# Patient Record
Sex: Female | Born: 1965 | Race: White | Hispanic: No | Marital: Single | Smoking: Former smoker
Health system: Southern US, Community
[De-identification: ages and names within clinical notes are randomized; demographics above are authoritative.]

## PROBLEM LIST (undated history)

## (undated) DIAGNOSIS — E785 Hyperlipidemia, unspecified: Secondary | ICD-10-CM

## (undated) DIAGNOSIS — IMO0001 Reserved for inherently not codable concepts without codable children: Secondary | ICD-10-CM

## (undated) DIAGNOSIS — M5412 Radiculopathy, cervical region: Secondary | ICD-10-CM

## (undated) DIAGNOSIS — K759 Inflammatory liver disease, unspecified: Secondary | ICD-10-CM

## (undated) DIAGNOSIS — E119 Type 2 diabetes mellitus without complications: Secondary | ICD-10-CM

## (undated) DIAGNOSIS — G510 Bell's palsy: Secondary | ICD-10-CM

## (undated) DIAGNOSIS — Z9081 Acquired absence of spleen: Secondary | ICD-10-CM

## (undated) DIAGNOSIS — F319 Bipolar disorder, unspecified: Secondary | ICD-10-CM

## (undated) DIAGNOSIS — G5603 Carpal tunnel syndrome, bilateral upper limbs: Secondary | ICD-10-CM

## (undated) DIAGNOSIS — Z972 Presence of dental prosthetic device (complete) (partial): Secondary | ICD-10-CM

## (undated) DIAGNOSIS — F32A Depression, unspecified: Secondary | ICD-10-CM

## (undated) DIAGNOSIS — J449 Chronic obstructive pulmonary disease, unspecified: Secondary | ICD-10-CM

## (undated) DIAGNOSIS — D649 Anemia, unspecified: Secondary | ICD-10-CM

## (undated) DIAGNOSIS — Z87442 Personal history of urinary calculi: Secondary | ICD-10-CM

## (undated) DIAGNOSIS — Z22322 Carrier or suspected carrier of Methicillin resistant Staphylococcus aureus: Secondary | ICD-10-CM

## (undated) DIAGNOSIS — G039 Meningitis, unspecified: Secondary | ICD-10-CM

## (undated) DIAGNOSIS — M542 Cervicalgia: Secondary | ICD-10-CM

## (undated) DIAGNOSIS — K219 Gastro-esophageal reflux disease without esophagitis: Secondary | ICD-10-CM

## (undated) DIAGNOSIS — F329 Major depressive disorder, single episode, unspecified: Secondary | ICD-10-CM

## (undated) DIAGNOSIS — F410 Panic disorder [episodic paroxysmal anxiety] without agoraphobia: Secondary | ICD-10-CM

## (undated) DIAGNOSIS — T8859XA Other complications of anesthesia, initial encounter: Secondary | ICD-10-CM

## (undated) DIAGNOSIS — E7800 Pure hypercholesterolemia, unspecified: Secondary | ICD-10-CM

## (undated) DIAGNOSIS — F419 Anxiety disorder, unspecified: Secondary | ICD-10-CM

## (undated) DIAGNOSIS — E079 Disorder of thyroid, unspecified: Secondary | ICD-10-CM

## (undated) DIAGNOSIS — M5430 Sciatica, unspecified side: Secondary | ICD-10-CM

## (undated) DIAGNOSIS — G8929 Other chronic pain: Secondary | ICD-10-CM

## (undated) DIAGNOSIS — G43909 Migraine, unspecified, not intractable, without status migrainosus: Secondary | ICD-10-CM

## (undated) DIAGNOSIS — M1990 Unspecified osteoarthritis, unspecified site: Secondary | ICD-10-CM

## (undated) DIAGNOSIS — N939 Abnormal uterine and vaginal bleeding, unspecified: Secondary | ICD-10-CM

## (undated) DIAGNOSIS — Q780 Osteogenesis imperfecta: Secondary | ICD-10-CM

## (undated) DIAGNOSIS — E039 Hypothyroidism, unspecified: Secondary | ICD-10-CM

## (undated) DIAGNOSIS — R011 Cardiac murmur, unspecified: Secondary | ICD-10-CM

## (undated) HISTORY — DX: Disorder of thyroid, unspecified: E07.9

## (undated) HISTORY — DX: Cervicalgia: M54.2

## (undated) HISTORY — DX: Pure hypercholesterolemia, unspecified: E78.00

## (undated) HISTORY — PX: OTHER SURGICAL HISTORY: SHX169

## (undated) HISTORY — DX: Osteogenesis imperfecta: Q78.0

## (undated) HISTORY — PX: TUBAL LIGATION: SHX77

## (undated) HISTORY — DX: Anxiety disorder, unspecified: F41.9

## (undated) HISTORY — DX: Carrier or suspected carrier of methicillin resistant Staphylococcus aureus: Z22.322

## (undated) HISTORY — DX: Abnormal uterine and vaginal bleeding, unspecified: N93.9

## (undated) HISTORY — PX: ABDOMINAL HYSTERECTOMY: SHX81

---
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
## 2007-12-27 HISTORY — PX: ANTERIOR FUSION CERVICAL SPINE: SUR626

## 2007-12-27 HISTORY — PX: NECK SURGERY: SHX720

## 2008-04-20 ENCOUNTER — Emergency Department: Payer: Self-pay | Admitting: Emergency Medicine

## 2008-06-17 ENCOUNTER — Emergency Department: Payer: Self-pay | Admitting: Internal Medicine

## 2009-03-10 ENCOUNTER — Emergency Department: Payer: Self-pay | Admitting: Internal Medicine

## 2009-05-03 ENCOUNTER — Emergency Department: Payer: Self-pay | Admitting: Unknown Physician Specialty

## 2009-11-19 ENCOUNTER — Emergency Department: Payer: Self-pay | Admitting: Emergency Medicine

## 2010-02-04 ENCOUNTER — Emergency Department: Payer: Self-pay | Admitting: Emergency Medicine

## 2010-02-07 ENCOUNTER — Inpatient Hospital Stay: Payer: Self-pay | Admitting: Internal Medicine

## 2010-07-01 ENCOUNTER — Inpatient Hospital Stay (HOSPITAL_COMMUNITY): Admission: AD | Admit: 2010-07-01 | Discharge: 2010-07-01 | Payer: Self-pay | Admitting: Obstetrics & Gynecology

## 2010-07-01 ENCOUNTER — Ambulatory Visit: Payer: Self-pay | Admitting: Gynecology

## 2010-10-09 ENCOUNTER — Emergency Department: Payer: Self-pay | Admitting: Unknown Physician Specialty

## 2011-01-27 ENCOUNTER — Emergency Department: Payer: Self-pay | Admitting: Internal Medicine

## 2011-03-13 LAB — URINE MICROSCOPIC-ADD ON

## 2011-03-13 LAB — URINALYSIS, ROUTINE W REFLEX MICROSCOPIC
Ketones, ur: NEGATIVE mg/dL
Leukocytes, UA: NEGATIVE
Nitrite: NEGATIVE
Specific Gravity, Urine: 1.02 (ref 1.005–1.030)
pH: 7 (ref 5.0–8.0)

## 2011-03-13 LAB — WET PREP, GENITAL

## 2011-03-13 LAB — GC/CHLAMYDIA PROBE AMP, GENITAL: GC Probe Amp, Genital: NEGATIVE

## 2011-07-31 ENCOUNTER — Emergency Department: Payer: Self-pay | Admitting: Emergency Medicine

## 2011-09-09 ENCOUNTER — Emergency Department (HOSPITAL_COMMUNITY): Payer: Self-pay

## 2011-09-09 ENCOUNTER — Emergency Department (HOSPITAL_COMMUNITY)
Admission: EM | Admit: 2011-09-09 | Discharge: 2011-09-09 | Disposition: A | Discharge status: Home / Self Care | Payer: Self-pay | Attending: Emergency Medicine | Admitting: Emergency Medicine

## 2011-09-09 DIAGNOSIS — M7989 Other specified soft tissue disorders: Secondary | ICD-10-CM | POA: Insufficient documentation

## 2011-09-09 DIAGNOSIS — S1093XA Contusion of unspecified part of neck, initial encounter: Secondary | ICD-10-CM | POA: Insufficient documentation

## 2011-09-09 DIAGNOSIS — Z79899 Other long term (current) drug therapy: Secondary | ICD-10-CM | POA: Insufficient documentation

## 2011-09-09 DIAGNOSIS — S0003XA Contusion of scalp, initial encounter: Secondary | ICD-10-CM | POA: Insufficient documentation

## 2011-09-09 DIAGNOSIS — S40029A Contusion of unspecified upper arm, initial encounter: Secondary | ICD-10-CM | POA: Insufficient documentation

## 2011-09-09 DIAGNOSIS — M79609 Pain in unspecified limb: Secondary | ICD-10-CM | POA: Insufficient documentation

## 2011-09-17 ENCOUNTER — Emergency Department (HOSPITAL_COMMUNITY): Payer: Self-pay

## 2011-09-17 ENCOUNTER — Emergency Department (HOSPITAL_COMMUNITY)
Admission: EM | Admit: 2011-09-17 | Discharge: 2011-09-17 | Disposition: A | Discharge status: Home / Self Care | Payer: Self-pay | Attending: Emergency Medicine | Admitting: Emergency Medicine

## 2011-09-17 DIAGNOSIS — F4310 Post-traumatic stress disorder, unspecified: Secondary | ICD-10-CM | POA: Insufficient documentation

## 2011-09-17 DIAGNOSIS — M542 Cervicalgia: Secondary | ICD-10-CM | POA: Insufficient documentation

## 2011-09-17 DIAGNOSIS — M5412 Radiculopathy, cervical region: Secondary | ICD-10-CM | POA: Insufficient documentation

## 2011-09-17 DIAGNOSIS — Z79899 Other long term (current) drug therapy: Secondary | ICD-10-CM | POA: Insufficient documentation

## 2011-09-17 DIAGNOSIS — M25529 Pain in unspecified elbow: Secondary | ICD-10-CM | POA: Insufficient documentation

## 2011-09-17 DIAGNOSIS — F319 Bipolar disorder, unspecified: Secondary | ICD-10-CM | POA: Insufficient documentation

## 2011-10-01 ENCOUNTER — Emergency Department (HOSPITAL_COMMUNITY)
Admission: EM | Admit: 2011-10-01 | Discharge: 2011-10-01 | Disposition: A | Discharge status: Home / Self Care | Payer: Self-pay | Attending: Emergency Medicine | Admitting: Emergency Medicine

## 2011-10-01 DIAGNOSIS — M542 Cervicalgia: Secondary | ICD-10-CM | POA: Insufficient documentation

## 2011-10-01 DIAGNOSIS — M79609 Pain in unspecified limb: Secondary | ICD-10-CM | POA: Insufficient documentation

## 2011-10-01 DIAGNOSIS — F319 Bipolar disorder, unspecified: Secondary | ICD-10-CM | POA: Insufficient documentation

## 2011-10-01 DIAGNOSIS — M5412 Radiculopathy, cervical region: Secondary | ICD-10-CM | POA: Insufficient documentation

## 2011-10-28 ENCOUNTER — Emergency Department (HOSPITAL_COMMUNITY)
Admission: EM | Admit: 2011-10-28 | Discharge: 2011-10-28 | Disposition: A | Discharge status: Home / Self Care | Payer: Self-pay | Attending: Emergency Medicine | Admitting: Emergency Medicine

## 2011-10-28 DIAGNOSIS — S139XXA Sprain of joints and ligaments of unspecified parts of neck, initial encounter: Secondary | ICD-10-CM | POA: Insufficient documentation

## 2011-10-28 DIAGNOSIS — M545 Low back pain, unspecified: Secondary | ICD-10-CM | POA: Insufficient documentation

## 2011-10-28 DIAGNOSIS — M79609 Pain in unspecified limb: Secondary | ICD-10-CM | POA: Insufficient documentation

## 2011-10-28 DIAGNOSIS — W108XXA Fall (on) (from) other stairs and steps, initial encounter: Secondary | ICD-10-CM | POA: Insufficient documentation

## 2011-10-28 DIAGNOSIS — S335XXA Sprain of ligaments of lumbar spine, initial encounter: Secondary | ICD-10-CM | POA: Insufficient documentation

## 2011-10-28 DIAGNOSIS — M542 Cervicalgia: Secondary | ICD-10-CM | POA: Insufficient documentation

## 2011-11-21 ENCOUNTER — Encounter: Payer: Self-pay | Admitting: Emergency Medicine

## 2011-11-21 ENCOUNTER — Emergency Department (HOSPITAL_COMMUNITY)
Admission: EM | Admit: 2011-11-21 | Discharge: 2011-11-21 | Disposition: A | Discharge status: Home / Self Care | Payer: Self-pay | Attending: Emergency Medicine | Admitting: Emergency Medicine

## 2011-11-21 ENCOUNTER — Emergency Department: Payer: Self-pay | Admitting: Emergency Medicine

## 2011-11-21 DIAGNOSIS — M545 Low back pain, unspecified: Secondary | ICD-10-CM | POA: Insufficient documentation

## 2011-11-21 DIAGNOSIS — Z79899 Other long term (current) drug therapy: Secondary | ICD-10-CM | POA: Insufficient documentation

## 2011-11-21 DIAGNOSIS — F17200 Nicotine dependence, unspecified, uncomplicated: Secondary | ICD-10-CM | POA: Insufficient documentation

## 2011-11-21 DIAGNOSIS — F3130 Bipolar disorder, current episode depressed, mild or moderate severity, unspecified: Secondary | ICD-10-CM | POA: Insufficient documentation

## 2011-11-21 DIAGNOSIS — M549 Dorsalgia, unspecified: Secondary | ICD-10-CM

## 2011-11-21 HISTORY — DX: Depression, unspecified: F32.A

## 2011-11-21 HISTORY — DX: Bipolar disorder, unspecified: F31.9

## 2011-11-21 HISTORY — DX: Major depressive disorder, single episode, unspecified: F32.9

## 2011-11-21 MED ORDER — KETOROLAC TROMETHAMINE 60 MG/2ML IM SOLN: 60.0000 mg | Freq: Once | INTRAMUSCULAR | Status: DC

## 2011-11-21 MED ORDER — KETOROLAC TROMETHAMINE 60 MG/2ML IM SOLN
60.0000 mg | Freq: Once | INTRAMUSCULAR | Status: DC
Start: 2011-11-21 — End: 2011-11-21
  Filled 2011-11-21: qty 2

## 2011-11-21 NOTE — ED Notes (Signed)
Had a yard sale 10 days ago and then sneezed and her back hurts now no other injury hurts to move

## 2011-11-21 NOTE — ED Provider Notes (Signed)
History     CSN: [id number] Arrival date & time: 11/21/2011 11:52 AM   First MD Initiated Contact with Patient 11/21/11 1350      Chief Complaint  Patient presents with  . Back Pain     Patient is a 45 y.o. female presenting with back pain. The history is provided by the patient.  Back Pain  This is a chronic problem. The current episode started more than 1 week ago. The problem occurs daily. The problem has not changed since onset.The pain is associated with twisting. The pain is present in the lumbar spine. The pain is moderate. The symptoms are aggravated by twisting and bending. Pertinent negatives include no fever, no numbness, no bowel incontinence, no bladder incontinence, no dysuria and no weakness. She has tried NSAIDs for the symptoms.  pt admits she has had this "a long time" No h/o back surgery  Past Medical History  Diagnosis Date  . Depression   . Bipolar 1 disorder     No past surgical history on file.  No family history on file.  History  Substance Use Topics  . Smoking status: Current Everyday Smoker  . Smokeless tobacco: Not on file  . Alcohol Use: Yes    OB History    Grav Para Term Preterm Abortions TAB SAB Ect Mult Living                  Review of Systems  Constitutional: Negative for fever.  Gastrointestinal: Negative for bowel incontinence.  Genitourinary: Negative for bladder incontinence and dysuria.  Musculoskeletal: Positive for back pain.  Neurological: Negative for weakness and numbness.  All other systems reviewed and are negative.    Allergies  Review of patient's allergies indicates no known allergies.  Home Medications   Current Outpatient Rx  Name Route Sig Dispense Refill  . CITALOPRAM HYDROBROMIDE 10 MG PO TABS Oral Take 10 mg by mouth daily.      Marland Kitchen GABAPENTIN 100 MG PO CAPS Oral Take 100 mg by mouth 3 (three) times daily.      . IBUPROFEN 200 MG PO TABS Oral Take 800 mg by mouth every 6 (six) hours as needed. For pain      . LAMOTRIGINE 25 MG PO TABS Oral Take 12.5 mg by mouth daily.      Carma Leaven M PLUS PO TABS Oral Take 1 tablet by mouth daily.        BP 123/83  Pulse 84  Temp(Src) 97.3 F (36.3 C) (Oral)  Resp 18  SpO2 96%  Physical Exam CONSTITUTIONAL: Well developed/well nourished HEAD AND FACE: Normocephalic/atraumatic EYES: EOMI/PERRL ENMT: Mucous membranes moist NECK: supple no meningeal signs SPINE:entire spine nontender, lumbar paraspinal tenderness, No bruising/crepitance/stepoffs noted to spine CV: S1/S2 noted, no murmurs/rubs/gallops noted LUNGS: Lungs are clear to auscultation bilaterally, no apparent distress ABDOMEN: soft, nontender, no rebound or guarding GU:no cva tenderness NEURO: Awake/alert,equal distal motor: hip flexion/knee flexion/extension, ankle dorsi/plantar flexion, great toe extension intact bilaterally,  no apparent sensory deficit in any dermatome. Pt is able to ambulate. EXTREMITIES: pulses normal, full ROM SKIN: warm, color normal PSYCH: no abnormalities of mood noted    ED Course  Procedures     1. Back pain       MDM  Nursing notes reviewed and considered in documentation  Advised f/u as outpatient for chronic pain         Joya Gaskins, MD 11/21/11 1946

## 2011-12-07 ENCOUNTER — Emergency Department: Payer: Self-pay | Admitting: *Deleted

## 2012-01-18 ENCOUNTER — Emergency Department: Payer: Self-pay | Admitting: Emergency Medicine

## 2012-11-12 DIAGNOSIS — Z0289 Encounter for other administrative examinations: Secondary | ICD-10-CM | POA: Insufficient documentation

## 2013-01-02 DIAGNOSIS — F329 Major depressive disorder, single episode, unspecified: Secondary | ICD-10-CM | POA: Insufficient documentation

## 2013-01-02 DIAGNOSIS — E0590 Thyrotoxicosis, unspecified without thyrotoxic crisis or storm: Secondary | ICD-10-CM | POA: Insufficient documentation

## 2013-03-18 ENCOUNTER — Emergency Department: Payer: Self-pay | Admitting: Emergency Medicine

## 2013-06-19 DIAGNOSIS — M5412 Radiculopathy, cervical region: Secondary | ICD-10-CM | POA: Insufficient documentation

## 2013-06-19 DIAGNOSIS — M542 Cervicalgia: Secondary | ICD-10-CM | POA: Insufficient documentation

## 2013-06-19 DIAGNOSIS — T7491XA Unspecified adult maltreatment, confirmed, initial encounter: Secondary | ICD-10-CM | POA: Insufficient documentation

## 2013-06-19 DIAGNOSIS — F319 Bipolar disorder, unspecified: Secondary | ICD-10-CM | POA: Insufficient documentation

## 2013-06-19 DIAGNOSIS — IMO0002 Reserved for concepts with insufficient information to code with codable children: Secondary | ICD-10-CM | POA: Insufficient documentation

## 2013-10-28 DIAGNOSIS — Z79891 Long term (current) use of opiate analgesic: Secondary | ICD-10-CM | POA: Insufficient documentation

## 2013-10-28 DIAGNOSIS — Z79899 Other long term (current) drug therapy: Secondary | ICD-10-CM | POA: Insufficient documentation

## 2014-09-28 ENCOUNTER — Emergency Department: Payer: Self-pay | Admitting: Emergency Medicine

## 2014-12-26 DIAGNOSIS — B1920 Unspecified viral hepatitis C without hepatic coma: Secondary | ICD-10-CM

## 2014-12-26 DIAGNOSIS — K759 Inflammatory liver disease, unspecified: Secondary | ICD-10-CM

## 2014-12-26 HISTORY — DX: Unspecified viral hepatitis C without hepatic coma: B19.20

## 2014-12-26 HISTORY — DX: Inflammatory liver disease, unspecified: K75.9

## 2015-02-27 ENCOUNTER — Other Ambulatory Visit: Payer: Self-pay | Admitting: Urgent Care

## 2015-03-16 ENCOUNTER — Ambulatory Visit: Payer: Self-pay | Admitting: Gastroenterology

## 2015-04-06 ENCOUNTER — Ambulatory Visit
Admit: 2015-04-06 | Disposition: A | Discharge status: Home / Self Care | Payer: Self-pay | Attending: Urgent Care | Admitting: Urgent Care

## 2015-04-07 ENCOUNTER — Ambulatory Visit
Admit: 2015-04-07 | Disposition: A | Discharge status: Home / Self Care | Payer: Self-pay | Attending: Urgent Care | Admitting: Urgent Care

## 2015-04-13 ENCOUNTER — Emergency Department
Admit: 2015-04-13 | Disposition: A | Discharge status: Home / Self Care | Payer: Self-pay | Admitting: Emergency Medicine

## 2015-04-13 LAB — WET PREP, GENITAL

## 2015-04-13 LAB — CBC
HCT: 43 % (ref 35.0–47.0)
HGB: 14.1 g/dL (ref 12.0–16.0)
MCH: 30.2 pg (ref 26.0–34.0)
MCHC: 32.7 g/dL (ref 32.0–36.0)
MCV: 92 fL (ref 80–100)
Platelet: 400 10*3/uL (ref 150–440)
RBC: 4.65 10*6/uL (ref 3.80–5.20)
RDW: 14.1 % (ref 11.5–14.5)
WBC: 17.4 10*3/uL — AB (ref 3.6–11.0)

## 2015-04-13 LAB — URINALYSIS, COMPLETE
Bilirubin,UR: NEGATIVE
Glucose,UR: NEGATIVE mg/dL (ref 0–75)
KETONE: NEGATIVE
Leukocyte Esterase: NEGATIVE
Nitrite: NEGATIVE
PH: 5 (ref 4.5–8.0)
PROTEIN: NEGATIVE
Specific Gravity: 1.024 (ref 1.003–1.030)

## 2015-04-13 LAB — COMPREHENSIVE METABOLIC PANEL
ALBUMIN: 4.3 g/dL
ALK PHOS: 94 U/L
ALT: 28 U/L
ANION GAP: 9 (ref 7–16)
BILIRUBIN TOTAL: 0.4 mg/dL
BUN: 16 mg/dL
Calcium, Total: 9.7 mg/dL
Chloride: 103 mmol/L
Co2: 25 mmol/L
Creatinine: 0.82 mg/dL
EGFR (African American): >60
EGFR (Non-African Amer.): >60
Glucose: 129 mg/dL — ABNORMAL HIGH
POTASSIUM: 3.7 mmol/L
SGOT(AST): 37 U/L
SODIUM: 137 mmol/L
TOTAL PROTEIN: 8 g/dL

## 2015-04-13 LAB — GC/CHLAMYDIA PROBE AMP

## 2015-04-13 LAB — LIPASE, BLOOD: LIPASE: 47 U/L

## 2015-04-21 ENCOUNTER — Other Ambulatory Visit
Admit: 2015-04-21 | Disposition: A | Discharge status: Home / Self Care | Payer: Self-pay | Attending: Urgent Care | Admitting: Urgent Care

## 2015-04-21 LAB — CBC WITH DIFFERENTIAL/PLATELET
BASOS ABS: 0.2 10*3/uL — AB (ref 0.0–0.1)
Basophil %: 1.3 %
EOS PCT: 4.8 %
Eosinophil #: 0.6 10*3/uL (ref 0.0–0.7)
HCT: 40.9 % (ref 35.0–47.0)
HGB: 13.3 g/dL (ref 12.0–16.0)
LYMPHS ABS: 4.3 10*3/uL — AB (ref 1.0–3.6)
Lymphocyte %: 34.2 %
MCH: 30.3 pg (ref 26.0–34.0)
MCHC: 32.5 g/dL (ref 32.0–36.0)
MCV: 93 fL (ref 80–100)
MONO ABS: 0.7 x10 3/mm (ref 0.2–0.9)
MONOS PCT: 5.5 %
NEUTROS ABS: 6.8 10*3/uL — AB (ref 1.4–6.5)
Neutrophil %: 54.2 %
Platelet: 339 10*3/uL (ref 150–440)
RBC: 4.39 10*6/uL (ref 3.80–5.20)
RDW: 14 % (ref 11.5–14.5)
WBC: 12.6 10*3/uL — AB (ref 3.6–11.0)

## 2015-05-15 ENCOUNTER — Other Ambulatory Visit: Payer: Self-pay | Admitting: Family Medicine

## 2015-05-18 ENCOUNTER — Other Ambulatory Visit: Payer: Self-pay | Admitting: *Deleted

## 2015-06-01 ENCOUNTER — Telehealth: Payer: Self-pay

## 2015-06-01 DIAGNOSIS — B1920 Unspecified viral hepatitis C without hepatic coma: Secondary | ICD-10-CM

## 2015-06-01 NOTE — Telephone Encounter (Signed)
Monica Terry called stating that the only thing that patient is able to get are Ribavirin and Sovaldi. Patients insurance (Medicaid) is only accepting these for Genotype 2 for 12 weeks. Please let me know if this is okay so I could call [facility] and they will ship tonight.

## 2015-06-02 NOTE — Telephone Encounter (Signed)
LVM to discuss with Patient.  If patient agrees, that is fine.  She will needs CBC @2wk , 4wk, then q4wks and LFTs q4wks, TSH @ 12wks, HCV RNA PCR @ week 12.  Follow up visit in 12 weeks after start. Thanks

## 2015-06-03 MED ORDER — SOFOSBUVIR 400 MG PO TABS: 400.0000 mg | ORAL_TABLET | Freq: Every day | ORAL | Status: DC

## 2015-06-03 MED ORDER — RIBAVIRIN 200 & 400 MG PO MISC: 600.0000 mg | Freq: Two times a day (BID) | ORAL | Status: DC

## 2015-06-03 NOTE — Telephone Encounter (Signed)
Called Monica Terry from [facility] to let him know the dosages. Monica Terry stated that they would ship her medications tonight.

## 2015-06-03 NOTE — Telephone Encounter (Signed)
I put med orders as phone in orders above

## 2015-06-03 NOTE — Telephone Encounter (Signed)
Called patient to let her know about her medications and she agreed on taking Ribavirin and Sovaldi. I also told her that she will be needing to have blood drawn very often and also agreed on doing. I then called [facility] Sherren Mocha) and told him that it was okay for patient taking the two medications that were recommended by Medicaid. However, I need the dosages. Please let me know what they are so I could call Todd back with that info. Thanks.

## 2015-06-09 ENCOUNTER — Telehealth: Payer: Self-pay | Admitting: Urgent Care

## 2015-06-09 NOTE — Telephone Encounter (Signed)
Called patient back after consulting with Vickey Huger about patient's question. After trying to answer patient's questions, Brennan Bailey wanted to speak to the patient. I then Kandice the phone and she told patient to call her PCP and ask if she should be referred to see a specialist before starting her Hep C treatment due to her concern. Patient then stated that she would contact her PCP and then will call us back with a response by tomorrow once she gets the okay from her PCP to start her Hep C treatment.

## 2015-06-09 NOTE — Telephone Encounter (Signed)
Pt has recieved Hep C medication through mail. Pt has concerns about taking medicaton due to the side effects for cardiac.

## 2015-06-15 ENCOUNTER — Telehealth: Payer: Self-pay | Admitting: Urgent Care

## 2015-06-15 NOTE — Telephone Encounter (Signed)
Called patient and had to leave her a message.  However, I will send patient her lab orders for her to keep up with having her labs drawn. Patient will have several labs drawn during her course of Hep C treatment. I labeled all labs individually for patient to have them organized. Please read below. CBC: 06/30/2015, 07/28/2015, 08/25/2015, 09/22/2015 Hepatic Function Panel: 07/14/2015, 08/11/2015, 09/08/2015 TSH: 09/08/2015 Hepatitis V VRS RNA PCR: 09/08/2015.

## 2015-06-15 NOTE — Telephone Encounter (Signed)
Patient wanted Herb Grays to know she spoke with Dr Brunetta Genera and he stated she could go ahead and take the medication, Ribavirin, so she will be starting that tomorrow (6/21)

## 2015-06-20 ENCOUNTER — Other Ambulatory Visit: Payer: Self-pay

## 2015-06-20 ENCOUNTER — Emergency Department
Admission: EM | Admit: 2015-06-20 | Discharge: 2015-06-20 | Disposition: A | Discharge status: Home / Self Care | Payer: Medicaid Other | Attending: Emergency Medicine | Admitting: Emergency Medicine

## 2015-06-20 ENCOUNTER — Emergency Department: Payer: Medicaid Other

## 2015-06-20 DIAGNOSIS — R079 Chest pain, unspecified: Secondary | ICD-10-CM | POA: Insufficient documentation

## 2015-06-20 DIAGNOSIS — Z720 Tobacco use: Secondary | ICD-10-CM | POA: Insufficient documentation

## 2015-06-20 DIAGNOSIS — M542 Cervicalgia: Secondary | ICD-10-CM | POA: Diagnosis not present

## 2015-06-20 DIAGNOSIS — F419 Anxiety disorder, unspecified: Secondary | ICD-10-CM | POA: Insufficient documentation

## 2015-06-20 DIAGNOSIS — R0602 Shortness of breath: Secondary | ICD-10-CM | POA: Diagnosis present

## 2015-06-20 DIAGNOSIS — F319 Bipolar disorder, unspecified: Secondary | ICD-10-CM | POA: Diagnosis not present

## 2015-06-20 DIAGNOSIS — F410 Panic disorder [episodic paroxysmal anxiety] without agoraphobia: Secondary | ICD-10-CM

## 2015-06-20 LAB — CBC WITH DIFFERENTIAL/PLATELET
Basophils Absolute: 0 10*3/uL (ref 0–0.1)
Basophils Relative: 0 %
EOS ABS: 0.4 10*3/uL (ref 0–0.7)
Eosinophils Relative: 2 %
HCT: 42.6 % (ref 35.0–47.0)
HEMOGLOBIN: 14 g/dL (ref 12.0–16.0)
LYMPHS ABS: 4.1 10*3/uL — AB (ref 1.0–3.6)
Lymphocytes Relative: 25 %
MCH: 30.4 pg (ref 26.0–34.0)
MCHC: 32.9 g/dL (ref 32.0–36.0)
MCV: 92.4 fL (ref 80.0–100.0)
Monocytes Absolute: 0.8 10*3/uL (ref 0.2–0.9)
Monocytes Relative: 5 %
Neutro Abs: 11.2 10*3/uL — ABNORMAL HIGH (ref 1.4–6.5)
Neutrophils Relative %: 68 %
PLATELETS: 341 10*3/uL (ref 150–440)
RBC: 4.61 MIL/uL (ref 3.80–5.20)
RDW: 14.7 % — AB (ref 11.5–14.5)
WBC: 16.5 10*3/uL — AB (ref 3.6–11.0)

## 2015-06-20 LAB — BASIC METABOLIC PANEL
Anion gap: 8 (ref 5–15)
BUN: 11 mg/dL (ref 6–20)
CO2: 22 mmol/L (ref 22–32)
Calcium: 9.5 mg/dL (ref 8.9–10.3)
Chloride: 107 mmol/L (ref 101–111)
Creatinine, Ser: 0.87 mg/dL (ref 0.44–1.00)
GFR calc non Af Amer: >60 mL/min (ref >60)
GLUCOSE: 115 mg/dL — AB (ref 65–99)
POTASSIUM: 4.1 mmol/L (ref 3.5–5.1)
Sodium: 137 mmol/L (ref 135–145)

## 2015-06-20 LAB — FIBRIN DERIVATIVES D-DIMER (ARMC ONLY): FIBRIN DERIVATIVES D-DIMER (ARMC): 443 (ref 0–499)

## 2015-06-20 LAB — TROPONIN I

## 2015-06-20 LAB — BRAIN NATRIURETIC PEPTIDE: B NATRIURETIC PEPTIDE 5: 31 pg/mL (ref 0.0–100.0)

## 2015-06-20 MED ORDER — DIAZEPAM 5 MG PO TABS: 5.0000 mg | ORAL_TABLET | Freq: Three times a day (TID) | ORAL | Status: DC | PRN

## 2015-06-20 MED ORDER — KETOROLAC TROMETHAMINE 30 MG/ML IJ SOLN
INTRAMUSCULAR | Status: AC
  Administered 2015-06-20: 30 mg via INTRAVENOUS
  Filled 2015-06-20: qty 1

## 2015-06-20 MED ORDER — KETOROLAC TROMETHAMINE 30 MG/ML IJ SOLN
30.0000 mg | Freq: Once | INTRAMUSCULAR | Status: AC
  Administered 2015-06-20: 30 mg via INTRAVENOUS

## 2015-06-20 MED ORDER — DIAZEPAM 5 MG/ML IJ SOLN
INTRAMUSCULAR | Status: AC
  Administered 2015-06-20: 5 mg via INTRAVENOUS
  Filled 2015-06-20: qty 2

## 2015-06-20 MED ORDER — DIAZEPAM 5 MG/ML IJ SOLN
5.0000 mg | Freq: Once | INTRAMUSCULAR | Status: AC
  Administered 2015-06-20: 5 mg via INTRAVENOUS

## 2015-06-20 NOTE — ED Notes (Signed)
Pt reports productive cough with clear sputum x 4 days that worsened last night. Pt became SOB and reports aching that goes down throat into chest and radiates into pts left shoulder.

## 2015-06-20 NOTE — ED Notes (Signed)
Pt returned from radiology. Pt resting in bed at this time. Bed locked and in lowest position. Call bell in reach.

## 2015-06-20 NOTE — Discharge Instructions (Signed)

## 2015-06-20 NOTE — ED Provider Notes (Signed)
[facility] Emergency Department Provider Note     Time seen: ----------------------------------------- 3:58 PM on 06/20/2015 -----------------------------------------   I have reviewed the triage vital signs and the nursing notes.   HISTORY  Chief Complaint Shortness of Breath and Cough    HPI Monica Terry is a 49 y.o. female who presents ER for shortness breath over the last 2 days. Patient states she's had some productive cough with clear sputum for 4 days, this is worsened since last night. Nothing seems to make it better or worse. She states she says aching goes from her neck down to her chest and radiates to the left shoulder. Denies fevers chills other complaints, has not had a history of this before.   Past Medical History  Diagnosis Date  . Depression   . Bipolar 1 disorder     There are no active problems to display for this patient.   No past surgical history on file.  Allergies Review of patient's allergies indicates no known allergies.  Social History History  Substance Use Topics  . Smoking status: Current Every Day Smoker  . Smokeless tobacco: Not on file  . Alcohol Use: Yes    Review of Systems Constitutional: Negative for fever. Eyes: Negative for visual changes. ENT: Negative for sore throat. Cardiovascular: Positive for chest pain positive for anterior neck pain bilaterally Respiratory: Positive shortness of breath Gastrointestinal: Negative for abdominal pain, vomiting and diarrhea. Genitourinary: Negative for dysuria. Musculoskeletal: Negative for back pain. Skin: Negative for rash. Neurological: Negative for headaches, focal weakness or numbness.  10-point ROS otherwise negative.  ____________________________________________   PHYSICAL EXAM:  VITAL SIGNS: ED Triage Vitals  Enc Vitals Group     BP 06/20/15 1545 114/77 mmHg     Pulse Rate 06/20/15 1540 77     Resp 06/20/15 1540 22     Temp 06/20/15  1545 97.7 F (36.5 C)     Temp src --      SpO2 06/20/15 1540 99 %     Weight 06/20/15 1540 200 lb (90.719 kg)     Height 06/20/15 1540 5\' 8"  (1.727 m)     Head Cir --      Peak Flow --      Pain Score 06/20/15 1543 9     Pain Loc --      Pain Edu? --      Excl. in [place]? --     Constitutional: Alert and oriented. Well appearing and in no distress. Patient hyperventilating, appears anxious Eyes: Conjunctivae are normal. PERRL. Normal extraocular movements. ENT   Head: Normocephalic and atraumatic.   Nose: No congestion/rhinnorhea.   Mouth/Throat: Mucous membranes are moist.   Neck: No stridor. Hematological/Lymphatic/Immunilogical: No cervical lymphadenopathy. Cardiovascular: Normal rate, regular rhythm. Normal and symmetric distal pulses are present in all extremities. No murmurs, rubs, or gallops. Respiratory: Normal respiratory effort without tachypnea nor retractions. Breath sounds are clear and equal bilaterally. No wheezes/rales/rhonchi. Gastrointestinal: Soft and nontender. No distention. No abdominal bruits. There is no CVA tenderness. Musculoskeletal: Nontender with normal range of motion in all extremities. No joint effusions.  No lower extremity tenderness nor edema. Neurologic:  Normal speech and language. No gross focal neurologic deficits are appreciated. Speech is normal. No gait instability. Skin:  Skin is warm, dry and intact. No rash noted. Psychiatric: Mood and affect are normal. Speech and behavior are normal. Patient exhibits appropriate insight and judgment. ____________________________________________  EKG: Interpreted by me. Sinus rhythm with normal axis normal intervals, no  evidence of hypertrophy or acute infarction. Rate is 79  ____________________________________________  ED COURSE:  Pertinent labs & imaging results that were available during my care of the patient were reviewed by me and considered in my medical decision making (see chart for  details). This appears to be an anxiety attack, patient will get IV Valium and Toradol. We'll check basic labs d-dimer ____________________________________________    LABS (pertinent positives/negatives)  Labs Reviewed  CBC WITH DIFFERENTIAL/PLATELET - Abnormal; Notable for the following:    WBC 16.5 (*)    RDW 14.7 (*)    Neutro Abs 11.2 (*)    Lymphs Abs 4.1 (*)    All other components within normal limits  BASIC METABOLIC PANEL - Abnormal; Notable for the following:    Glucose, Bld 115 (*)    All other components within normal limits  BRAIN NATRIURETIC PEPTIDE  TROPONIN I  FIBRIN DERIVATIVES D-DIMER ([facility] ONLY)    RADIOLOGY Images were viewed by me  Chest x-ray FINDINGS: The heart size appears normal. There is no pleural effusion or edema. No airspace consolidation identified.  IMPRESSION: 1. No acute cardiopulmonary abnormalities. ____________________________________________  FINAL ASSESSMENT AND PLAN  Dyspnea and anxiety  Plan: Received Valium and Toradol and feels better. She stable for discharge home on Valium to take as needed for symptoms. Advise over-the-counter cough and cold medicines as directed.   [name], MD   [name], MD 06/20/15 [phone]

## 2015-07-29 ENCOUNTER — Emergency Department: Payer: Medicaid Other

## 2015-07-29 ENCOUNTER — Emergency Department
Admission: EM | Admit: 2015-07-29 | Discharge: 2015-07-29 | Disposition: A | Discharge status: Home / Self Care | Payer: Medicaid Other | Attending: Emergency Medicine | Admitting: Emergency Medicine

## 2015-07-29 ENCOUNTER — Encounter: Payer: Self-pay | Admitting: Urgent Care

## 2015-07-29 DIAGNOSIS — Y998 Other external cause status: Secondary | ICD-10-CM | POA: Insufficient documentation

## 2015-07-29 DIAGNOSIS — Z720 Tobacco use: Secondary | ICD-10-CM | POA: Insufficient documentation

## 2015-07-29 DIAGNOSIS — M25512 Pain in left shoulder: Secondary | ICD-10-CM

## 2015-07-29 DIAGNOSIS — Z79899 Other long term (current) drug therapy: Secondary | ICD-10-CM | POA: Diagnosis not present

## 2015-07-29 DIAGNOSIS — Y9289 Other specified places as the place of occurrence of the external cause: Secondary | ICD-10-CM | POA: Diagnosis not present

## 2015-07-29 DIAGNOSIS — X58XXXA Exposure to other specified factors, initial encounter: Secondary | ICD-10-CM | POA: Insufficient documentation

## 2015-07-29 DIAGNOSIS — S4992XA Unspecified injury of left shoulder and upper arm, initial encounter: Secondary | ICD-10-CM | POA: Diagnosis not present

## 2015-07-29 DIAGNOSIS — Y9383 Activity, rough housing and horseplay: Secondary | ICD-10-CM | POA: Diagnosis not present

## 2015-07-29 HISTORY — DX: Chronic obstructive pulmonary disease, unspecified: J44.9

## 2015-07-29 HISTORY — DX: Sciatica, unspecified side: M54.30

## 2015-07-29 MED ORDER — OXYCODONE-ACETAMINOPHEN 5-325 MG PO TABS: 1.0000 | ORAL_TABLET | ORAL | Status: DC | PRN

## 2015-07-29 NOTE — ED Notes (Signed)
Patient presents with c/o LEFT shoulder pain. Patient reporting difficulties with abduction/aduction. Patient was horse playing x 3 days ago. Presents with sling and foam/towel immobilizers in place. (+) PMS noted distal to injury. Patient requesting a MRI.

## 2015-07-29 NOTE — Discharge Instructions (Signed)
Please seek medical attention for any high fevers, chest pain, shortness of breath, change in behavior, persistent vomiting, bloody stool or any other new or concerning symptoms.  Shoulder Pain The shoulder is the joint that connects your arms to your body. The bones that form the shoulder joint include the upper arm bone (humerus), the shoulder blade (scapula), and the collarbone (clavicle). The top of the humerus is shaped like a ball and fits into a rather flat socket on the scapula (glenoid cavity). A combination of muscles and strong, fibrous tissues that connect muscles to bones (tendons) support your shoulder joint and hold the ball in the socket. Small, fluid-filled sacs (bursae) are located in different areas of the joint. They act as cushions between the bones and the overlying soft tissues and help reduce friction between the gliding tendons and the bone as you move your arm. Your shoulder joint allows a wide range of motion in your arm. This range of motion allows you to do things like scratch your back or throw a ball. However, this range of motion also makes your shoulder more prone to pain from overuse and injury. Causes of shoulder pain can originate from both injury and overuse and usually can be grouped in the following four categories:  Redness, swelling, and pain (inflammation) of the tendon (tendinitis) or the bursae (bursitis).  Instability, such as a dislocation of the joint.  Inflammation of the joint (arthritis).  Broken bone (fracture). HOME CARE INSTRUCTIONS   Apply ice to the sore area.  Put ice in a plastic bag.  Place a towel between your skin and the bag.  Leave the ice on for 15-20 minutes, 3-4 times per day for the first 2 days, or as directed by your health care provider.  Stop using cold packs if they do not help with the pain.  If you have a shoulder sling or immobilizer, wear it as long as your caregiver instructs. Only remove it to shower or bathe. Move  your arm as little as possible, but keep your hand moving to prevent swelling.  Squeeze a soft ball or foam pad as much as possible to help prevent swelling.  Only take over-the-counter or prescription medicines for pain, discomfort, or fever as directed by your caregiver. SEEK MEDICAL CARE IF:   Your shoulder pain increases, or new pain develops in your arm, hand, or fingers.  Your hand or fingers become cold and numb.  Your pain is not relieved with medicines. SEEK IMMEDIATE MEDICAL CARE IF:   Your arm, hand, or fingers are numb or tingling.  Your arm, hand, or fingers are significantly swollen or turn white or blue. MAKE SURE YOU:   Understand these instructions.  Will watch your condition.  Will get help right away if you are not doing well or get worse. Document Released: 09/21/2005 Document Revised: 04/28/2014 Document Reviewed: 11/26/2011 [facility] Patient Information 2015 [place], [place]. This information is not intended to replace advice given to you by your health care provider. Make sure you discuss any questions you have with your health care provider.

## 2015-07-29 NOTE — ED Provider Notes (Signed)
[facility] Emergency Department Provider Note   ____________________________________________  Time seen: 2150  I have reviewed the triage vital signs and the nursing notes.   HISTORY  Chief Complaint Shoulder Pain   History limited by: Not Limited   HPI Monica Terry is a 49 y.o. female who presents to the emergency department today because of left shoulder pain. She states she heard a few days ago when she was horsing around with her boyfriend. She is unsure exactly how it got twisted but she had sudden onset of pain. She did not have a popping sensation. She states that since then she has had severe pain to the left shoulder. It is somewhat alleviated by putting it in a splinting and elevating her way from the body. She has had some tingling in her fingers. She denies any other injuries.   Past Medical History  Diagnosis Date  . Depression   . Bipolar 1 disorder   . COPD (chronic obstructive pulmonary disease)   . Sciatica     There are no active problems to display for this patient.   Past Surgical History  Procedure Laterality Date  . Neck surgery      Current Outpatient Rx  Name  Route  Sig  Dispense  Refill  . citalopram (CELEXA) 10 MG tablet   Oral   Take 10 mg by mouth daily.           . diazepam (VALIUM) 5 MG tablet   Oral   Take 1 tablet (5 mg total) by mouth every 8 (eight) hours as needed for anxiety.   20 tablet   0   . gabapentin (NEURONTIN) 100 MG capsule   Oral   Take 100 mg by mouth 3 (three) times daily.           Marland Kitchen ibuprofen (ADVIL,MOTRIN) 200 MG tablet   Oral   Take 800 mg by mouth every 6 (six) hours as needed. For pain          . lamoTRIgine (LAMICTAL) 25 MG tablet   Oral   Take 12.5 mg by mouth daily.           . Multiple Vitamins-Minerals (MULTIVITAMINS THER. W/MINERALS) TABS   Oral   Take 1 tablet by mouth daily.           . Ribavirin 200 & 400 MG MISC   Oral   Take 600 mg by mouth 2 (two)  times daily.   30 each   2   . Sofosbuvir (SOVALDI) 400 MG TABS   Oral   Take 400 mg by mouth daily.   30 tablet   2     Allergies Review of patient's allergies indicates no known allergies.  No family history on file.  Social History History  Substance Use Topics  . Smoking status: Current Every Day Smoker  . Smokeless tobacco: Not on file  . Alcohol Use: Yes    Review of Systems  Constitutional: Negative for fever. Cardiovascular: Negative for chest pain. Respiratory: Negative for shortness of breath. Gastrointestinal: Negative for abdominal pain, vomiting and diarrhea. Genitourinary: Negative for dysuria. Musculoskeletal: Negative for back pain. Left shoulder pain Skin: Negative for rash. Neurological: Negative for headaches, focal weakness or numbness.   10-point ROS otherwise negative.  ____________________________________________   PHYSICAL EXAM:  VITAL SIGNS: ED Triage Vitals  Enc Vitals Group     BP 07/29/15 2101 154/86 mmHg     Pulse Rate 07/29/15 2101 93  Resp 07/29/15 2101 20     Temp 07/29/15 2101 98 F (36.7 C)     Temp src --      SpO2 07/29/15 2101 93 %     Weight 07/29/15 2101 214 lb (97.07 kg)     Height 07/29/15 2101 5\' 8"  (1.727 m)     Head Cir --      Peak Flow --      Pain Score 07/29/15 2102 9   Constitutional: Alert and oriented. Well appearing and in no distress. Eyes: Conjunctivae are normal. PERRL. Normal extraocular movements. ENT   Head: Normocephalic and atraumatic.   Nose: No congestion/rhinnorhea.   Mouth/Throat: Mucous membranes are moist.   Neck: No stridor. Cardiovascular: Normal rate, regular rhythm.  No murmurs, rubs, or gallops. Respiratory: Normal respiratory effort without tachypnea nor retractions. Breath sounds are clear and equal bilaterally. No wheezes/rales/rhonchi. Genitourinary: Deferred Musculoskeletal: Left shoulder with some decreased range of motion secondary to pain. Tender to  palpation over the shoulder, trap and proximal deltoid. No deltoid numbness. Neurologic:  Normal speech and language. No gross focal neurologic deficits are appreciated. Speech is normal.  Skin:  Skin is warm, dry and intact. No rash noted. Psychiatric: Mood and affect are normal. Speech and behavior are normal. Patient exhibits appropriate insight and judgment.  ____________________________________________    LABS (pertinent positives/negatives)  None  ____________________________________________   EKG  None  ____________________________________________    RADIOLOGY  Shoulder left IMPRESSION: No acute osseous abnormality.  Ossific density adjacent to the rotator cuff insertion may represent calcific tendinitis.  ____________________________________________   PROCEDURES  Procedure(s) performed: None  Critical Care performed: No  ____________________________________________   INITIAL IMPRESSION / ASSESSMENT AND PLAN / ED COURSE  Pertinent labs & imaging results that were available during my care of the patient were reviewed by me and considered in my medical decision making (see chart for details).  Patient presents to the emergency department today with left shoulder pain. X-rays without any osseous injury. Think likely patient suffering from a rotator cuff injury. Will give pain medication and have patient follow-up with orthopedics.  ____________________________________________   FINAL CLINICAL IMPRESSION(S) / ED DIAGNOSES  Final diagnoses:  Left shoulder pain     Nance Pear, MD 07/29/15 2257

## 2015-08-17 ENCOUNTER — Other Ambulatory Visit: Payer: Self-pay | Admitting: Gastroenterology

## 2015-08-17 ENCOUNTER — Other Ambulatory Visit: Payer: Self-pay

## 2015-08-17 DIAGNOSIS — B182 Chronic viral hepatitis C: Secondary | ICD-10-CM

## 2015-08-17 NOTE — Telephone Encounter (Signed)
Patient is down to 2 days on her HEP C medication and is needing a refill. She called the pharmacy for a refill, but they state she needs a prior authorization. Please call and advise.

## 2015-08-17 NOTE — Telephone Encounter (Signed)
LVM for pt to return my call.

## 2015-08-18 LAB — HEPATIC FUNCTION PANEL
ALT: 16 IU/L (ref 0–32)
AST: 14 IU/L (ref 0–40)
Albumin: 4.3 g/dL (ref 3.5–5.5)
Alkaline Phosphatase: 84 IU/L (ref 39–117)
Bilirubin Total: 0.5 mg/dL (ref 0.0–1.2)
Bilirubin, Direct: 0.15 mg/dL (ref 0.00–0.40)
Total Protein: 7.1 g/dL (ref 6.0–8.5)

## 2015-08-18 NOTE — Telephone Encounter (Signed)
Pt returned call. Informed her she was suppose to have labs done early August. Pt never went to [place]. Informed pt her insurance company will not approve another refill until these are done. Pt has not been compliant with her care. Emergency refill was given last month due to pt not having labs completed on time. Pt stated she would go yesterday to have these done.

## 2015-08-27 ENCOUNTER — Telehealth: Payer: Self-pay

## 2015-08-27 NOTE — Telephone Encounter (Signed)
Carmen from [facility] called requesting for lab results (Hepatic Function Panel and HCV RNA Quant). I told her that we only had results from Hepatic Function Panel but for some reason, we didn't have results for HCV RNA. I told her that I would call [facility] and ask for results. Asencion Partridge asked for me to fax her lab results.

## 2015-08-27 NOTE — Telephone Encounter (Signed)
Called [facility] and they stated that the only thing they received to be drawn from patient was a Hepatic Function Panel and a CBC. I told them that we didn't order a CBC but a HCV RNA. [facility] representative told me that she would fax me results of what was drawn. I then gave her our fax number [phone].

## 2015-08-27 NOTE — Telephone Encounter (Signed)
Called patient at [phone] and she did not answer. I left her a voicemail for her to return my call.  I need to tell patient that we are needing a HCV RNA Quantitive in order for her to receive her prescription from [facility].

## 2015-08-28 ENCOUNTER — Telehealth: Payer: Self-pay | Admitting: Gastroenterology

## 2015-08-28 NOTE — Telephone Encounter (Signed)
Patient needs a refill on her medications for HEP-C. Patient states [facility] hasn't filled the prescription because they are still waiting on results? Patient hasn't taken her meds in 1 week now. Please call and advise.

## 2015-08-28 NOTE — Telephone Encounter (Signed)
Called patient again and had to leave a voicemail to return my call.  If she calls back, she needs to go back to [facility] and have her HCV RNA drawn in order for her to get her medication by [facility].

## 2015-09-02 NOTE — Telephone Encounter (Signed)
LVM for pt to return my call asap.

## 2015-09-08 ENCOUNTER — Other Ambulatory Visit: Payer: Self-pay

## 2015-09-08 NOTE — Telephone Encounter (Signed)
Patient was out of town, she is calling back to check on the status of her medication refill for Hep-C. Please call and advise

## 2015-09-09 LAB — CBC WITH DIFFERENTIAL/PLATELET
BASOS ABS: 0 10*3/uL (ref 0.0–0.2)
Basos: 0 %
EOS (ABSOLUTE): 0.4 10*3/uL (ref 0.0–0.4)
Eos: 3 %
Hematocrit: 43.6 % (ref 34.0–46.6)
Hemoglobin: 14 g/dL (ref 11.1–15.9)
Immature Grans (Abs): 0 10*3/uL (ref 0.0–0.1)
Immature Granulocytes: 0 %
LYMPHS ABS: 4.4 10*3/uL — AB (ref 0.7–3.1)
Lymphs: 34 %
MCH: 30.6 pg (ref 26.6–33.0)
MCHC: 32.1 g/dL (ref 31.5–35.7)
MCV: 95 fL (ref 79–97)
Monocytes Absolute: 0.6 10*3/uL (ref 0.1–0.9)
Monocytes: 5 %
NEUTROS ABS: 7.7 10*3/uL — AB (ref 1.4–7.0)
Neutrophils: 58 %
PLATELETS: 455 10*3/uL — AB (ref 150–379)
RBC: 4.57 x10E6/uL (ref 3.77–5.28)
RDW: 13.3 % (ref 12.3–15.4)
WBC: 13.1 10*3/uL — ABNORMAL HIGH (ref 3.4–10.8)

## 2015-09-10 ENCOUNTER — Telehealth: Payer: Self-pay

## 2015-09-10 NOTE — Telephone Encounter (Signed)
Patient called stating that she needs her medication refilled. I told her that we are needing her HCV RNA drawn and she stated that she had the paperwork so she will go to [facility] and had it drawn tomorrow. I told her that I soon as I get her results, I would fax it to [facility]. After they receive results, they would mail her medication. Patient understood.

## 2015-09-11 ENCOUNTER — Other Ambulatory Visit: Payer: Self-pay

## 2015-09-11 NOTE — Telephone Encounter (Signed)
LVM for pt to call and schedule a follow up appt with Dr. Allen Norris. Pt has been without her HEP C medication over 2 weeks due to compliance issues.

## 2015-09-12 LAB — HEPATIC FUNCTION PANEL
ALT: 18 IU/L (ref 0–32)
AST: 20 IU/L (ref 0–40)
Albumin: 4.2 g/dL (ref 3.5–5.5)
Alkaline Phosphatase: 75 IU/L (ref 39–117)
BILIRUBIN, DIRECT: 0.07 mg/dL (ref 0.00–0.40)
TOTAL PROTEIN: 7 g/dL (ref 6.0–8.5)

## 2015-09-12 LAB — TSH: TSH: 1.02 u[IU]/mL (ref 0.450–4.500)

## 2015-09-13 LAB — HEPATITIS C VRS RNA DETECT BY PCR-QUAL: HCV RNA NAA Qualitative: NEGATIVE

## 2015-10-06 ENCOUNTER — Telehealth: Payer: Self-pay | Admitting: Gastroenterology

## 2015-10-06 NOTE — Telephone Encounter (Signed)
Sharyn Lull, will you call her back and reschedule her for an appt. She can not restart treatment at this time. She will just need a follow up. Thanks!!

## 2015-10-06 NOTE — Telephone Encounter (Signed)
Patient called about her Hep C treatment, per patient she went out of town and did not continue treatment and now wants to start it again, Does she need ,labs,Another appt? Please let patient know what she needs to do at this time

## 2015-10-06 NOTE — Telephone Encounter (Signed)
Phoned patient Appt with Dr.Wohl 11/17

## 2015-11-12 ENCOUNTER — Ambulatory Visit: Payer: Self-pay | Admitting: Gastroenterology

## 2015-12-04 ENCOUNTER — Other Ambulatory Visit: Payer: Self-pay

## 2015-12-04 DIAGNOSIS — M961 Postlaminectomy syndrome, not elsewhere classified: Secondary | ICD-10-CM | POA: Insufficient documentation

## 2015-12-09 ENCOUNTER — Ambulatory Visit: Payer: Self-pay | Admitting: Gastroenterology

## 2016-01-07 ENCOUNTER — Emergency Department
Admission: EM | Admit: 2016-01-07 | Discharge: 2016-01-07 | Disposition: A | Discharge status: Home / Self Care | Payer: Medicaid Other | Attending: Emergency Medicine | Admitting: Emergency Medicine

## 2016-01-07 ENCOUNTER — Encounter: Payer: Self-pay | Admitting: *Deleted

## 2016-01-07 DIAGNOSIS — F419 Anxiety disorder, unspecified: Secondary | ICD-10-CM | POA: Insufficient documentation

## 2016-01-07 DIAGNOSIS — F17200 Nicotine dependence, unspecified, uncomplicated: Secondary | ICD-10-CM | POA: Diagnosis not present

## 2016-01-07 DIAGNOSIS — N939 Abnormal uterine and vaginal bleeding, unspecified: Secondary | ICD-10-CM | POA: Diagnosis present

## 2016-01-07 DIAGNOSIS — Z7951 Long term (current) use of inhaled steroids: Secondary | ICD-10-CM | POA: Diagnosis not present

## 2016-01-07 DIAGNOSIS — R1013 Epigastric pain: Secondary | ICD-10-CM | POA: Diagnosis not present

## 2016-01-07 DIAGNOSIS — Z79899 Other long term (current) drug therapy: Secondary | ICD-10-CM | POA: Diagnosis not present

## 2016-01-07 LAB — CBC
HCT: 35 % (ref 35.0–47.0)
Hemoglobin: 11.6 g/dL — ABNORMAL LOW (ref 12.0–16.0)
MCH: 31.3 pg (ref 26.0–34.0)
MCHC: 33.2 g/dL (ref 32.0–36.0)
MCV: 94.4 fL (ref 80.0–100.0)
PLATELETS: 343 10*3/uL (ref 150–440)
RBC: 3.71 MIL/uL — ABNORMAL LOW (ref 3.80–5.20)
RDW: 15.1 % — AB (ref 11.5–14.5)
WBC: 13.4 10*3/uL — ABNORMAL HIGH (ref 3.6–11.0)

## 2016-01-07 MED ORDER — LORAZEPAM 1 MG PO TABS: 1.0000 mg | ORAL_TABLET | Freq: Three times a day (TID) | ORAL | Status: DC | PRN

## 2016-01-07 MED ORDER — MEDROXYPROGESTERONE ACETATE 10 MG PO TABS: 10.0000 mg | ORAL_TABLET | Freq: Every day | ORAL | Status: DC

## 2016-01-07 NOTE — Discharge Instructions (Signed)
Abnormal Uterine Bleeding Abnormal uterine bleeding means bleeding from the vagina that is not your normal menstrual period. This can be:  Bleeding or spotting between periods.  Bleeding after sex (sexual intercourse).  Bleeding that is heavier or more than normal.  Periods that last longer than usual.  Bleeding after menopause. There are many problems that may cause this. Treatment will depend on the cause of the bleeding. Any kind of bleeding that is not normal should be reviewed by your doctor.  HOME CARE Watch your condition for any changes. These actions may lessen any discomfort you are having:  Do not use tampons or douches as told by your doctor.  Change your pads often. You should get regular pelvic exams and Pap tests. Keep all appointments for tests as told by your doctor. GET HELP IF:  You are bleeding for more than 1 week.  You feel dizzy at times. GET HELP RIGHT AWAY IF:   You pass out.  You have to change pads every 15 to 30 minutes.  You have belly pain.  You have a fever.  You become sweaty or weak.  You are passing large blood clots from the vagina.  You feel sick to your stomach (nauseous) and throw up (vomit). MAKE SURE YOU:  Understand these instructions.  Will watch your condition.  Will get help right away if you are not doing well or get worse.   This information is not intended to replace advice given to you by your health care provider. Make sure you discuss any questions you have with your health care provider.   Document Released: 10/09/2009 Document Revised: 12/17/2013 Document Reviewed: 07/11/2013 Elsevier Interactive Patient Education [employer].  Please return immediately if condition worsens. Please contact her primary physician or the physician you were given for referral. If you have any specialist physicians involved in her treatment and plan please also contact them. Thank you for using [facility]. Please take over-the-counter iron, vitamin B12 (oral multivitamin), ibuprofen for pain Return especially for vaginal bleeding of one to 24 pads per hour for 4 consecutive hours, pass out, persistent lightheadedness, fever, or any other new concerns

## 2016-01-07 NOTE — ED Notes (Signed)
States 4 days ago she started her period and has had heavy bleeding since, state she is passing clots, going through lots of tampons, states she woke up this AM lying in blood, pt awake and alert in no acucte distress
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
## 2016-01-12 ENCOUNTER — Encounter: Payer: Self-pay | Admitting: Obstetrics and Gynecology

## 2016-01-12 ENCOUNTER — Ambulatory Visit (INDEPENDENT_AMBULATORY_CARE_PROVIDER_SITE_OTHER): Payer: Medicaid Other | Admitting: Obstetrics and Gynecology

## 2016-01-12 VITALS — BP 111/69 | HR 80 | Ht 68.0 in | Wt 208.8 lb

## 2016-01-12 DIAGNOSIS — N939 Abnormal uterine and vaginal bleeding, unspecified: Secondary | ICD-10-CM

## 2016-01-12 DIAGNOSIS — N941 Unspecified dyspareunia: Secondary | ICD-10-CM | POA: Diagnosis not present

## 2016-01-12 DIAGNOSIS — N946 Dysmenorrhea, unspecified: Secondary | ICD-10-CM | POA: Diagnosis not present

## 2016-01-12 MED ORDER — MEDROXYPROGESTERONE ACETATE 10 MG PO TABS: 10.0000 mg | ORAL_TABLET | Freq: Every day | ORAL | Status: DC

## 2016-01-12 NOTE — Addendum Note (Signed)
Addended by: Elouise Munroe on: 01/12/2016 03:56 PM   Modules accepted: Orders

## 2016-01-12 NOTE — Progress Notes (Signed)
GYN ENCOUNTER NOTE  Subjective:       Monica Terry is a 50 y.o. para 44,  female is here for gynecologic evaluation of the following issues:  1. Menorrhagia(emergency room follow-up).  Patient resolved.  Abnormal uterine bleeding 10 days after her last normal menstrual cycle.  The bleeding was extremely heavy with large clots and cramping.  She was seen in the emergency room; CBC was normal; patient was placed on Provera 10 mg a day and told to follow-up with OB/GYN.  Her bleeding has since subsided.   Gynecologic History Menarche-age 28. Intervals.-Monthly. Duration of flow-7 days; heavy clots, requiring ultra tampons and pads to control soiling. History of anemia due to her heavy periods. History of severe dysmenorrhea; 7-8 out of 10 in intensity; patient may use upwards of 16.  Ibuprofen today for controlling cramping; patient has cramps that occur outside of her cycles as well.  She does have low back pain without radiation. Long history of deep thrusting dyspareunia; she no longer has intercourse due to the pain.  Patient's last menstrual period was 01/07/2016. Contraception: Abstinence  Obstetric History Para 3013 SVD 3; largest infant 6 lbs. 10 oz., SAB 1  Past Medical History  Diagnosis Date  . Depression   . Bipolar 1 disorder ([place])   . COPD (chronic obstructive pulmonary disease) ([place])   . Sciatica     Past Surgical History  Procedure Laterality Date  . Neck surgery      Current Outpatient Prescriptions on File Prior to Visit  Medication Sig Dispense Refill  . albuterol (PROAIR HFA) 108 (90 BASE) MCG/ACT inhaler Inhale into the lungs.    . cetirizine (ZYRTEC) 10 MG chewable tablet Chew 10 mg by mouth.    . citalopram (CELEXA) 10 MG tablet Take 10 mg by mouth daily.      . diazepam (VALIUM) 5 MG tablet Take 1 tablet (5 mg total) by mouth every 8 (eight) hours as needed for anxiety. 20 tablet 0  . gabapentin (NEURONTIN) 100 MG capsule Take 100 mg by mouth 3  (three) times daily.      Marland Kitchen ibuprofen (ADVIL,MOTRIN) 200 MG tablet Take 800 mg by mouth every 6 (six) hours as needed. For pain     . lamoTRIgine (LAMICTAL) 25 MG tablet Take 12.5 mg by mouth daily.      Marland Kitchen levothyroxine (SYNTHROID, LEVOTHROID) 100 MCG tablet Take by mouth.    Marland Kitchen LORazepam (ATIVAN) 1 MG tablet Take 1 tablet (1 mg total) by mouth every 8 (eight) hours as needed for anxiety. 12 tablet 0  . lurasidone (LATUDA) 80 MG TABS tablet Take 80 mg by mouth.    . medroxyPROGESTERone (PROVERA) 10 MG tablet Take 1 tablet (10 mg total) by mouth daily. 10 tablet 0  . Multiple Vitamins-Minerals (MULTIVITAMINS THER. W/MINERALS) TABS Take 1 tablet by mouth daily.      Marland Kitchen oxyCODONE-acetaminophen (ROXICET) 5-325 MG per tablet Take 1 tablet by mouth every 4 (four) hours as needed for severe pain. 12 tablet 0  . Ribavirin 200 & 400 MG MISC Take 600 mg by mouth 2 (two) times daily. 30 each 2  . simvastatin (ZOCOR) 20 MG tablet Take 20 mg by mouth.    . Sofosbuvir (SOVALDI) 400 MG TABS Take 400 mg by mouth daily. 30 tablet 2  . tiZANidine (ZANAFLEX) 4 MG tablet Take 4 mg by mouth.     No current facility-administered medications on file prior to visit.    No Known Allergies  Social  History   Social History  . Marital Status: Single    Spouse Name: N/A  . Number of Children: N/A  . Years of Education: N/A   Occupational History  . Not on file.   Social History Main Topics  . Smoking status: Current Every Day Smoker  . Smokeless tobacco: Not on file  . Alcohol Use: Yes  . Drug Use: Not on file  . Sexual Activity: Not on file   Other Topics Concern  . Not on file   Social History Narrative    No family history on file.  The following portions of the patient's history were reviewed and updated as appropriate: allergies, current medications, past family history, past medical history, past social history, past surgical history and problem list.  Review of Systems Review of Systems -  General ROS: negative for - chills, fatigue, fever, hot flashes, malaise or night sweats Hematological and Lymphatic ROS: negative for - bleeding problems or swollen lymph nodes Gastrointestinal ROS: negative for - abdominal pain, blood in stools, change in bowel habits and nausea/vomiting Musculoskeletal ROS: negative for - joint pain, muscle pain or muscular weakness Genito-Urinary ROS: negative for - change in menstrual cycle, ,  dysuria, genital discharge, genital ulcers, hematuria,  Nocturia. POSITIVE-incontinence, irregular/heavy menses, dysmenorrhea,  dyspareunia Objective:   LMP 01/07/2016 CONSTITUTIONAL: Well-developed, well-nourished female in no acute distress.  HENT:  Normocephalic, atraumatic.  NECK: Normal range of motion, supple, no masses.  Normal thyroid.  SKIN: Skin is warm and dry. No rash noted. Not diaphoretic. No erythema. No pallor. [place]: Alert and oriented to person, place, and time. PSYCHIATRIC: Normal mood and affect. Normal behavior. Normal judgment and thought content. CARDIOVASCULAR:Not Examined RESPIRATORY: Not Examined BREASTS: Not Examined ABDOMEN: Soft, non distended; Non tender.  No Organomegaly. PELVIC:  External Genitalia: Normal  BUS: Normal  Vagina: Normal  Cervix: Normal; Parous; no cervical motion tenderness   Uterus:Anteverted, top normal size, mobile, nontender  Adnexa: Normal  RV: Normal   Bladder: Nontender MUSCULOSKELETAL: Normal range of motion. No tenderness.  No cyanosis, clubbing, or edema.     Assessment:   1.  Abnormal uterine bleeding 2.  Severe dysmenorrhea. 3.  Deep thrusting dyspareunia, precluding relations. 4.  History of anemia secondary to menorrhagia. 5.  No history of abnormal Pap smears; last Pap smear 2002  Plan:   1.  Endometrial biopsy. 2.  Pelvic ultrasound. 3.  Continue with Provera 10 mg daily for the next 30 days  Endometrial Biopsy Procedure Note  Pre-operative Diagnosis: Abnormal uterine  bleeding (menorrhagia)  Post-operative Diagnosis: Same as above  Procedure Details   Urine pregnancy test was not done.  The risks (including infection, bleeding, pain, and uterine perforation) and benefits of the procedure were explained to the patient and Verbal informed consent was obtained.  Antibiotic prophylaxis against endocarditis was not indicated.   The patient was placed in the dorsal lithotomy position.  Bimanual exam showed the uterus to be in the anteroflexed position.  A Graves' speculum inserted in the vagina, and the cervix prepped with povidone iodine.  Endocervical curettage with a Kevorkian curette was not performed.   A sharp tenaculum was not applied to the anterior lip of the cervix for stabilization.  A sterile uterine sound was used to sound the uterus to a depth of 10cm.  A Mylex 70mm curette was used to sample the endometrium.  Sample was sent for pathologic examination.  Condition: Stable  Complications: None  Plan:  The patient was advised to  call for any fever or for prolonged or severe pain or bleeding. She was advised to use OTC acetaminophen and OTC ibuprofen as needed for mild to moderate pain. She was advised to avoid vaginal intercourse for 48 hours or until the bleeding has completely stopped.  Attending Physician Documentation: Brayton Mars, MD

## 2016-01-12 NOTE — Patient Instructions (Addendum)
1.  Ultrasound is scheduled. 2.  Return in 2 weeks for follow-up on ultrasound and endometrial biopsy. 3.  Endometrial biopsy was completed today.  You may take Advil 3 or 4 tablets 3 times a day and Tylenol extra strength 2 tablets four times a day for pain.

## 2016-01-13 ENCOUNTER — Ambulatory Visit (INDEPENDENT_AMBULATORY_CARE_PROVIDER_SITE_OTHER): Payer: Medicaid Other

## 2016-01-13 DIAGNOSIS — N939 Abnormal uterine and vaginal bleeding, unspecified: Secondary | ICD-10-CM

## 2016-01-14 ENCOUNTER — Ambulatory Visit: Payer: Self-pay | Admitting: Gastroenterology

## 2016-01-15 LAB — PATHOLOGY

## 2016-01-26 ENCOUNTER — Ambulatory Visit (INDEPENDENT_AMBULATORY_CARE_PROVIDER_SITE_OTHER): Payer: Medicaid Other | Admitting: Obstetrics and Gynecology

## 2016-01-26 ENCOUNTER — Encounter: Payer: Self-pay | Admitting: Obstetrics and Gynecology

## 2016-01-26 VITALS — BP 115/71 | HR 76 | Ht 68.0 in | Wt 203.0 lb

## 2016-01-26 DIAGNOSIS — N840 Polyp of corpus uteri: Secondary | ICD-10-CM | POA: Insufficient documentation

## 2016-01-26 DIAGNOSIS — N939 Abnormal uterine and vaginal bleeding, unspecified: Secondary | ICD-10-CM

## 2016-01-26 NOTE — Progress Notes (Signed)
GYN ENCOUNTER NOTE  Subjective:       Monica Terry is a 50 y.o. [phone] female is here for gynecologic evaluation of the following issues:  1. Menorrhagia: bleeding started again after stopping Provera during endometrial biopsy and has continued until today. She had restarted Provera a few days after endometrial biopsy but has not helped with bleeding.    Gynecologic History Patient's last menstrual period was 01/07/2016. Menarche-age 46. Intervals.-Monthly. Duration of flow-7 days; heavy clots, requiring ultra tampons and pads to control soiling. History of anemia due to her heavy periods. History of severe dysmenorrhea; 7-8 out of 10 in intensity; patient may use upwards of 16. Ibuprofen today for controlling cramping; patient has cramps that occur outside of her cycles as well. She does have low back pain without radiation. Long history of deep thrusting dyspareunia; she no longer has intercourse due to the pain.  Patient's last menstrual period was 01/07/2016. Contraception: Abstinence  Obstetric History OB History  Gravida Para Term Preterm AB SAB TAB Ectopic Multiple Living  4 3 3  1 1    3     # Outcome Date GA Lbr Len/2nd Weight Sex Delivery Anes PTL Lv  4 Term 1989   6 lb 1.6 oz (2.767 kg) F Vag-Spont   Y  3 Term 1988   6 lb 12.8 oz (3.084 kg) F Vag-Spont   Y  2 Term 1986   6 lb 9.6 oz (2.994 kg) F Vag-Spont   Y  1 SAB 1985              Past Medical History  Diagnosis Date  . Depression   . Bipolar 1 disorder ([place])   . COPD (chronic obstructive pulmonary disease) ([place])   . Sciatica   . Abnormal uterine bleeding   . Anxiety   . Hypercholesteremia   . Thyroid disease   . Brittle bones   . Neck pain     Past Surgical History  Procedure Laterality Date  . Neck surgery    . Spleenectomy    . Tubal ligation      Current Outpatient Prescriptions on File Prior to Visit  Medication Sig Dispense Refill  . albuterol (PROAIR HFA) 108 (90 BASE) MCG/ACT inhaler  Inhale into the lungs.    Marland Kitchen alendronate (FOSAMAX) 70 MG tablet Take 70 mg by mouth once a week. Take with a full glass of water on an empty stomach.    . cetirizine (ZYRTEC) 10 MG chewable tablet Chew 10 mg by mouth.    . diazepam (VALIUM) 5 MG tablet Take 1 tablet (5 mg total) by mouth every 8 (eight) hours as needed for anxiety. 20 tablet 0  . gabapentin (NEURONTIN) 100 MG capsule Take 100 mg by mouth 3 (three) times daily.      Marland Kitchen ibuprofen (ADVIL,MOTRIN) 200 MG tablet Take 800 mg by mouth every 6 (six) hours as needed. For pain     . levothyroxine (SYNTHROID, LEVOTHROID) 100 MCG tablet Take by mouth.    Marland Kitchen LORazepam (ATIVAN) 1 MG tablet Take 1 tablet (1 mg total) by mouth every 8 (eight) hours as needed for anxiety. 12 tablet 0  . lurasidone (LATUDA) 80 MG TABS tablet Take 80 mg by mouth.    . medroxyPROGESTERone (PROVERA) 10 MG tablet Take 1 tablet (10 mg total) by mouth daily. 30 tablet 1  . Multiple Vitamins-Minerals (MULTIVITAMINS THER. W/MINERALS) TABS Take 1 tablet by mouth daily.      Marland Kitchen oxyCODONE-acetaminophen (ROXICET) 5-325 MG per  tablet Take 1 tablet by mouth every 4 (four) hours as needed for severe pain. 12 tablet 0  . Ribavirin 200 & 400 MG MISC Take 600 mg by mouth 2 (two) times daily. 30 each 2  . simvastatin (ZOCOR) 20 MG tablet Take 20 mg by mouth.    . Sofosbuvir (SOVALDI) 400 MG TABS Take 400 mg by mouth daily. 30 tablet 2  . tiZANidine (ZANAFLEX) 4 MG tablet Take 4 mg by mouth.     No current facility-administered medications on file prior to visit.    No Known Allergies  Social History   Social History  . Marital Status: Single    Spouse Name: N/A  . Number of Children: N/A  . Years of Education: N/A   Occupational History  . Not on file.   Social History Main Topics  . Smoking status: Current Every Day Smoker -- 0.50 packs/day    Types: Cigarettes  . Smokeless tobacco: Not on file  . Alcohol Use: Yes     Comment: occas  . Drug Use: No  . Sexual  Activity: Not Currently    Birth Control/ Protection: Surgical   Other Topics Concern  . Not on file   Social History Narrative    Family History  Problem Relation Age of Onset  . Diabetes Mother   . Heart disease Mother   . Cervical cancer Sister   . Diabetes Maternal Grandmother   . Colon cancer Maternal Grandfather   . Breast cancer Neg Hx   . Ovarian cancer Neg Hx     The following portions of the patient's history were reviewed and updated as appropriate: allergies, current medications, past family history, past medical history, past social history, past surgical history and problem list.  Review of Systems See above  Objective:   BP 115/71 mmHg  Pulse 76  Ht 5\' 8"  (1.727 m)  Wt 203 lb (92.08 kg)  BMI 30.87 kg/m2  LMP 01/07/2016 CONSTITUTIONAL: Well-developed, well-nourished female in no acute distress.   Physical Exam deferred      Assessment:   1. Abnormal uterine bleeding  2. Endometrial polyp - s/p endometrial biopsy - Transvaginal ultrasound reveals slight increase in vascularization     Plan:   1. Patient interested in Hysteroscopy/D&C for endometrial polyp and continued menorrhagia. 2. Schedule for next available OR date and pre-op appointment. 3. Continue Provera 10mg  daily until surgery.  A total of 15 minutes were spent face-to-face with the patient during this encounter and over half of that time dealt with counseling and coordination of care.   Cathlean Sauer, PA-S Brayton Mars, MD   I have seen, interviewed, and examined the patient in conjunction with the [facility].A. student and affirm the diagnosis and management plan. Millissa Deese A. Jamarr Treinen, MD, FACOG   Note: This dictation was prepared with Dragon dictation along with smaller phrase technology. Any transcriptional errors that result from this process are unintentional.

## 2016-01-26 NOTE — Patient Instructions (Signed)
1.  Hysteroscopy/D&C is scheduled. 2.  Return in 2 weeks for preop appointment

## 2016-02-03 ENCOUNTER — Ambulatory Visit (INDEPENDENT_AMBULATORY_CARE_PROVIDER_SITE_OTHER): Payer: Medicaid Other | Admitting: Obstetrics and Gynecology

## 2016-02-03 ENCOUNTER — Encounter: Payer: Self-pay | Admitting: Obstetrics and Gynecology

## 2016-02-03 ENCOUNTER — Encounter: Payer: Self-pay | Admitting: *Deleted

## 2016-02-03 ENCOUNTER — Inpatient Hospital Stay: Admission: RE | Admit: 2016-02-03 | Payer: Self-pay | Source: Ambulatory Visit

## 2016-02-03 VITALS — BP 127/74 | HR 81 | Ht 68.0 in | Wt 204.8 lb

## 2016-02-03 DIAGNOSIS — R300 Dysuria: Secondary | ICD-10-CM

## 2016-02-03 DIAGNOSIS — N939 Abnormal uterine and vaginal bleeding, unspecified: Secondary | ICD-10-CM

## 2016-02-03 DIAGNOSIS — Z01818 Encounter for other preprocedural examination: Secondary | ICD-10-CM

## 2016-02-03 DIAGNOSIS — N840 Polyp of corpus uteri: Secondary | ICD-10-CM

## 2016-02-03 MED ORDER — SULFAMETHOXAZOLE-TRIMETHOPRIM 800-160 MG PO TABS: 1.0000 | ORAL_TABLET | Freq: Two times a day (BID) | ORAL | Status: DC

## 2016-02-03 NOTE — Patient Instructions (Signed)
  Your procedure is scheduled on: 02-08-16 [facility]) Report to [place] To find out your arrival time please call [phone] between 1PM - 3PM on 02-05-16 (FRIDAY)  Remember: Instructions that are not followed completely may result in serious medical risk, up to and including death, or upon the discretion of your surgeon and anesthesiologist your surgery may need to be rescheduled.    _X___ 1. Do not eat food or drink liquids after midnight. No gum chewing or hard candies.     _X___ 2. No Alcohol for 24 hours before or after surgery.   ____ 3. Bring all medications with you on the day of surgery if instructed.    _X___ 4. Notify your doctor if there is any change in your medical condition     (cold, fever, infections).     Do not wear jewelry, make-up, hairpins, clips or nail polish.  Do not wear lotions, powders, or perfumes. You may wear deodorant.  Do not shave 48 hours prior to surgery. Men may shave face and neck.  Do not bring valuables to the hospital.    [facility] is not responsible for any belongings or valuables.               Contacts, dentures or bridgework may not be worn into surgery.  Leave your suitcase in the car. After surgery it may be brought to your room.  For patients admitted to the hospital, discharge time is determined by your treatment team.   Patients discharged the day of surgery will not be allowed to drive home.   Please read over the following fact sheets that you were given:     _X___ Take these medicines the morning of surgery with A SIP OF WATER:    1. LEVOTHYROXINE  2. ZOCOR  3. ZANTAC  4. TAKE A ZANTAC Sunday NIGHT BEFORE BED  5.  6.  ____ Fleet Enema (as directed)   ____ Use CHG Soap as directed  _X___ Use inhalers on the day of surgery-USE ADVAIR, SPIRIVA AND ALBUTEROL INHALER AT [place] ALBUTEROL [place]  ____ Stop metformin 2 days prior to surgery    ____ Take 1/2 of usual  insulin dose the night before surgery and none on the morning of surgery.   ____ Stop Coumadin/Plavix/aspirin-N/A  ____ Stop Anti-inflammatories-NO NSAIDS OR ASPIRIN PRODUCTS-OXYCODONE OK TO CONTINUE   ____ Stop supplements until after surgery.    ____ Bring C-Pap to the hospital.

## 2016-02-03 NOTE — Patient Instructions (Addendum)
Obesity Obesity is defined as having too much total body fat and a body mass index (BMI) of 30 or more. BMI is an estimate of body fat and is calculated from your height and weight. BMI is typically calculated by your health care provider during regular wellness visits. Obesity happens when you consume more calories than you can burn by exercising or performing daily physical tasks. Prolonged obesity can cause major illnesses or emergencies, such as:  Stroke.  Heart disease.  Diabetes.  Cancer.  Arthritis.  High blood pressure (hypertension).  High cholesterol.  Sleep apnea.  Erectile dysfunction.  Infertility problems. CAUSES   Regularly eating unhealthy foods.  Physical inactivity.  Certain disorders, such as an underactive thyroid (hypothyroidism), Cushing's syndrome, and polycystic ovarian syndrome.  Certain medicines, such as steroids, some depression medicines, and antipsychotics.  Genetics.  Lack of sleep. DIAGNOSIS A health care provider can diagnose obesity after calculating your BMI. Obesity will be diagnosed if your BMI is 30 or higher. There are other methods of measuring obesity levels. Some other methods include measuring your skinfold thickness, your waist circumference, and comparing your hip circumference to your waist circumference. TREATMENT  A healthy treatment program includes some or all of the following:  Long-term dietary changes.  Exercise and physical activity.  Behavioral and lifestyle changes.  Medicine only under the supervision of your health care provider. Medicines may help, but only if they are used with diet and exercise programs. If your BMI is 40 or higher, your health care provider may recommend specialized surgery or programs to help with weight loss. An unhealthy treatment program includes:  Fasting.  Fad diets.  Supplements and drugs. These choices do not succeed in long-term weight control. HOME CARE  INSTRUCTIONS  Exercise and perform physical activity as directed by your health care provider. To increase physical activity, try the following:  Use stairs instead of elevators.  Park farther away from store entrances.  Garden, bike, or walk instead of watching television or using the computer.  Eat healthy, low-calorie foods and drinks on a regular basis. Eat more fruits and vegetables. Use low-calorie cookbooks or take healthy cooking classes.  Limit fast food, sweets, and processed snack foods.  Eat smaller portions.  Keep a daily journal of everything you eat. There are many free websites to help you with this. It may be helpful to measure your foods so you can determine if you are eating the correct portion sizes.  Avoid drinking alcohol. Drink more water and drinks without calories.  Take vitamins and supplements only as recommended by your health care provider.  Weight-loss support groups, Tax adviser, counselors, and stress reduction education can also be very helpful. SEEK IMMEDIATE MEDICAL CARE IF:  You have chest pain or tightness.  You have trouble breathing or feel short of breath.  You have weakness or leg numbness.  You feel confused or have trouble talking.  You have sudden changes in your vision.   This information is not intended to replace advice given to you by your health care provider. Make sure you discuss any questions you have with your health care provider.   Document Released: 01/19/2005 Document Revised: 01/02/2015 Document Reviewed: 01/18/2012 Elsevier Interactive Patient Education 2016 Elsevier Inc. BMI for Adults Body mass index (BMI) is a number that is calculated from a person's weight and height. In most adults, the number is used to find how much of an adult's weight is made up of fat. BMI is not as accurate as  a direct measure of body fat. HOW IS BMI CALCULATED? BMI is calculated by dividing weight in kilograms by height in  meters squared. It can also be calculated by dividing weight in pounds by height in inches squared, then multiplying the resulting number by 703. Charts are available to help you find your BMI quickly and easily without doing this calculation.  HOW IS BMI INTERPRETED? Health care professionals use BMI charts to identify whether an adult is underweight, at a normal weight, or overweight based on the following guidelines:  Underweight: BMI less than 18.5.  Normal weight: BMI between 18.5 and 24.9.  Overweight: BMI between 25 and 29.9.  Obese: BMI of 30 and above. BMI is usually interpreted the same for males and females. Weight includes both fat and muscle, so someone with a muscular build, such as an athlete, may have a BMI that is higher than 24.9. In cases like these, BMI may not accurately depict body fat. To determine if excess body fat is the cause of a BMI of 25 or higher, further assessments may need to be done by a health care provider. WHY IS BMI A USEFUL TOOL? BMI is used to identify a possible weight problem that may be related to a medical problem or may increase the risk for medical problems. BMI can also be used to promote changes to reach a healthy weight.   This information is not intended to replace advice given to you by your health care provider. Make sure you discuss any questions you have with your health care provider.   Document Released: 08/23/2004 Document Revised: 01/02/2015 Document Reviewed: 05/09/2014 Elsevier Interactive Patient Education [employer].

## 2016-02-03 NOTE — Progress Notes (Signed)
GYN ENCOUNTER NOTE  Subjective:  PREOPERATIVE HISTORY AND PHYSICAL       Monica Terry is a 50 y.o. [phone] female scheduled for Hysteroscopy/ D&C for endometrial polyp and menorrhagia.  Prior continued menorrhagia stopped on 01/29/2016. Continued taking Provera 10mg  daily PO.  Preoperative endometrial biopsy demonstrated endometrial polyp fragments. Preoperative ultrasound demonstrated vascular endometrium, uncertain etiology.  Patient with UTI symptoms including urinary frequency, dysuria starting yesterday. Took Flagyl TID yesterday and once this morning. Also took [place] yesterday and once this morning. States symptoms improved today. Denies fever, night sweats, chills. Patient with history of COPD, denies SOB that is outside of normal for her.  Urine culture is sent today; patient is treated prophylactically with Septra DS twice a day for 3 days prior to surgery.  Gynecologic History Patient's last menstrual period was 01/07/2016. Menarche-age 62.  Intervals.-Monthly.  Duration of flow-7 days; heavy clots, requiring ultra tampons and pads to control soiling.  History of anemia due to her heavy periods.  History of severe dysmenorrhea; 7-8 out of 10 in intensity; patient may use upwards of 16. Ibuprofen today for controlling cramping; patient has cramps that occur outside of her cycles as well. She does have low back pain without radiation.  Long history of deep thrusting dyspareunia; she no longer has intercourse due to the pain.   Contraception: Abstinence   Obstetric History OB History  Gravida Para Term Preterm AB SAB TAB Ectopic Multiple Living  4 3 3  1 1    3     # Outcome Date GA Lbr Len/2nd Weight Sex Delivery Anes PTL Lv  4 Term 1989   6 lb 1.6 oz (2.767 kg) F Vag-Spont   Y  3 Term 1988   6 lb 12.8 oz (3.084 kg) F Vag-Spont   Y  2 Term 1986   6 lb 9.6 oz (2.994 kg) F Vag-Spont   Y  1 SAB 1985              Past Medical History  Diagnosis Date  . Depression   .  Bipolar 1 disorder ([place])   . COPD (chronic obstructive pulmonary disease) ([place])   . Sciatica   . Abnormal uterine bleeding   . Anxiety   . Hypercholesteremia   . Thyroid disease   . Brittle bones   . Neck pain     Past Surgical History  Procedure Laterality Date  . Neck surgery    . Spleenectomy    . Tubal ligation      Current Outpatient Prescriptions on File Prior to Visit  Medication Sig Dispense Refill  . albuterol (PROAIR HFA) 108 (90 BASE) MCG/ACT inhaler Inhale into the lungs.    Marland Kitchen alendronate (FOSAMAX) 70 MG tablet Take 70 mg by mouth once a week. Take with a full glass of water on an empty stomach.    . cetirizine (ZYRTEC) 10 MG chewable tablet Chew 10 mg by mouth.    . diazepam (VALIUM) 5 MG tablet Take 1 tablet (5 mg total) by mouth every 8 (eight) hours as needed for anxiety. 20 tablet 0  . gabapentin (NEURONTIN) 100 MG capsule Take 100 mg by mouth 3 (three) times daily.      Marland Kitchen ibuprofen (ADVIL,MOTRIN) 200 MG tablet Take 800 mg by mouth every 6 (six) hours as needed. For pain     . levothyroxine (SYNTHROID, LEVOTHROID) 100 MCG tablet Take by mouth.    Marland Kitchen LORazepam (ATIVAN) 1 MG tablet Take 1 tablet (1 mg total)  by mouth every 8 (eight) hours as needed for anxiety. 12 tablet 0  . lurasidone (LATUDA) 80 MG TABS tablet Take 80 mg by mouth.    . medroxyPROGESTERone (PROVERA) 10 MG tablet Take 1 tablet (10 mg total) by mouth daily. 30 tablet 1  . Multiple Vitamins-Minerals (MULTIVITAMINS THER. W/MINERALS) TABS Take 1 tablet by mouth daily.      Marland Kitchen oxyCODONE-acetaminophen (ROXICET) 5-325 MG per tablet Take 1 tablet by mouth every 4 (four) hours as needed for severe pain. 12 tablet 0  . pregabalin (LYRICA) 100 MG capsule Take 100 mg by mouth 2 (two) times daily.    . Ribavirin 200 & 400 MG MISC Take 600 mg by mouth 2 (two) times daily. 30 each 2  . tiZANidine (ZANAFLEX) 4 MG tablet Take 4 mg by mouth.     No current facility-administered medications on file prior to visit.     No Known Allergies  Social History   Social History  . Marital Status: Single    Spouse Name: N/A  . Number of Children: N/A  . Years of Education: N/A   Occupational History  . Not on file.   Social History Main Topics  . Smoking status: Current Every Day Smoker -- 0.50 packs/day    Types: Cigarettes  . Smokeless tobacco: Not on file  . Alcohol Use: Yes     Comment: occas  . Drug Use: No  . Sexual Activity: Not Currently    Birth Control/ Protection: Surgical   Other Topics Concern  . Not on file   Social History Narrative    Family History  Problem Relation Age of Onset  . Diabetes Mother   . Heart disease Mother   . Cervical cancer Sister   . Diabetes Maternal Grandmother   . Colon cancer Maternal Grandfather   . Breast cancer Neg Hx   . Ovarian cancer Neg Hx     The following portions of the patient's history were reviewed and updated as appropriate: allergies, current medications, past family history, past medical history, past social history, past surgical history and problem list.  Review of Systems See above HPI  Objective:   BP 127/74 mmHg  Pulse 81  Ht 5\' 8"  (1.727 m)  Wt 204 lb 12.8 oz (92.897 kg)  BMI 31.15 kg/m2  LMP 01/07/2016 CONSTITUTIONAL: Well-developed, well-nourished female in no acute distress.  HENT:  Normocephalic, atraumatic.  NECK: Normal range of motion, supple, no masses.  Normal thyroid.  SKIN: Skin is warm and dry. No rash noted. Not diaphoretic. No erythema. No pallor. [place]: Alert and oriented to person, place, and time.  PSYCHIATRIC: Normal mood and affect. Normal behavior. Normal judgment and thought content. CARDIOVASCULAR: Normal S1, S2. No m/r/g RESPIRATORY: Clear to auscultation bilaterally. No wheezing noted.  BREASTS: Not Examined ABDOMEN: Defer till OR PELVIC: Not Examined     Assessment:  1.  Abnormal uterine bleeding; endometrial polyp fragment on biopsy  2. Dysuria - Urine culture   2.  Endometrial polyp - s/p endometrial biopsy - Transvaginal ultrasound reveals slight increase in vascularization   Plan:  1.  Hysteroscopy/D&C.  Date of surgery 02/08/2016  Preoperative consent: Patient is to undergo hysteroscopy/D&C for abnormal uterine bleeding.  She is understanding of the planned procedure and is aware of and is accepting of all surgical risks which include but are not limited to bleeding, infection, pelvic organ injury with need for repair, uterine perforation, anesthesia risks, blood clots disorders, etc.  All questions are answered.  Informed consent is given.  She does ready and willing to proceed with surgery as scheduled.  2. UA and culture obtained today for UTI symptoms.  3. Start sulfamethoxazole-trimethoprim 800-160 MG BID PO x 3 days.    Cathlean Sauer, PA-S Brayton Mars, MD   I have seen, interviewed, and examined the patient in conjunction with the [facility].A. student and affirm the diagnosis and management plan. Raileigh Sabater A. Lorenzo Pereyra, MD, FACOG   Note: This dictation was prepared with Dragon dictation along with smaller phrase technology. Any transcriptional errors that result from this process are unintentional.

## 2016-02-03 NOTE — H&P (Signed)
GYN ENCOUNTER NOTE  Subjective:  PREOPERATIVE HISTORY AND PHYSICAL       Monica Terry is a 50 y.o. [phone] female scheduled for Hysteroscopy/ D&C for endometrial polyp and menorrhagia.  Prior continued menorrhagia stopped on 01/29/2016. Continued taking Provera 10mg  daily PO.  Preoperative endometrial biopsy demonstrated endometrial polyp fragments. Preoperative ultrasound demonstrated vascular endometrium, uncertain etiology.  Patient with UTI symptoms including urinary frequency, dysuria starting yesterday. Took Flagyl TID yesterday and once this morning. Also took [place] yesterday and once this morning. States symptoms improved today. Denies fever, night sweats, chills. Patient with history of COPD, denies SOB that is outside of normal for her.  Urine culture is sent today; patient is treated prophylactically with Septra DS twice a day for 3 days prior to surgery.  Gynecologic History Patient's last menstrual period was 01/07/2016. Menarche-age 22.  Intervals.-Monthly.  Duration of flow-7 days; heavy clots, requiring ultra tampons and pads to control soiling.  History of anemia due to her heavy periods.  History of severe dysmenorrhea; 7-8 out of 10 in intensity; patient may use upwards of 16. Ibuprofen today for controlling cramping; patient has cramps that occur outside of her cycles as well. She does have low back pain without radiation.  Long history of deep thrusting dyspareunia; she no longer has intercourse due to the pain.   Contraception: Abstinence   Obstetric History OB History  Gravida Para Term Preterm AB SAB TAB Ectopic Multiple Living  4 3 3  1 1    3     # Outcome Date GA Lbr Len/2nd Weight Sex Delivery Anes PTL Lv  4 Term 1989   6 lb 1.6 oz (2.767 kg) F Vag-Spont   Y  3 Term 1988   6 lb 12.8 oz (3.084 kg) F Vag-Spont   Y  2 Term 1986   6 lb 9.6 oz (2.994 kg) F Vag-Spont   Y  1 SAB 1985              Past Medical History  Diagnosis Date  . Depression   .  Bipolar 1 disorder ([place])   . COPD (chronic obstructive pulmonary disease) ([place])   . Sciatica   . Abnormal uterine bleeding   . Anxiety   . Hypercholesteremia   . Thyroid disease   . Brittle bones   . Neck pain     Past Surgical History  Procedure Laterality Date  . Neck surgery    . Spleenectomy    . Tubal ligation      Current Outpatient Prescriptions on File Prior to Visit  Medication Sig Dispense Refill  . albuterol (PROAIR HFA) 108 (90 BASE) MCG/ACT inhaler Inhale into the lungs.    Marland Kitchen alendronate (FOSAMAX) 70 MG tablet Take 70 mg by mouth once a week. Take with a full glass of water on an empty stomach.    . cetirizine (ZYRTEC) 10 MG chewable tablet Chew 10 mg by mouth.    . diazepam (VALIUM) 5 MG tablet Take 1 tablet (5 mg total) by mouth every 8 (eight) hours as needed for anxiety. 20 tablet 0  . gabapentin (NEURONTIN) 100 MG capsule Take 100 mg by mouth 3 (three) times daily.      Marland Kitchen ibuprofen (ADVIL,MOTRIN) 200 MG tablet Take 800 mg by mouth every 6 (six) hours as needed. For pain     . levothyroxine (SYNTHROID, LEVOTHROID) 100 MCG tablet Take by mouth.    Marland Kitchen LORazepam (ATIVAN) 1 MG tablet Take 1 tablet (1 mg total)  by mouth every 8 (eight) hours as needed for anxiety. 12 tablet 0  . lurasidone (LATUDA) 80 MG TABS tablet Take 80 mg by mouth.    . medroxyPROGESTERone (PROVERA) 10 MG tablet Take 1 tablet (10 mg total) by mouth daily. 30 tablet 1  . Multiple Vitamins-Minerals (MULTIVITAMINS THER. W/MINERALS) TABS Take 1 tablet by mouth daily.      Marland Kitchen oxyCODONE-acetaminophen (ROXICET) 5-325 MG per tablet Take 1 tablet by mouth every 4 (four) hours as needed for severe pain. 12 tablet 0  . pregabalin (LYRICA) 100 MG capsule Take 100 mg by mouth 2 (two) times daily.    . Ribavirin 200 & 400 MG MISC Take 600 mg by mouth 2 (two) times daily. 30 each 2  . tiZANidine (ZANAFLEX) 4 MG tablet Take 4 mg by mouth.     No current facility-administered medications on file prior to visit.     No Known Allergies  Social History   Social History  . Marital Status: Single    Spouse Name: N/A  . Number of Children: N/A  . Years of Education: N/A   Occupational History  . Not on file.   Social History Main Topics  . Smoking status: Current Every Day Smoker -- 0.50 packs/day    Types: Cigarettes  . Smokeless tobacco: Not on file  . Alcohol Use: Yes     Comment: occas  . Drug Use: No  . Sexual Activity: Not Currently    Birth Control/ Protection: Surgical   Other Topics Concern  . Not on file   Social History Narrative    Family History  Problem Relation Age of Onset  . Diabetes Mother   . Heart disease Mother   . Cervical cancer Sister   . Diabetes Maternal Grandmother   . Colon cancer Maternal Grandfather   . Breast cancer Neg Hx   . Ovarian cancer Neg Hx     The following portions of the patient's history were reviewed and updated as appropriate: allergies, current medications, past family history, past medical history, past social history, past surgical history and problem list.  Review of Systems See above HPI  Objective:   BP 127/74 mmHg  Pulse 81  Ht 5\' 8"  (1.727 m)  Wt 204 lb 12.8 oz (92.897 kg)  BMI 31.15 kg/m2  LMP 01/07/2016 CONSTITUTIONAL: Well-developed, well-nourished female in no acute distress.  HENT:  Normocephalic, atraumatic.  NECK: Normal range of motion, supple, no masses.  Normal thyroid.  SKIN: Skin is warm and dry. No rash noted. Not diaphoretic. No erythema. No pallor. [place]: Alert and oriented to person, place, and time.  PSYCHIATRIC: Normal mood and affect. Normal behavior. Normal judgment and thought content. CARDIOVASCULAR: Normal S1, S2. No m/r/g RESPIRATORY: Clear to auscultation bilaterally. No wheezing noted.  BREASTS: Not Examined ABDOMEN: Defer till OR PELVIC: Not Examined     Assessment:  1.  Abnormal uterine bleeding; endometrial polyp fragment on biopsy  2. Dysuria - Urine culture   2.  Endometrial polyp - s/p endometrial biopsy - Transvaginal ultrasound reveals slight increase in vascularization   Plan:  1.  Hysteroscopy/D&C.  Date of surgery 02/08/2016  Preoperative consent: Patient is to undergo hysteroscopy/D&C for abnormal uterine bleeding.  She is understanding of the planned procedure and is aware of and is accepting of all surgical risks which include but are not limited to bleeding, infection, pelvic organ injury with need for repair, uterine perforation, anesthesia risks, blood clots disorders, etc.  All questions are answered.  Informed consent is given.  She does ready and willing to proceed with surgery as scheduled.  2. UA and culture obtained today for UTI symptoms.  3. Start sulfamethoxazole-trimethoprim 800-160 MG BID PO x 3 days.    Cathlean Sauer, PA-S Brayton Mars, MD   I have seen, interviewed, and examined the patient in conjunction with the [facility].A. student and affirm the diagnosis and management plan. Reneisha Stilley A. Westin Knotts, MD, FACOG   Note: This dictation was prepared with Dragon dictation along with smaller phrase technology. Any transcriptional errors that result from this process are unintentional.

## 2016-02-04 ENCOUNTER — Encounter
Admission: RE | Admit: 2016-02-04 | Discharge: 2016-02-04 | Disposition: A | Discharge status: Home / Self Care | Payer: Medicaid Other | Source: Ambulatory Visit | Attending: Obstetrics and Gynecology | Admitting: Obstetrics and Gynecology

## 2016-02-04 DIAGNOSIS — Z01810 Encounter for preprocedural cardiovascular examination: Secondary | ICD-10-CM | POA: Insufficient documentation

## 2016-02-04 LAB — CBC WITH DIFFERENTIAL/PLATELET
BAND NEUTROPHILS: 0 %
BASOS ABS: 0.1 10*3/uL (ref 0.0–0.1)
BASOS PCT: 1 %
BLASTS: 0 %
EOS ABS: 0.6 10*3/uL (ref 0.0–0.7)
EOS PCT: 4 %
HCT: 36.7 % (ref 35.0–47.0)
Hemoglobin: 11.7 g/dL — ABNORMAL LOW (ref 12.0–16.0)
LYMPHS ABS: 4.3 10*3/uL — AB (ref 0.7–4.0)
LYMPHS PCT: 31 %
MCH: 29.9 pg (ref 26.0–34.0)
MCHC: 32 g/dL (ref 32.0–36.0)
MCV: 93.5 fL (ref 80.0–100.0)
METAMYELOCYTES PCT: 0 %
MONOS PCT: 4 %
Monocytes Absolute: 0.6 10*3/uL (ref 0.1–1.0)
Myelocytes: 0 %
NEUTROS ABS: 8.2 10*3/uL — AB (ref 1.7–7.7)
Neutrophils Relative %: 60 %
OTHER: 0 %
PLATELETS: 452 10*3/uL — AB (ref 150–440)
Promyelocytes Absolute: 0 %
RBC: 3.93 MIL/uL (ref 3.80–5.20)
RDW: 14.7 % — AB (ref 11.5–14.5)
WBC: 13.8 10*3/uL — ABNORMAL HIGH (ref 3.6–11.0)
nRBC: 0 /100 WBC

## 2016-02-04 LAB — RAPID HIV SCREEN (HIV 1/2 AB+AG)
HIV 1/2 Antibodies: NONREACTIVE
HIV-1 P24 ANTIGEN - HIV24: NONREACTIVE

## 2016-02-05 LAB — RPR: RPR: NONREACTIVE

## 2016-02-05 NOTE — Pre-Procedure Instructions (Signed)
Faxed pre-op labs with elevated wbc result to dr defrancescos office-fax confirmation received

## 2016-02-06 LAB — URINE CULTURE

## 2016-02-08 ENCOUNTER — Ambulatory Visit
Admission: RE | Admit: 2016-02-08 | Discharge: 2016-02-08 | Disposition: A | Discharge status: Home / Self Care | Payer: Medicaid Other | Source: Ambulatory Visit | Attending: Obstetrics and Gynecology | Admitting: Obstetrics and Gynecology

## 2016-02-08 ENCOUNTER — Ambulatory Visit: Payer: Medicaid Other | Admitting: Anesthesiology

## 2016-02-08 ENCOUNTER — Encounter: Payer: Self-pay | Admitting: *Deleted

## 2016-02-08 ENCOUNTER — Encounter
Admission: RE | Disposition: A | Discharge status: Home / Self Care | Payer: Self-pay | Source: Ambulatory Visit | Attending: Obstetrics and Gynecology

## 2016-02-08 DIAGNOSIS — N939 Abnormal uterine and vaginal bleeding, unspecified: Secondary | ICD-10-CM | POA: Diagnosis not present

## 2016-02-08 DIAGNOSIS — E079 Disorder of thyroid, unspecified: Secondary | ICD-10-CM | POA: Insufficient documentation

## 2016-02-08 DIAGNOSIS — Z9081 Acquired absence of spleen: Secondary | ICD-10-CM | POA: Insufficient documentation

## 2016-02-08 DIAGNOSIS — Z9889 Other specified postprocedural states: Secondary | ICD-10-CM | POA: Diagnosis not present

## 2016-02-08 DIAGNOSIS — Z79890 Hormone replacement therapy (postmenopausal): Secondary | ICD-10-CM | POA: Diagnosis not present

## 2016-02-08 DIAGNOSIS — F419 Anxiety disorder, unspecified: Secondary | ICD-10-CM | POA: Insufficient documentation

## 2016-02-08 DIAGNOSIS — Z8049 Family history of malignant neoplasm of other genital organs: Secondary | ICD-10-CM | POA: Diagnosis not present

## 2016-02-08 DIAGNOSIS — E7800 Pure hypercholesterolemia, unspecified: Secondary | ICD-10-CM | POA: Diagnosis not present

## 2016-02-08 DIAGNOSIS — Q780 Osteogenesis imperfecta: Secondary | ICD-10-CM | POA: Diagnosis not present

## 2016-02-08 DIAGNOSIS — J449 Chronic obstructive pulmonary disease, unspecified: Secondary | ICD-10-CM | POA: Insufficient documentation

## 2016-02-08 DIAGNOSIS — Z79899 Other long term (current) drug therapy: Secondary | ICD-10-CM | POA: Diagnosis not present

## 2016-02-08 DIAGNOSIS — F17210 Nicotine dependence, cigarettes, uncomplicated: Secondary | ICD-10-CM | POA: Insufficient documentation

## 2016-02-08 DIAGNOSIS — N839 Noninflammatory disorder of ovary, fallopian tube and broad ligament, unspecified: Secondary | ICD-10-CM | POA: Diagnosis not present

## 2016-02-08 DIAGNOSIS — Z791 Long term (current) use of non-steroidal anti-inflammatories (NSAID): Secondary | ICD-10-CM | POA: Insufficient documentation

## 2016-02-08 DIAGNOSIS — N840 Polyp of corpus uteri: Secondary | ICD-10-CM | POA: Insufficient documentation

## 2016-02-08 DIAGNOSIS — F319 Bipolar disorder, unspecified: Secondary | ICD-10-CM | POA: Diagnosis not present

## 2016-02-08 DIAGNOSIS — Z8249 Family history of ischemic heart disease and other diseases of the circulatory system: Secondary | ICD-10-CM | POA: Diagnosis not present

## 2016-02-08 DIAGNOSIS — Z9851 Tubal ligation status: Secondary | ICD-10-CM | POA: Insufficient documentation

## 2016-02-08 DIAGNOSIS — Z833 Family history of diabetes mellitus: Secondary | ICD-10-CM | POA: Insufficient documentation

## 2016-02-08 HISTORY — DX: Gastro-esophageal reflux disease without esophagitis: K21.9

## 2016-02-08 HISTORY — DX: Inflammatory liver disease, unspecified: K75.9

## 2016-02-08 HISTORY — PX: HYSTEROSCOPY WITH D & C: SHX1775

## 2016-02-08 HISTORY — DX: Unspecified osteoarthritis, unspecified site: M19.90

## 2016-02-08 HISTORY — DX: Hypothyroidism, unspecified: E03.9

## 2016-02-08 HISTORY — DX: Cardiac murmur, unspecified: R01.1

## 2016-02-08 HISTORY — DX: Anemia, unspecified: D64.9

## 2016-02-08 LAB — POCT PREGNANCY, URINE: Preg Test, Ur: NEGATIVE

## 2016-02-08 SURGERY — DILATATION AND CURETTAGE /HYSTEROSCOPY
Anesthesia: General

## 2016-02-08 MED ORDER — PHENYLEPHRINE HCL 10 MG/ML IJ SOLN
INTRAMUSCULAR | Status: DC | PRN
  Administered 2016-02-08: 50 ug via INTRAVENOUS

## 2016-02-08 MED ORDER — FENTANYL CITRATE (PF) 100 MCG/2ML IJ SOLN
INTRAMUSCULAR | Status: DC | PRN
  Administered 2016-02-08 (×4): 50 ug via INTRAVENOUS

## 2016-02-08 MED ORDER — FENTANYL CITRATE (PF) 100 MCG/2ML IJ SOLN
INTRAMUSCULAR | Status: AC
  Administered 2016-02-08: 25 ug via INTRAVENOUS
  Filled 2016-02-08: qty 2

## 2016-02-08 MED ORDER — ONDANSETRON HCL 4 MG/2ML IJ SOLN: 4.0000 mg | Freq: Once | INTRAMUSCULAR | Status: DC | PRN

## 2016-02-08 MED ORDER — KETOROLAC TROMETHAMINE 30 MG/ML IJ SOLN
INTRAMUSCULAR | Status: DC | PRN
  Administered 2016-02-08: 30 mg via INTRAVENOUS

## 2016-02-08 MED ORDER — FAMOTIDINE 20 MG PO TABS
ORAL_TABLET | ORAL | Status: DC
Start: 2016-02-08 — End: 2016-02-08
  Filled 2016-02-08: qty 1

## 2016-02-08 MED ORDER — LACTATED RINGERS IV SOLN: INTRAVENOUS | Status: DC

## 2016-02-08 MED ORDER — OXYCODONE-ACETAMINOPHEN 5-325 MG PO TABS: 1.0000 | ORAL_TABLET | ORAL | Status: DC | PRN

## 2016-02-08 MED ORDER — LIDOCAINE HCL (CARDIAC) 20 MG/ML IV SOLN
INTRAVENOUS | Status: DC | PRN
  Administered 2016-02-08: 60 mg via INTRAVENOUS

## 2016-02-08 MED ORDER — FENTANYL CITRATE (PF) 100 MCG/2ML IJ SOLN
25.0000 ug | INTRAMUSCULAR | Status: AC | PRN
  Administered 2016-02-08 (×6): 25 ug via INTRAVENOUS

## 2016-02-08 MED ORDER — IBUPROFEN 800 MG PO TABS: 800.0000 mg | ORAL_TABLET | Freq: Three times a day (TID) | ORAL | Status: DC

## 2016-02-08 MED ORDER — PROPOFOL 10 MG/ML IV BOLUS
INTRAVENOUS | Status: DC | PRN
  Administered 2016-02-08: 100 mg via INTRAVENOUS

## 2016-02-08 MED ORDER — LACTATED RINGERS IV SOLN
INTRAVENOUS | Status: DC
  Administered 2016-02-08 (×2): via INTRAVENOUS

## 2016-02-08 MED ORDER — ONDANSETRON HCL 4 MG/2ML IJ SOLN
INTRAMUSCULAR | Status: DC | PRN
  Administered 2016-02-08: 4 mg via INTRAVENOUS

## 2016-02-08 SURGICAL SUPPLY — 11 items
CATH ROBINSON RED A/P 16FR (CATHETERS) ×3 IMPLANT
GLOVE BIO SURGEON STRL SZ8 (GLOVE) ×3 IMPLANT
GOWN STRL REUS W/ TWL LRG LVL3 (GOWN DISPOSABLE) ×1 IMPLANT
GOWN STRL REUS W/ TWL XL LVL3 (GOWN DISPOSABLE) ×1 IMPLANT
GOWN STRL REUS W/TWL LRG LVL3 (GOWN DISPOSABLE) ×2
GOWN STRL REUS W/TWL XL LVL3 (GOWN DISPOSABLE) ×2
IV LACTATED RINGERS 1000ML (IV SOLUTION) ×3 IMPLANT
KIT RM TURNOVER CYSTO AR (KITS) ×3 IMPLANT
PACK DNC HYST (MISCELLANEOUS) ×3 IMPLANT
PAD OB MATERNITY 4.3X12.25 (PERSONAL CARE ITEMS) ×3 IMPLANT
PAD PREP 24X41 OB/GYN DISP (PERSONAL CARE ITEMS) ×3 IMPLANT

## 2016-02-08 NOTE — Interval H&P Note (Signed)
History and Physical Interval Note:  02/08/2016 10:32 AM  Monica Terry  has presented today for surgery, with the diagnosis of ABNORMAL UTERINE BLEEDING, ENDOMETRIAL POLYP  The various methods of treatment have been discussed with the patient and family. After consideration of risks, benefits and other options for treatment, the patient has consented to  Procedure(s): DILATATION AND CURETTAGE /HYSTEROSCOPY (N/A) as a surgical intervention .  The patient's history has been reviewed, patient examined, no change in status, stable for surgery.  I have reviewed the patient's chart and labs.  Questions were answered to the patient's satisfaction.     Hassell Done A Rheanne Cortopassi

## 2016-02-08 NOTE — Discharge Instructions (Signed)

## 2016-02-08 NOTE — Op Note (Signed)
OPERATIVE NOTE  Date of surgery: 02/08/2016  Preoperative diagnosis: AUB; Endometrial Polyps Postoperative diagnosis: Same Operative procedure: 1. Hysteroscopy 2. Dilation and curettage of endometrium  Surgeon: Dr. Zipporah Plants Assistant: None  Anesthesia: General LMA   Findings: Pelvis: Gynecoid; good uterine descensus with tenaculum traction; good TVH candidate Uterus: sounded to 9.5 cm Normal appearing endometrial cavity with several small polyps  Description of procedure Patient was brought to the operating room where she was placed in supine position. General anesthesia was induced without difficulty using the LMA technique. The patient was placed in dorsal lithotomy position using candy cane stirrups. A betadine perineal intravaginal prep and drape was performed in standard fashion. Weighted speculum was placed in the vagina. Single-tooth tenaculum was placed on the anterior cervix. Uterus was sounded to 9.5 cm. Hanks dilators were used up to a  #20 [place] caliber to dilate the endocervical canal. The [facility] hysteroscope using lactated Ringer's as irrigant was used for the hysteroscopy. The above-noted findings were photo documented. The hysteroscope removed. The smooth and serrated curettes were then used to curettage the endometrial cavity with production of average tissue. Stone polyp forceps were used to grasp residual tissue left behind. Repeat hysteroscopy was performed and demonstrated excellent sampling. Procedure was then terminated with all instrumentation being removed from the vagina. The patient was then awakened mobilized and taken to the recovery room in satisfactory condition.  IV fluids: Per anesthesia record . Urine output: 30 mL. EBL: 15 mL.  Instruments, needles, and sponge counts were verified as correct.  Brayton Mars, MD   ADDENDUM: Patient is an excellent TVH candidate if hysterectomy is indicated 02/08/2016

## 2016-02-08 NOTE — Transfer of Care (Signed)
Immediate Anesthesia Transfer of Care Note  Patient: Monica Terry  Procedure(s) Performed: Procedure(s): DILATATION AND CURETTAGE /HYSTEROSCOPY (N/A)  Patient Location: PACU  Anesthesia Type:General  Level of Consciousness: awake, alert  and oriented  Airway & Oxygen Therapy: Patient Spontanous Breathing and Patient connected to nasal cannula oxygen  Post-op Assessment: Report given to RN and Post -op Vital signs reviewed and stable  Post vital signs: Reviewed and stable  Last Vitals:  Filed Vitals:   02/08/16 1140  BP: 103/62  Pulse: 88  Temp: 37.1 C  Resp: 16    Complications: No apparent anesthesia complications

## 2016-02-08 NOTE — Anesthesia Preprocedure Evaluation (Signed)
Anesthesia Evaluation  Patient identified by MRN, date of birth, ID band Patient awake    Reviewed: Allergy & Precautions, H&P , NPO status , Patient's Chart, lab work & pertinent test results, reviewed documented beta blocker date and time   Airway Mallampati: III  TM Distance: >3 FB Neck ROM: full    Dental  (+) Teeth Intact, Upper Dentures   Pulmonary neg pulmonary ROS, COPD, Current Smoker,    Pulmonary exam normal        Cardiovascular Exercise Tolerance: Good negative cardio ROS Normal cardiovascular exam+ Valvular Problems/Murmurs  Rhythm:regular Rate:Normal - Systolic murmurs and - Diastolic murmurs    Neuro/Psych PSYCHIATRIC DISORDERS  Neuromuscular disease negative neurological ROS  negative psych ROS   GI/Hepatic negative GI ROS, Neg liver ROS, GERD  ,(+) Hepatitis -  Endo/Other  negative endocrine ROSHypothyroidism   Renal/GU negative Renal ROS  negative genitourinary   Musculoskeletal   Abdominal   Peds  Hematology negative hematology ROS (+) anemia ,   Anesthesia Other Findings   Reproductive/Obstetrics negative OB ROS                             Anesthesia Physical Anesthesia Plan  ASA: III  Anesthesia Plan: General LMA   Post-op Pain Management:    Induction:   Airway Management Planned:   Additional Equipment:   Intra-op Plan:   Post-operative Plan:   Informed Consent: I have reviewed the patients History and Physical, chart, labs and discussed the procedure including the risks, benefits and alternatives for the proposed anesthesia with the patient or authorized representative who has indicated his/her understanding and acceptance.     Plan Discussed with: CRNA  Anesthesia Plan Comments:         Anesthesia Quick Evaluation

## 2016-02-08 NOTE — H&P (View-Only) (Signed)
GYN ENCOUNTER NOTE  Subjective:  PREOPERATIVE HISTORY AND PHYSICAL       Monica Terry is a 50 y.o. [phone] female scheduled for Hysteroscopy/ D&C for endometrial polyp and menorrhagia.  Prior continued menorrhagia stopped on 01/29/2016. Continued taking Provera 10mg  daily PO.  Preoperative endometrial biopsy demonstrated endometrial polyp fragments. Preoperative ultrasound demonstrated vascular endometrium, uncertain etiology.  Patient with UTI symptoms including urinary frequency, dysuria starting yesterday. Took Flagyl TID yesterday and once this morning. Also took [place] yesterday and once this morning. States symptoms improved today. Denies fever, night sweats, chills. Patient with history of COPD, denies SOB that is outside of normal for her.  Urine culture is sent today; patient is treated prophylactically with Septra DS twice a day for 3 days prior to surgery.  Gynecologic History Patient's last menstrual period was 01/07/2016. Menarche-age 89.  Intervals.-Monthly.  Duration of flow-7 days; heavy clots, requiring ultra tampons and pads to control soiling.  History of anemia due to her heavy periods.  History of severe dysmenorrhea; 7-8 out of 10 in intensity; patient may use upwards of 16. Ibuprofen today for controlling cramping; patient has cramps that occur outside of her cycles as well. She does have low back pain without radiation.  Long history of deep thrusting dyspareunia; she no longer has intercourse due to the pain.   Contraception: Abstinence   Obstetric History OB History  Gravida Para Term Preterm AB SAB TAB Ectopic Multiple Living  4 3 3  1 1    3     # Outcome Date GA Lbr Len/2nd Weight Sex Delivery Anes PTL Lv  4 Term 1989   6 lb 1.6 oz (2.767 kg) F Vag-Spont   Y  3 Term 1988   6 lb 12.8 oz (3.084 kg) F Vag-Spont   Y  2 Term 1986   6 lb 9.6 oz (2.994 kg) F Vag-Spont   Y  1 SAB 1985              Past Medical History  Diagnosis Date  . Depression   .  Bipolar 1 disorder ([place])   . COPD (chronic obstructive pulmonary disease) ([place])   . Sciatica   . Abnormal uterine bleeding   . Anxiety   . Hypercholesteremia   . Thyroid disease   . Brittle bones   . Neck pain     Past Surgical History  Procedure Laterality Date  . Neck surgery    . Spleenectomy    . Tubal ligation      Current Outpatient Prescriptions on File Prior to Visit  Medication Sig Dispense Refill  . albuterol (PROAIR HFA) 108 (90 BASE) MCG/ACT inhaler Inhale into the lungs.    Marland Kitchen alendronate (FOSAMAX) 70 MG tablet Take 70 mg by mouth once a week. Take with a full glass of water on an empty stomach.    . cetirizine (ZYRTEC) 10 MG chewable tablet Chew 10 mg by mouth.    . diazepam (VALIUM) 5 MG tablet Take 1 tablet (5 mg total) by mouth every 8 (eight) hours as needed for anxiety. 20 tablet 0  . gabapentin (NEURONTIN) 100 MG capsule Take 100 mg by mouth 3 (three) times daily.      Marland Kitchen ibuprofen (ADVIL,MOTRIN) 200 MG tablet Take 800 mg by mouth every 6 (six) hours as needed. For pain     . levothyroxine (SYNTHROID, LEVOTHROID) 100 MCG tablet Take by mouth.    Marland Kitchen LORazepam (ATIVAN) 1 MG tablet Take 1 tablet (1 mg total)  by mouth every 8 (eight) hours as needed for anxiety. 12 tablet 0  . lurasidone (LATUDA) 80 MG TABS tablet Take 80 mg by mouth.    . medroxyPROGESTERone (PROVERA) 10 MG tablet Take 1 tablet (10 mg total) by mouth daily. 30 tablet 1  . Multiple Vitamins-Minerals (MULTIVITAMINS THER. W/MINERALS) TABS Take 1 tablet by mouth daily.      Marland Kitchen oxyCODONE-acetaminophen (ROXICET) 5-325 MG per tablet Take 1 tablet by mouth every 4 (four) hours as needed for severe pain. 12 tablet 0  . pregabalin (LYRICA) 100 MG capsule Take 100 mg by mouth 2 (two) times daily.    . Ribavirin 200 & 400 MG MISC Take 600 mg by mouth 2 (two) times daily. 30 each 2  . tiZANidine (ZANAFLEX) 4 MG tablet Take 4 mg by mouth.     No current facility-administered medications on file prior to visit.     No Known Allergies  Social History   Social History  . Marital Status: Single    Spouse Name: N/A  . Number of Children: N/A  . Years of Education: N/A   Occupational History  . Not on file.   Social History Main Topics  . Smoking status: Current Every Day Smoker -- 0.50 packs/day    Types: Cigarettes  . Smokeless tobacco: Not on file  . Alcohol Use: Yes     Comment: occas  . Drug Use: No  . Sexual Activity: Not Currently    Birth Control/ Protection: Surgical   Other Topics Concern  . Not on file   Social History Narrative    Family History  Problem Relation Age of Onset  . Diabetes Mother   . Heart disease Mother   . Cervical cancer Sister   . Diabetes Maternal Grandmother   . Colon cancer Maternal Grandfather   . Breast cancer Neg Hx   . Ovarian cancer Neg Hx     The following portions of the patient's history were reviewed and updated as appropriate: allergies, current medications, past family history, past medical history, past social history, past surgical history and problem list.  Review of Systems See above HPI  Objective:   BP 127/74 mmHg  Pulse 81  Ht 5\' 8"  (1.727 m)  Wt 204 lb 12.8 oz (92.897 kg)  BMI 31.15 kg/m2  LMP 01/07/2016 CONSTITUTIONAL: Well-developed, well-nourished female in no acute distress.  HENT:  Normocephalic, atraumatic.  NECK: Normal range of motion, supple, no masses.  Normal thyroid.  SKIN: Skin is warm and dry. No rash noted. Not diaphoretic. No erythema. No pallor. [place]: Alert and oriented to person, place, and time.  PSYCHIATRIC: Normal mood and affect. Normal behavior. Normal judgment and thought content. CARDIOVASCULAR: Normal S1, S2. No m/r/g RESPIRATORY: Clear to auscultation bilaterally. No wheezing noted.  BREASTS: Not Examined ABDOMEN: Defer till OR PELVIC: Not Examined     Assessment:  1.  Abnormal uterine bleeding; endometrial polyp fragment on biopsy  2. Dysuria - Urine culture   2.  Endometrial polyp - s/p endometrial biopsy - Transvaginal ultrasound reveals slight increase in vascularization   Plan:  1.  Hysteroscopy/D&C.  Date of surgery 02/08/2016  Preoperative consent: Patient is to undergo hysteroscopy/D&C for abnormal uterine bleeding.  She is understanding of the planned procedure and is aware of and is accepting of all surgical risks which include but are not limited to bleeding, infection, pelvic organ injury with need for repair, uterine perforation, anesthesia risks, blood clots disorders, etc.  All questions are answered.  Informed consent is given.  She does ready and willing to proceed with surgery as scheduled.  2. UA and culture obtained today for UTI symptoms.  3. Start sulfamethoxazole-trimethoprim 800-160 MG BID PO x 3 days.    Cathlean Sauer, PA-S Brayton Mars, MD   I have seen, interviewed, and examined the patient in conjunction with the [facility].A. student and affirm the diagnosis and management plan. Leiloni Smithers A. Alanis Clift, MD, FACOG   Note: This dictation was prepared with Dragon dictation along with smaller phrase technology. Any transcriptional errors that result from this process are unintentional.

## 2016-02-08 NOTE — Anesthesia Postprocedure Evaluation (Signed)
Anesthesia Post Note  Patient: Monica Terry  Procedure(s) Performed: Procedure(s) (LRB): DILATATION AND CURETTAGE /HYSTEROSCOPY (N/A)  Patient location during evaluation: PACU Anesthesia Type: General Level of consciousness: awake and alert Pain management: pain level controlled Vital Signs Assessment: post-procedure vital signs reviewed and stable Respiratory status: spontaneous breathing, nonlabored ventilation, respiratory function stable and patient connected to nasal cannula oxygen Cardiovascular status: blood pressure returned to baseline and stable Postop Assessment: no signs of nausea or vomiting Anesthetic complications: no    Last Vitals:  Filed Vitals:   02/08/16 1448 02/08/16 1457  BP:  110/47  Pulse: 76 82  Temp: 36.5 C 35.9 C  Resp:  16    Last Pain:  Filed Vitals:   02/08/16 1500  PainSc: 0-No pain                 Molli Barrows

## 2016-02-10 LAB — SURGICAL PATHOLOGY

## 2016-02-17 ENCOUNTER — Ambulatory Visit (INDEPENDENT_AMBULATORY_CARE_PROVIDER_SITE_OTHER): Payer: Medicaid Other | Admitting: Obstetrics and Gynecology

## 2016-02-17 ENCOUNTER — Encounter: Payer: Self-pay | Admitting: Obstetrics and Gynecology

## 2016-02-17 VITALS — BP 110/70 | HR 86 | Ht 68.0 in | Wt 202.4 lb

## 2016-02-17 DIAGNOSIS — Z09 Encounter for follow-up examination after completed treatment for conditions other than malignant neoplasm: Secondary | ICD-10-CM

## 2016-02-17 DIAGNOSIS — N840 Polyp of corpus uteri: Secondary | ICD-10-CM

## 2016-02-17 DIAGNOSIS — N946 Dysmenorrhea, unspecified: Secondary | ICD-10-CM

## 2016-02-17 DIAGNOSIS — R109 Unspecified abdominal pain: Secondary | ICD-10-CM | POA: Diagnosis not present

## 2016-02-17 DIAGNOSIS — R319 Hematuria, unspecified: Secondary | ICD-10-CM

## 2016-02-17 DIAGNOSIS — N941 Unspecified dyspareunia: Secondary | ICD-10-CM

## 2016-02-17 DIAGNOSIS — R10A1 Flank pain, right side: Secondary | ICD-10-CM

## 2016-02-17 DIAGNOSIS — N939 Abnormal uterine and vaginal bleeding, unspecified: Secondary | ICD-10-CM

## 2016-02-17 LAB — POCT URINALYSIS DIPSTICK
Bilirubin, UA: NEGATIVE
GLUCOSE UA: NEGATIVE
KETONES UA: NEGATIVE
Leukocytes, UA: NEGATIVE
Nitrite, UA: NEGATIVE
Protein, UA: NEGATIVE
SPEC GRAV UA: 1.025
UROBILINOGEN UA: 0.2
pH, UA: 5

## 2016-02-17 NOTE — Patient Instructions (Signed)
1. Transvaginal hysterectomy bilateral salpingectomy will be scheduled for 7 weeks from now. 2. Return in 6 weeks for preop appointment 3. Gradually resume normal activities; no intercourse for 2 more weeks.

## 2016-02-17 NOTE — Progress Notes (Signed)
Chief complaint: 1. One week postop check 2. Status post hysteroscopy/D&C for abnormal uterine bleeding  Patient presents for 1 week postop check.  PATHOLOGY: DIAGNOSIS:  A. ENDOMETRIUM AND ENDOMETRIAL POLYP; DILATATION AND CURETTAGE:  - ENDOMETRIAL POLYP.  - FRAGMENTS OF MYOMETRIUM.  - ENDOCERVICAL EPITHELIUM WITH SQUAMOUS METAPLASIA.  - FRAGMENTS OF UNREMARKABLE SQUAMOUS EPITHELIUM.  - NEGATIVE FOR ATYPIA AND MALIGNANCY  Patient reports mild vaginal bleeding and right-sided discomfort. Bowel and bladder function are normal.  Findings from surgery were reviewed. Patient understands that she is a good candidate for transvaginal hysterectomy for management of chronic pelvic pain and abnormal uterine bleeding. She does wish to proceed with definitive surgical intervention  OBJECTIVE: BP 110/70 mmHg  Pulse 86  Ht 5\' 8"  (1.727 m)  Wt 202 lb 6.4 oz (91.808 kg)  BMI 30.78 kg/m2  LMP 01/30/2016  ASSESSMENT: 1. Normal postop check 1 week status post hysteroscopy/D&C 2. Endometrial polyps 3. Uterine uterine prolapse 4. Chronic pelvic pain/dyspareunia 5. Desires surgical intervention  PLAN: 1. Resume all activities as tolerated with the exception of intercourse for another 2 weeks 2. Schedule transvaginal hysterectomy bilateral salpingectomy 7 weeks from now 3. Return in 6 weeks for preop appointment.  Brayton Mars, MD  Note: This dictation was prepared with Dragon dictation along with smaller phrase technology. Any transcriptional errors that result from this process are unintentional.

## 2016-02-19 ENCOUNTER — Telehealth: Payer: Self-pay | Admitting: Obstetrics and Gynecology

## 2016-02-19 LAB — URINE CULTURE: ORGANISM ID, BACTERIA: NO GROWTH

## 2016-02-19 NOTE — Telephone Encounter (Signed)
Pt has been taking provera to control her bleeding. Has a refill of provera but unsure if she needs to continue. Has hyst scheduled for 6 weeks. Pt aware to continue until we ask mad. Aware mad will get this message mon or tuesaday.

## 2016-02-19 NOTE — Telephone Encounter (Signed)
PT CALLED AND DR DE TOLD HER TO TAKE THE PROVERA FOR 30 DAYS AND SHE HAS A REFILL SO SHE WASNT SURE IF SHE NEEDED TO CONTINUE TAKING IT, SHE IS HAVING HYSTERECTOMY THAT IS BEING SCHEDULE SO SHE JUST WASN'T SURE IF SHE NEEDED TO TAKE IT UNTIL SHE HAD HER HYSTERECTOMY OR WHAT.

## 2016-02-22 NOTE — Telephone Encounter (Signed)
Pt aware to continue with provera until surgery per mad.

## 2016-03-01 ENCOUNTER — Ambulatory Visit (INDEPENDENT_AMBULATORY_CARE_PROVIDER_SITE_OTHER): Payer: Medicaid Other | Admitting: Gastroenterology

## 2016-03-01 ENCOUNTER — Other Ambulatory Visit: Payer: Self-pay | Admitting: Gastroenterology

## 2016-03-01 ENCOUNTER — Encounter: Payer: Self-pay | Admitting: Gastroenterology

## 2016-03-01 VITALS — BP 108/64 | HR 86 | Temp 98.2°F | Ht 68.0 in | Wt 205.0 lb

## 2016-03-01 DIAGNOSIS — B182 Chronic viral hepatitis C: Secondary | ICD-10-CM

## 2016-03-01 NOTE — Progress Notes (Signed)
Primary Care Physician: Lorelee Market, MD  Primary Gastroenterologist:  Dr. Lucilla Lame  Chief Complaint  Patient presents with  . Hepatitis C    HPI: Monica Terry is a 50 y.o. female here for follow-up of her hepatitis C. The patient was treated with hepatitis C medication for 12 weeks but due to a family emergency she only took it for 8 weeks. The patient states that her mother got sick and and eventually died and she did not have the medication with her.  Current Outpatient Prescriptions  Medication Sig Dispense Refill  . albuterol (ACCUNEB) 1.25 MG/3ML nebulizer solution Take 1 ampule by nebulization every 6 (six) hours as needed for wheezing.    Marland Kitchen albuterol (PROAIR HFA) 108 (90 BASE) MCG/ACT inhaler Inhale into the lungs.    Marland Kitchen alendronate (FOSAMAX) 70 MG tablet Take 70 mg by mouth once a week. Take with a full glass of water on an empty stomach.    Marland Kitchen aspirin 81 MG tablet Take 81 mg by mouth daily.    . cetirizine (ZYRTEC) 10 MG chewable tablet Chew 10 mg by mouth.    . diazepam (VALIUM) 5 MG tablet Take 1 tablet (5 mg total) by mouth every 8 (eight) hours as needed for anxiety. 20 tablet 0  . Fluticasone-Salmeterol (ADVAIR DISKUS) 250-50 MCG/DOSE AEPB Inhale 1 puff into the lungs 2 (two) times daily.    Marland Kitchen gabapentin (NEURONTIN) 100 MG capsule Take 100 mg by mouth as needed.     Marland Kitchen ibuprofen (ADVIL,MOTRIN) 800 MG tablet Take 1 tablet (800 mg total) by mouth 3 (three) times daily. 30 tablet 1  . levothyroxine (SYNTHROID, LEVOTHROID) 100 MCG tablet Take 100 mcg by mouth daily before breakfast.     . medroxyPROGESTERone (PROVERA) 10 MG tablet Take 1 tablet (10 mg total) by mouth daily. 30 tablet 1  . oxyCODONE-acetaminophen (ROXICET) 5-325 MG tablet Take 1-2 tablets by mouth every 4 (four) hours as needed for moderate pain or severe pain. 30 tablet 0  . pregabalin (LYRICA) 100 MG capsule Take 100 mg by mouth as needed.     . ranitidine (ZANTAC) 150 MG capsule Take 150 mg by  mouth as needed for heartburn.    . simvastatin (ZOCOR) 20 MG tablet Take 20 mg by mouth every morning.    . tiotropium (SPIRIVA HANDIHALER) 18 MCG inhalation capsule Place 18 mcg into inhaler and inhale every morning.    Marland Kitchen tiZANidine (ZANAFLEX) 4 MG tablet Take 4 mg by mouth every 6 (six) hours as needed.     . varenicline (CHANTIX) 1 MG tablet Take 1 mg by mouth 2 (two) times daily.    Marland Kitchen LORazepam (ATIVAN) 1 MG tablet Take 1 tablet (1 mg total) by mouth every 8 (eight) hours as needed for anxiety. (Patient not taking: Reported on 03/01/2016) 12 tablet 0  . sulfamethoxazole-trimethoprim (BACTRIM DS,SEPTRA DS) 800-160 MG tablet Take 1 tablet by mouth 2 (two) times daily. Take for 3 days (Patient not taking: Reported on 03/01/2016) 14 tablet 1   No current facility-administered medications for this visit.    Allergies as of 03/01/2016  . (No Known Allergies)    ROS:  General: Negative for anorexia, weight loss, fever, chills, fatigue, weakness. ENT: Negative for hoarseness, difficulty swallowing , nasal congestion. CV: Negative for chest pain, angina, palpitations, dyspnea on exertion, peripheral edema.  Respiratory: Negative for dyspnea at rest, dyspnea on exertion, cough, sputum, wheezing.  GI: See history of present illness. GU:  Negative for dysuria, hematuria,  urinary incontinence, urinary frequency, nocturnal urination.  Endo: Negative for unusual weight change.    Physical Examination:   BP 108/64 mmHg  Pulse 86  Temp(Src) 98.2 F (36.8 C) (Oral)  Ht 5\' 8"  (1.727 m)  Wt 205 lb (92.987 kg)  BMI 31.18 kg/m2  LMP 01/30/2016  General: Well-nourished, well-developed in no acute distress.  Eyes: No icterus. Conjunctivae pink. Mouth: Oropharyngeal mucosa moist and pink , no lesions erythema or exudate. Lungs: Clear to auscultation bilaterally. Non-labored. Heart: Regular rate and rhythm, no murmurs rubs or gallops.  Abdomen: Bowel sounds are normal, nontender, nondistended, no  hepatosplenomegaly or masses, no abdominal bruits or hernia , no rebound or guarding.   Extremities: No lower extremity edema. No clubbing or deformities. Neuro: Alert and oriented x 3.  Grossly intact. Skin: Warm and dry, no jaundice.   Psych: Alert and cooperative, normal mood and affect.  Labs:    Imaging Studies: No results found.  Assessment and Plan:   Monica Terry is a 50 y.o. y/o female who has a history of hepatitis C. The patient's treatment was done with completion of only 8 elevated 12 weeks. The patient will have her labs checked off to see if she has cleared the virus. If she has cleared the virus then no further follow-up is needed. If she did not then she will be contacted with possible further treatment options.   Note: This dictation was prepared with Dragon dictation along with smaller phrase technology. Any transcriptional errors that result from this process are unintentional.

## 2016-03-07 ENCOUNTER — Encounter: Payer: Self-pay | Admitting: Gastroenterology

## 2016-03-16 LAB — HEPATIC FUNCTION PANEL
ALBUMIN: 4.1 g/dL (ref 3.5–5.5)
ALK PHOS: 66 IU/L (ref 39–117)
ALT: 14 IU/L (ref 0–32)
AST: 20 IU/L (ref 0–40)
Bilirubin Total: <0.2 mg/dL (ref 0.0–1.2)
Bilirubin, Direct: 0.07 mg/dL (ref 0.00–0.40)
Total Protein: 6.7 g/dL (ref 6.0–8.5)

## 2016-03-16 LAB — HCV RT-PCR, QUANT (NON-GRAPH)

## 2016-03-17 NOTE — Telephone Encounter (Signed)
-----   Message from Lucilla Lame, MD sent at 03/17/2016 12:16 PM EDT ----- The patient know that her labs are normal and her viral level is also on detected meaning that if she is still negative in 6 months that is considered viral clearance.

## 2016-03-17 NOTE — Telephone Encounter (Signed)
Pt notified of lab results. Pt has been added to 6 months recall list.

## 2016-03-22 ENCOUNTER — Ambulatory Visit (INDEPENDENT_AMBULATORY_CARE_PROVIDER_SITE_OTHER): Payer: Medicaid Other | Admitting: Podiatry

## 2016-03-22 ENCOUNTER — Encounter: Payer: Self-pay | Admitting: Podiatry

## 2016-03-22 ENCOUNTER — Other Ambulatory Visit: Payer: Self-pay | Admitting: Obstetrics and Gynecology

## 2016-03-22 VITALS — BP 122/76 | HR 82 | Resp 18

## 2016-03-22 DIAGNOSIS — D492 Neoplasm of unspecified behavior of bone, soft tissue, and skin: Secondary | ICD-10-CM | POA: Diagnosis not present

## 2016-03-22 DIAGNOSIS — Q828 Other specified congenital malformations of skin: Secondary | ICD-10-CM | POA: Diagnosis not present

## 2016-03-22 NOTE — Progress Notes (Signed)
   Subjective:    Patient ID: Monica Terry, female    DOB: [birth year], 50 y.o.   MRN: [id number]  HPI  50 year old female presents the also concerns of a possible wart to the bottom of the left hallux been ongoing for several months. She states the areas. Painful to take the pressure in shoe gear. She has tried over-the-counter wart treatment without any resolution. Denies any drainage or swelling. Areas painful with weightbearing or pressure. No other complaints.  She is currently being treated for toenail fungus by her primary care doctor.    Review of Systems  All other systems reviewed and are negative.      Objective:   Physical Exam General: AAO x3, NAD  Dermatological: Nails are previous discolored, dystrophic, somewhat hypertrophic. There is no tenderness to nails nails are not elongated this time. There is a hyperkeratotic lesion left foot submetatarsal 4. Upon debridement does appear to be a punctate, annular hyperkeratotic lesion. There is no evidence of verruca today. This is likely more porokeratosis. No other open lesions or pre-ulcerative lesions.  Vascular: Dorsalis Pedis artery and Posterior Tibial artery pedal pulses are 2/4 bilateral with immedate capillary fill time. Pedal hair growth present. No varicosities and no lower extremity edema present bilateral. There is no pain with calf compression, swelling, warmth, erythema.   Neruologic: Grossly intact via light touch bilateral. Vibratory intact via tuning fork bilateral. Protective threshold with Semmes Wienstein monofilament intact to all pedal sites bilateral. Patellar and Achilles deep tendon reflexes 2+ bilateral. No Babinski or clonus noted bilateral.   Musculoskeletal: Tenderness over lesion left foot second metatarsal 4. No other areas of tenderness. No gross boney pedal deformities bilateral. No pain, crepitus, or limitation noted with foot and ankle range of motion bilateral. Muscular strength 5/5 in all  groups tested bilateral.  Gait: Unassisted, Nonantalgic.      Assessment & Plan:  50 year old female left foot submetatarsal 4 porokeratosis -Treatment options discussed including all alternatives, risks, and complications -Lesion debrided without complications or bleeding -Area was cleaned followed by a doughnut pad. Salinocaine was applied followed by dry sterile dressing. -Monitor for any clinical signs or symptoms of infection and directed to call the office immediately should any occur or go to the ER. -Offloading pads dispensed -Follow-up in 3-4 weeks or sooner if any problems arise. In the meantime, encouraged to call the office with any questions, concerns, change in symptoms.   Celesta Gentile, DPM

## 2016-04-05 ENCOUNTER — Encounter
Admission: RE | Admit: 2016-04-05 | Discharge: 2016-04-05 | Disposition: A | Discharge status: Home / Self Care | Payer: Medicaid Other | Source: Ambulatory Visit | Attending: Obstetrics and Gynecology | Admitting: Obstetrics and Gynecology

## 2016-04-05 ENCOUNTER — Encounter: Payer: Self-pay | Admitting: Obstetrics and Gynecology

## 2016-04-05 ENCOUNTER — Ambulatory Visit (INDEPENDENT_AMBULATORY_CARE_PROVIDER_SITE_OTHER): Payer: Medicaid Other | Admitting: Obstetrics and Gynecology

## 2016-04-05 VITALS — BP 123/75 | HR 79 | Ht 68.0 in | Wt 203.5 lb

## 2016-04-05 DIAGNOSIS — N946 Dysmenorrhea, unspecified: Secondary | ICD-10-CM

## 2016-04-05 DIAGNOSIS — N941 Unspecified dyspareunia: Secondary | ICD-10-CM

## 2016-04-05 DIAGNOSIS — Z01812 Encounter for preprocedural laboratory examination: Secondary | ICD-10-CM | POA: Diagnosis present

## 2016-04-05 DIAGNOSIS — N814 Uterovaginal prolapse, unspecified: Secondary | ICD-10-CM

## 2016-04-05 DIAGNOSIS — N939 Abnormal uterine and vaginal bleeding, unspecified: Secondary | ICD-10-CM

## 2016-04-05 HISTORY — DX: Reserved for inherently not codable concepts without codable children: IMO0001

## 2016-04-05 HISTORY — DX: Panic disorder (episodic paroxysmal anxiety): F41.0

## 2016-04-05 HISTORY — DX: Cervicalgia: M54.2

## 2016-04-05 HISTORY — DX: Other chronic pain: G89.29

## 2016-04-05 LAB — CBC WITH DIFFERENTIAL/PLATELET
BASOS PCT: 0 %
Basophils Absolute: 0 10*3/uL (ref 0–0.1)
EOS ABS: 0.3 10*3/uL (ref 0–0.7)
Eosinophils Relative: 3 %
HEMATOCRIT: 40.6 % (ref 35.0–47.0)
Hemoglobin: 13.2 g/dL (ref 12.0–16.0)
LYMPHS ABS: 3.1 10*3/uL (ref 1.0–3.6)
Lymphocytes Relative: 24 %
MCH: 28 pg (ref 26.0–34.0)
MCHC: 32.4 g/dL (ref 32.0–36.0)
MCV: 86.4 fL (ref 80.0–100.0)
MONOS PCT: 4 %
Monocytes Absolute: 0.6 10*3/uL (ref 0.2–0.9)
NEUTROS PCT: 69 %
Neutro Abs: 8.7 10*3/uL — ABNORMAL HIGH (ref 1.4–6.5)
Platelets: 386 10*3/uL (ref 150–440)
RBC: 4.7 MIL/uL (ref 3.80–5.20)
RDW: 15.5 % — ABNORMAL HIGH (ref 11.5–14.5)
WBC: 12.7 10*3/uL — ABNORMAL HIGH (ref 3.6–11.0)

## 2016-04-05 LAB — ABO/RH: ABO/RH(D): O POS

## 2016-04-05 NOTE — Progress Notes (Signed)
Subjective:  PREOPERATIVE HISTORY AND PHYSICAL     Date of surgery: 04/11/2016 Chief complaint: 1. Uterine prolapse 2. Chronic pelvic pain 3. Abnormal uterine bleeding   Patient is a 50 y.o. G4P3011female scheduled for Transvaginal hysterectomy with bilateral salpingo-oophorectomy for management of chronic pelvic pain and abnormal uterine bleeding. On 02/17/2016 patient underwent hysteroscopy/D&C for abnormal uterine bleeding. Pathology demonstrated endometrial polyps. Patient was noted to have significant uterine prolapse. She is desiring definitive surgery at this time.   Pertinent Gynecological History: History of menorrhagia. History of dysmenorrhea and dyspareunia. History of endometrial polyps on hysteroscopy/D&C in February 2017  Menarche-age 17.  Intervals.-Monthly.  Duration of flow-7 days; heavy clots, requiring ultra tampons and pads to control soiling.  History of anemia due to her heavy periods.  History of severe dysmenorrhea; 7-8 out of 10 in intensity; patient may use upwards of 16. Ibuprofen today for controlling cramping; patient has cramps that occur outside of her cycles as well. She does have low back pain without radiation.  Long history of deep thrusting dyspareunia; she no longer has intercourse due to the pain.  Contraception: Abstinence  Discussed Blood/Blood Products: yes   Menstrual History: OB History    Gravida Para Term Preterm AB TAB SAB Ectopic Multiple Living   4 3 3  1  1   3       Menarche age:NA Patient's last menstrual period was 01/30/2016.    Past Medical History  Diagnosis Date  . Depression   . Bipolar 1 disorder ([place])   . COPD (chronic obstructive pulmonary disease) ([place])   . Sciatica   . Abnormal uterine bleeding   . Anxiety   . Hypercholesteremia   . Thyroid disease   . Brittle bones   . Neck pain   . Heart murmur   . Hypothyroidism   . GERD (gastroesophageal reflux disease)   . Arthritis     NECK  . Anemia   .  Hepatitis 2016    C    Past Surgical History  Procedure Laterality Date  . Neck surgery  2009  . Spleenectomy    . Tubal ligation    . Hysteroscopy w/d&c N/A 02/08/2016    Procedure: DILATATION AND CURETTAGE /HYSTEROSCOPY;  Surgeon: Brayton Mars, MD;  Location: [facility] ORS;  Service: Gynecology;  Laterality: N/A;    OB History  Gravida Para Term Preterm AB SAB TAB Ectopic Multiple Living  4 3 3  1 1    3     # Outcome Date GA Lbr Len/2nd Weight Sex Delivery Anes PTL Lv  4 Term 1989   6 lb 1.6 oz (2.767 kg) F Vag-Spont   Y  3 Term 1988   6 lb 12.8 oz (3.084 kg) F Vag-Spont   Y  2 Term 1986   6 lb 9.6 oz (2.994 kg) F Vag-Spont   Y  1 SAB 1985              Social History   Social History  . Marital Status: Single    Spouse Name: N/A  . Number of Children: N/A  . Years of Education: N/A   Social History Main Topics  . Smoking status: Current Every Day Smoker -- 0.50 packs/day for 41 years    Types: Cigarettes  . Smokeless tobacco: Never Used  . Alcohol Use: Yes     Comment: occas  . Drug Use: No  . Sexual Activity: Not Currently    Birth Control/ Protection: Surgical   Other Topics  Concern  . None   Social History Narrative    Family History  Problem Relation Age of Onset  . Diabetes Mother   . Heart disease Mother   . Cervical cancer Sister   . Diabetes Maternal Grandmother   . Colon cancer Maternal Grandfather   . Breast cancer Neg Hx   . Ovarian cancer Neg Hx      (Not in a hospital admission)  No Known Allergies  Review of Systems Constitutional: No recent fever/chills/sweats Respiratory: No recent cough/bronchitis Cardiovascular: No chest pain Gastrointestinal: No recent nausea/vomiting/diarrhea Genitourinary: No UTI symptoms Hematologic/lymphatic:No history of coagulopathy or recent blood thinner use    Objective:    BP 123/75 mmHg  Pulse 79  Ht 5\' 8"  (1.727 m)  Wt 203 lb 8 oz (92.307 kg)  BMI 30.95 kg/m2  LMP 01/30/2016  General:    Normal  Skin:   normal  HEENT:  Normal  Neck:  Supple without Adenopathy or Thyromegaly  Lungs:   Heart:              Breasts:   Abdomen:  Pelvis:  M/S   Extremeties:  Neuro:    clear to auscultation bilaterally   Normal without murmur   Not Examined   soft, non-tender; bowel sounds normal; no masses,  no organomegaly   Exam deferred to OR  No CVAT  Warm/Dry   Normal          Assessment:   1. Abnormal uterine bleeding 2. Chronic pelvic pain 3. History of endometrial polyps 4. Uterine prolapse    Plan:   1. TVH with bilateral salpingo-oophorectomy  Preoperative counseling: The patient is to undergo transvaginal hysterectomy with bilateral salpingo-oophorectomy on 04/11/2016. She is understanding of the planned procedure and is aware of and is accepting of all surgical risks which include but are not limited to bleeding, infection, pelvic organ injury with need for repair, blood clot disorders, anesthesia risks, etc. All questions have been answered. Informed consent is given. Patient is ready and willing to proceed with surgery as scheduled. The patient does understand that if the ovaries cannot be removed safely, they will be left in situ.  Brayton Mars, MD  Note: This dictation was prepared with Dragon dictation along with smaller phrase technology. Any transcriptional errors that result from this process are unintentional.

## 2016-04-05 NOTE — H&P (Signed)
Subjective:  PREOPERATIVE HISTORY AND PHYSICAL     Date of surgery: 04/11/2016 Chief complaint: 1. Uterine prolapse 2. Chronic pelvic pain 3. Abnormal uterine bleeding   Patient is a 50 y.o. G4P3025female scheduled for Transvaginal hysterectomy with bilateral salpingo-oophorectomy for management of chronic pelvic pain and abnormal uterine bleeding. On 02/17/2016 patient underwent hysteroscopy/D&C for abnormal uterine bleeding. Pathology demonstrated endometrial polyps. Patient was noted to have significant uterine prolapse. She is desiring definitive surgery at this time.   Pertinent Gynecological History: History of menorrhagia. History of dysmenorrhea and dyspareunia. History of endometrial polyps on hysteroscopy/D&C in February 2017  Menarche-age 17.  Intervals.-Monthly.  Duration of flow-7 days; heavy clots, requiring ultra tampons and pads to control soiling.  History of anemia due to her heavy periods.  History of severe dysmenorrhea; 7-8 out of 10 in intensity; patient may use upwards of 16. Ibuprofen today for controlling cramping; patient has cramps that occur outside of her cycles as well. She does have low back pain without radiation.  Long history of deep thrusting dyspareunia; she no longer has intercourse due to the pain.  Contraception: Abstinence  Discussed Blood/Blood Products: yes   Menstrual History: OB History    Gravida Para Term Preterm AB TAB SAB Ectopic Multiple Living   4 3 3  1  1   3       Menarche age:NA Patient's last menstrual period was 01/30/2016.    Past Medical History  Diagnosis Date  . Depression   . Bipolar 1 disorder ([place])   . COPD (chronic obstructive pulmonary disease) ([place])   . Sciatica   . Abnormal uterine bleeding   . Anxiety   . Hypercholesteremia   . Thyroid disease   . Brittle bones   . Neck pain   . Heart murmur   . Hypothyroidism   . GERD (gastroesophageal reflux disease)   . Arthritis     NECK  . Anemia   .  Hepatitis 2016    C    Past Surgical History  Procedure Laterality Date  . Neck surgery  2009  . Spleenectomy    . Tubal ligation    . Hysteroscopy w/d&c N/A 02/08/2016    Procedure: DILATATION AND CURETTAGE /HYSTEROSCOPY;  Surgeon: Brayton Mars, MD;  Location: [facility] ORS;  Service: Gynecology;  Laterality: N/A;    OB History  Gravida Para Term Preterm AB SAB TAB Ectopic Multiple Living  4 3 3  1 1    3     # Outcome Date GA Lbr Len/2nd Weight Sex Delivery Anes PTL Lv  4 Term 1989   6 lb 1.6 oz (2.767 kg) F Vag-Spont   Y  3 Term 1988   6 lb 12.8 oz (3.084 kg) F Vag-Spont   Y  2 Term 1986   6 lb 9.6 oz (2.994 kg) F Vag-Spont   Y  1 SAB 1985              Social History   Social History  . Marital Status: Single    Spouse Name: N/A  . Number of Children: N/A  . Years of Education: N/A   Social History Main Topics  . Smoking status: Current Every Day Smoker -- 0.50 packs/day for 41 years    Types: Cigarettes  . Smokeless tobacco: Never Used  . Alcohol Use: Yes     Comment: occas  . Drug Use: No  . Sexual Activity: Not Currently    Birth Control/ Protection: Surgical   Other Topics  Concern  . None   Social History Narrative    Family History  Problem Relation Age of Onset  . Diabetes Mother   . Heart disease Mother   . Cervical cancer Sister   . Diabetes Maternal Grandmother   . Colon cancer Maternal Grandfather   . Breast cancer Neg Hx   . Ovarian cancer Neg Hx      (Not in a hospital admission)  No Known Allergies  Review of Systems Constitutional: No recent fever/chills/sweats Respiratory: No recent cough/bronchitis Cardiovascular: No chest pain Gastrointestinal: No recent nausea/vomiting/diarrhea Genitourinary: No UTI symptoms Hematologic/lymphatic:No history of coagulopathy or recent blood thinner use    Objective:    BP 123/75 mmHg  Pulse 79  Ht 5\' 8"  (1.727 m)  Wt 203 lb 8 oz (92.307 kg)  BMI 30.95 kg/m2  LMP 01/30/2016  General:    Normal  Skin:   normal  HEENT:  Normal  Neck:  Supple without Adenopathy or Thyromegaly  Lungs:   Heart:              Breasts:   Abdomen:  Pelvis:  M/S   Extremeties:  Neuro:    clear to auscultation bilaterally   Normal without murmur   Not Examined   soft, non-tender; bowel sounds normal; no masses,  no organomegaly   Exam deferred to OR  No CVAT  Warm/Dry   Normal          Assessment:   1. Abnormal uterine bleeding 2. Chronic pelvic pain 3. History of endometrial polyps 4. Uterine prolapse    Plan:   1. TVH with bilateral salpingo-oophorectomy  Preoperative counseling: The patient is to undergo transvaginal hysterectomy with bilateral salpingo-oophorectomy on 04/11/2016. She is understanding of the planned procedure and is aware of and is accepting of all surgical risks which include but are not limited to bleeding, infection, pelvic organ injury with need for repair, blood clot disorders, anesthesia risks, etc. All questions have been answered. Informed consent is given. Patient is ready and willing to proceed with surgery as scheduled. The patient does understand that if the ovaries cannot be removed safely, they will be left in situ.  Brayton Mars, MD  Note: This dictation was prepared with Dragon dictation along with smaller phrase technology. Any transcriptional errors that result from this process are unintentional.

## 2016-04-05 NOTE — Patient Instructions (Signed)
  Your procedure is scheduled on: 04/11/16 Mon Report to Day Surgery.2nd floor medical To find out your arrival time please call [phone] between 1PM - 3PM on 04/08/16 Fri.  Remember: Instructions that are not followed completely may result in serious medical risk, up to and including death, or upon the discretion of your surgeon and anesthesiologist your surgery may need to be rescheduled.    _x___ 1. Do not eat food or drink liquids after midnight. No gum chewing or hard candies.     _x___ 2. No Alcohol for 24 hours before or after surgery.   ____ 3. Bring all medications with you on the day of surgery if instructed.    __x__ 4. Notify your doctor if there is any change in your medical condition     (cold, fever, infections).     Do not wear jewelry, make-up, hairpins, clips or nail polish.  Do not wear lotions, powders, or perfumes. You may wear deodorant.  Do not shave 48 hours prior to surgery. Men may shave face and neck.  Do not bring valuables to the hospital.    [facility] is not responsible for any belongings or valuables.               Contacts, dentures or bridgework may not be worn into surgery.  Leave your suitcase in the car. After surgery it may be brought to your room.  For patients admitted to the hospital, discharge time is determined by your                treatment team.   Patients discharged the day of surgery will not be allowed to drive home.   Please read over the following fact sheets that you were given:      __x__ Take these medicines the morning of surgery with A SIP OF WATER:    1. albuterol (PROAIR HFA) 108 (90 BASE) MCG/ACT inhaler  2. diazepam (VALIUM) 5 MG tablet if needed  3. Fluticasone-Salmeterol (ADVAIR DISKUS) 250-50 MCG/DOSE AEPB  4.levothyroxine (SYNTHROID, LEVOTHROID) 150 MCG tablet  5.LORazepam (ATIVAN) 1 MG tablet if needed  6.oxyCODONE-acetaminophen (ROXICET) 5-325 MG tablet if needed  7.ranitidine (ZANTAC) 150 MG  capsule  8.simvastatin (ZOCOR) 20 MG tablet  9.tiotropium (SPIRIVA HANDIHALER) 18 MCG inhalation capsule  ____ Fleet Enema (as directed)   __x__ Use CHG Soap as directed  _x__ Use inhalers on the day of surgery  ____ Stop metformin 2 days prior to surgery    ____ Take 1/2 of usual insulin dose the night before surgery and none on the morning of surgery.   _x___ Stop Coumadin/Plavix/aspirin on Stop aspirin today  _x___ Stop Anti-inflammatories on stop ibuprofen today, may take Tylenol as needed   ____ Stop supplements until after surgery.    ____ Bring C-Pap to the hospital.

## 2016-04-06 LAB — TYPE AND SCREEN
ABO/RH(D): O POS
Antibody Screen: NEGATIVE

## 2016-04-07 ENCOUNTER — Ambulatory Visit (INDEPENDENT_AMBULATORY_CARE_PROVIDER_SITE_OTHER): Payer: Medicaid Other | Admitting: Podiatry

## 2016-04-07 ENCOUNTER — Encounter: Payer: Self-pay | Admitting: Podiatry

## 2016-04-07 VITALS — BP 118/59 | HR 88 | Resp 18

## 2016-04-07 DIAGNOSIS — D492 Neoplasm of unspecified behavior of bone, soft tissue, and skin: Secondary | ICD-10-CM | POA: Diagnosis not present

## 2016-04-07 DIAGNOSIS — Q828 Other specified congenital malformations of skin: Secondary | ICD-10-CM

## 2016-04-10 NOTE — Progress Notes (Signed)
Patient ID: Monica Terry, female   DOB: [birth year], 50 y.o.   MRN: [id number]  Subjective: 50 year old female presents the office in follow-up with vibration a porokeratosis left foot submetatarsal 4. She states that the areas in the family. Compared to last appointment although small area does remain. No redness or drainage. No complications out of the previous treatment. Denies any systemic complaints such as fevers, chills, nausea, vomiting. No acute changes since last appointment, and no other complaints at this time.   Objective: AAO x3, NAD DP/PT pulses palpable bilaterally, CRT less than 3 seconds Small annular hyperkeratotic lesion left foot second metatarsal 4.  Upon debridement no underlying ulceration and there is no evidence of verruca. This. A porokeratosis. No areas of pinpoint bony tenderness or pain with vibratory sensation. MMT 5/5, ROM WNL. No edema, erythema, increase in warmth to bilateral lower extremities.  No open lesions or pre-ulcerative lesions.  No pain with calf compression, swelling, warmth, erythema  Assessment: Left second metatarsal 4 porokeratosis  Plan: -All treatment options discussed with the patient including all alternatives, risks, complications.  -Lesion was debrided without consultations or bleeding. The areas clean. Salicylic acid was applied followed by an occlusive bandage. Post procedure instructions discussed. Follow-up in 3-4 weeks if symptoms continue or sooner if any issues are to arise. -Patient encouraged to call the office with any questions, concerns, change in symptoms.   Celesta Gentile, DPM

## 2016-04-11 ENCOUNTER — Ambulatory Visit: Payer: Medicaid Other | Admitting: Anesthesiology

## 2016-04-11 ENCOUNTER — Encounter
Admission: RE | Disposition: A | Discharge status: Home / Self Care | Payer: Self-pay | Source: Ambulatory Visit | Attending: Obstetrics and Gynecology

## 2016-04-11 ENCOUNTER — Encounter: Payer: Self-pay | Admitting: *Deleted

## 2016-04-11 ENCOUNTER — Observation Stay
Admission: RE | Admit: 2016-04-11 | Discharge: 2016-04-12 | Disposition: A | Discharge status: Home / Self Care | Payer: Medicaid Other | Source: Ambulatory Visit | Attending: Obstetrics and Gynecology | Admitting: Obstetrics and Gynecology

## 2016-04-11 DIAGNOSIS — E7800 Pure hypercholesterolemia, unspecified: Secondary | ICD-10-CM | POA: Diagnosis not present

## 2016-04-11 DIAGNOSIS — Z9049 Acquired absence of other specified parts of digestive tract: Secondary | ICD-10-CM | POA: Insufficient documentation

## 2016-04-11 DIAGNOSIS — Z79891 Long term (current) use of opiate analgesic: Secondary | ICD-10-CM | POA: Insufficient documentation

## 2016-04-11 DIAGNOSIS — N838 Other noninflammatory disorders of ovary, fallopian tube and broad ligament: Secondary | ICD-10-CM | POA: Insufficient documentation

## 2016-04-11 DIAGNOSIS — N939 Abnormal uterine and vaginal bleeding, unspecified: Secondary | ICD-10-CM | POA: Diagnosis not present

## 2016-04-11 DIAGNOSIS — G8929 Other chronic pain: Secondary | ICD-10-CM | POA: Diagnosis not present

## 2016-04-11 DIAGNOSIS — F17210 Nicotine dependence, cigarettes, uncomplicated: Secondary | ICD-10-CM | POA: Insufficient documentation

## 2016-04-11 DIAGNOSIS — E039 Hypothyroidism, unspecified: Secondary | ICD-10-CM | POA: Diagnosis not present

## 2016-04-11 DIAGNOSIS — Z9889 Other specified postprocedural states: Secondary | ICD-10-CM | POA: Insufficient documentation

## 2016-04-11 DIAGNOSIS — J449 Chronic obstructive pulmonary disease, unspecified: Secondary | ICD-10-CM | POA: Diagnosis not present

## 2016-04-11 DIAGNOSIS — N814 Uterovaginal prolapse, unspecified: Secondary | ICD-10-CM | POA: Diagnosis not present

## 2016-04-11 DIAGNOSIS — B1920 Unspecified viral hepatitis C without hepatic coma: Secondary | ICD-10-CM | POA: Insufficient documentation

## 2016-04-11 DIAGNOSIS — N800 Endometriosis of uterus: Secondary | ICD-10-CM | POA: Diagnosis not present

## 2016-04-11 DIAGNOSIS — Q780 Osteogenesis imperfecta: Secondary | ICD-10-CM | POA: Diagnosis not present

## 2016-04-11 DIAGNOSIS — N949 Unspecified condition associated with female genital organs and menstrual cycle: Secondary | ICD-10-CM

## 2016-04-11 DIAGNOSIS — Z791 Long term (current) use of non-steroidal anti-inflammatories (NSAID): Secondary | ICD-10-CM | POA: Insufficient documentation

## 2016-04-11 DIAGNOSIS — M479 Spondylosis, unspecified: Secondary | ICD-10-CM | POA: Diagnosis not present

## 2016-04-11 DIAGNOSIS — Z90710 Acquired absence of both cervix and uterus: Secondary | ICD-10-CM

## 2016-04-11 DIAGNOSIS — F419 Anxiety disorder, unspecified: Secondary | ICD-10-CM | POA: Diagnosis not present

## 2016-04-11 DIAGNOSIS — F319 Bipolar disorder, unspecified: Secondary | ICD-10-CM | POA: Diagnosis not present

## 2016-04-11 DIAGNOSIS — K219 Gastro-esophageal reflux disease without esophagitis: Secondary | ICD-10-CM | POA: Diagnosis not present

## 2016-04-11 DIAGNOSIS — Z79899 Other long term (current) drug therapy: Secondary | ICD-10-CM | POA: Insufficient documentation

## 2016-04-11 DIAGNOSIS — R102 Pelvic and perineal pain: Secondary | ICD-10-CM | POA: Insufficient documentation

## 2016-04-11 DIAGNOSIS — Z7951 Long term (current) use of inhaled steroids: Secondary | ICD-10-CM | POA: Insufficient documentation

## 2016-04-11 DIAGNOSIS — Z7982 Long term (current) use of aspirin: Secondary | ICD-10-CM | POA: Insufficient documentation

## 2016-04-11 HISTORY — PX: SALPINGOOPHORECTOMY: SHX82

## 2016-04-11 HISTORY — PX: VAGINAL HYSTERECTOMY: SHX2639

## 2016-04-11 LAB — POCT PREGNANCY, URINE: PREG TEST UR: NEGATIVE

## 2016-04-11 SURGERY — HYSTERECTOMY, VAGINAL
Anesthesia: General | Laterality: Bilateral | Wound class: Clean Contaminated

## 2016-04-11 MED ORDER — FENTANYL CITRATE (PF) 100 MCG/2ML IJ SOLN
INTRAMUSCULAR | Status: DC | PRN
  Administered 2016-04-11 (×2): 50 ug via INTRAVENOUS
  Administered 2016-04-11: 100 ug via INTRAVENOUS
  Administered 2016-04-11: 50 ug via INTRAVENOUS

## 2016-04-11 MED ORDER — OXYCODONE HCL 5 MG PO TABS: 5.0000 mg | ORAL_TABLET | Freq: Once | ORAL | Status: DC | PRN

## 2016-04-11 MED ORDER — IPRATROPIUM-ALBUTEROL 0.5-2.5 (3) MG/3ML IN SOLN
RESPIRATORY_TRACT | Status: AC
  Administered 2016-04-11: 3 mL via RESPIRATORY_TRACT
  Filled 2016-04-11: qty 3

## 2016-04-11 MED ORDER — OXYCODONE HCL 5 MG/5ML PO SOLN: 5.0000 mg | Freq: Once | ORAL | Status: DC | PRN

## 2016-04-11 MED ORDER — MIDAZOLAM HCL 2 MG/2ML IJ SOLN
INTRAMUSCULAR | Status: DC | PRN
  Administered 2016-04-11: 2 mg via INTRAVENOUS

## 2016-04-11 MED ORDER — CEFAZOLIN SODIUM-DEXTROSE 2-4 GM/100ML-% IV SOLN
INTRAVENOUS | Status: AC
  Administered 2016-04-11: 2000 mg via INTRAVENOUS
  Filled 2016-04-11: qty 100

## 2016-04-11 MED ORDER — FENTANYL CITRATE (PF) 100 MCG/2ML IJ SOLN
25.0000 ug | INTRAMUSCULAR | Status: DC | PRN
  Administered 2016-04-11: 50 ug via INTRAVENOUS
  Administered 2016-04-11: 25 ug via INTRAVENOUS
  Administered 2016-04-11: 50 ug via INTRAVENOUS
  Administered 2016-04-11: 25 ug via INTRAVENOUS

## 2016-04-11 MED ORDER — KETOROLAC TROMETHAMINE 30 MG/ML IJ SOLN
30.0000 mg | Freq: Four times a day (QID) | INTRAMUSCULAR | Status: AC
  Administered 2016-04-11 – 2016-04-12 (×3): 30 mg via INTRAVENOUS
  Filled 2016-04-11 (×3): qty 1

## 2016-04-11 MED ORDER — OXYCODONE-ACETAMINOPHEN 5-325 MG PO TABS
1.0000 | ORAL_TABLET | ORAL | Status: DC | PRN
  Administered 2016-04-11 – 2016-04-12 (×5): 2 via ORAL
  Filled 2016-04-11 (×5): qty 2

## 2016-04-11 MED ORDER — FENTANYL CITRATE (PF) 100 MCG/2ML IJ SOLN
25.0000 ug | INTRAMUSCULAR | Status: AC | PRN
  Administered 2016-04-11 (×2): 25 ug via INTRAVENOUS

## 2016-04-11 MED ORDER — HYDROMORPHONE HCL 1 MG/ML IJ SOLN
0.5000 mg | INTRAMUSCULAR | Status: DC | PRN
  Administered 2016-04-11 (×2): 0.5 mg via INTRAVENOUS

## 2016-04-11 MED ORDER — SIMETHICONE 80 MG PO CHEW: 80.0000 mg | CHEWABLE_TABLET | Freq: Four times a day (QID) | ORAL | Status: DC | PRN

## 2016-04-11 MED ORDER — ONDANSETRON HCL 4 MG/2ML IJ SOLN
4.0000 mg | Freq: Four times a day (QID) | INTRAMUSCULAR | Status: DC | PRN
  Administered 2016-04-11: 4 mg via INTRAVENOUS
  Filled 2016-04-11: qty 2

## 2016-04-11 MED ORDER — FENTANYL CITRATE (PF) 100 MCG/2ML IJ SOLN
INTRAMUSCULAR | Status: AC
  Administered 2016-04-11: 25 ug via INTRAVENOUS
  Filled 2016-04-11: qty 2

## 2016-04-11 MED ORDER — DOCUSATE SODIUM 100 MG PO CAPS
100.0000 mg | ORAL_CAPSULE | Freq: Two times a day (BID) | ORAL | Status: DC
  Administered 2016-04-11: 100 mg via ORAL
  Filled 2016-04-11: qty 1

## 2016-04-11 MED ORDER — PROPOFOL 10 MG/ML IV BOLUS
INTRAVENOUS | Status: DC | PRN
  Administered 2016-04-11: 150 mg via INTRAVENOUS

## 2016-04-11 MED ORDER — LACTATED RINGERS IV SOLN
INTRAVENOUS | Status: DC
  Administered 2016-04-11: 10:00:00 via INTRAVENOUS

## 2016-04-11 MED ORDER — KETOROLAC TROMETHAMINE 30 MG/ML IJ SOLN: 30.0000 mg | Freq: Four times a day (QID) | INTRAMUSCULAR | Status: AC

## 2016-04-11 MED ORDER — KETOROLAC TROMETHAMINE 30 MG/ML IJ SOLN: 30.0000 mg | Freq: Four times a day (QID) | INTRAMUSCULAR | Status: DC

## 2016-04-11 MED ORDER — LACTATED RINGERS IV SOLN: INTRAVENOUS | Status: DC

## 2016-04-11 MED ORDER — MORPHINE SULFATE (PF) 2 MG/ML IV SOLN
1.0000 mg | INTRAVENOUS | Status: DC | PRN
  Administered 2016-04-11: 1 mg via INTRAVENOUS
  Administered 2016-04-11: 2 mg via INTRAVENOUS
  Administered 2016-04-12: 1 mg via INTRAVENOUS
  Administered 2016-04-12 (×2): 2 mg via INTRAVENOUS
  Filled 2016-04-11 (×5): qty 1

## 2016-04-11 MED ORDER — ONDANSETRON HCL 4 MG PO TABS: 4.0000 mg | ORAL_TABLET | Freq: Four times a day (QID) | ORAL | Status: DC | PRN

## 2016-04-11 MED ORDER — FENTANYL CITRATE (PF) 100 MCG/2ML IJ SOLN
INTRAMUSCULAR | Status: AC
  Administered 2016-04-11: 50 ug via INTRAVENOUS
  Filled 2016-04-11: qty 2

## 2016-04-11 MED ORDER — ONDANSETRON HCL 4 MG/2ML IJ SOLN
INTRAMUSCULAR | Status: DC | PRN
  Administered 2016-04-11: 4 mg via INTRAVENOUS

## 2016-04-11 MED ORDER — SUGAMMADEX SODIUM 200 MG/2ML IV SOLN
INTRAVENOUS | Status: DC | PRN
  Administered 2016-04-11: 150 mg via INTRAVENOUS

## 2016-04-11 MED ORDER — IPRATROPIUM-ALBUTEROL 0.5-2.5 (3) MG/3ML IN SOLN
3.0000 mL | Freq: Once | RESPIRATORY_TRACT | Status: AC
  Administered 2016-04-11: 3 mL via RESPIRATORY_TRACT

## 2016-04-11 MED ORDER — ACETAMINOPHEN 10 MG/ML IV SOLN
INTRAVENOUS | Status: AC
  Filled 2016-04-11: qty 100

## 2016-04-11 MED ORDER — ESTROGENS, CONJUGATED 0.625 MG/GM VA CREA
TOPICAL_CREAM | VAGINAL | Status: AC
  Filled 2016-04-11: qty 30

## 2016-04-11 MED ORDER — LACTATED RINGERS IV SOLN
INTRAVENOUS | Status: DC
  Administered 2016-04-11 – 2016-04-12 (×3): via INTRAVENOUS

## 2016-04-11 MED ORDER — KETOROLAC TROMETHAMINE 30 MG/ML IJ SOLN
INTRAMUSCULAR | Status: DC | PRN
  Administered 2016-04-11: 30 mg via INTRAVENOUS

## 2016-04-11 MED ORDER — SUCCINYLCHOLINE CHLORIDE 20 MG/ML IJ SOLN
INTRAMUSCULAR | Status: DC | PRN
  Administered 2016-04-11: 100 mg via INTRAVENOUS

## 2016-04-11 MED ORDER — HYDROMORPHONE HCL 1 MG/ML IJ SOLN
INTRAMUSCULAR | Status: AC
Start: 2016-04-11 — End: 2016-04-11
  Administered 2016-04-11: 0.5 mg via INTRAVENOUS
  Filled 2016-04-11: qty 1

## 2016-04-11 MED ORDER — ACETAMINOPHEN 10 MG/ML IV SOLN
INTRAVENOUS | Status: DC | PRN
  Administered 2016-04-11: 1000 mg via INTRAVENOUS

## 2016-04-11 MED ORDER — ROCURONIUM BROMIDE 100 MG/10ML IV SOLN
INTRAVENOUS | Status: DC | PRN
  Administered 2016-04-11: 20 mg via INTRAVENOUS

## 2016-04-11 MED ORDER — CEFAZOLIN SODIUM-DEXTROSE 2-4 GM/100ML-% IV SOLN
2.0000 g | INTRAVENOUS | Status: AC
  Administered 2016-04-11: 2000 mg via INTRAVENOUS

## 2016-04-11 MED ORDER — DEXAMETHASONE SODIUM PHOSPHATE 10 MG/ML IJ SOLN
INTRAMUSCULAR | Status: DC | PRN
  Administered 2016-04-11: 10 mg via INTRAVENOUS

## 2016-04-11 MED ORDER — IBUPROFEN 600 MG PO TABS
600.0000 mg | ORAL_TABLET | Freq: Four times a day (QID) | ORAL | Status: DC
  Administered 2016-04-12: 600 mg via ORAL
  Filled 2016-04-11: qty 1

## 2016-04-11 SURGICAL SUPPLY — 31 items
BAG URO DRAIN 2000ML W/SPOUT (MISCELLANEOUS) ×3 IMPLANT
CANISTER SUCT 1200ML W/VALVE (MISCELLANEOUS) ×3 IMPLANT
CATH FOLEY 2WAY  5CC 16FR (CATHETERS) ×2
CATH URTH 16FR FL 2W BLN LF (CATHETERS) ×1 IMPLANT
DRAPE PERI LITHO V/GYN (MISCELLANEOUS) ×3 IMPLANT
DRAPE SHEET LG 3/4 BI-LAMINATE (DRAPES) ×3 IMPLANT
DRAPE UNDER BUTTOCK W/FLU (DRAPES) ×3 IMPLANT
ELECT REM PT RETURN 9FT ADLT (ELECTROSURGICAL) ×3
ELECTRODE REM PT RTRN 9FT ADLT (ELECTROSURGICAL) ×1 IMPLANT
GAUZE PACK 2X3YD (MISCELLANEOUS) IMPLANT
GLOVE BIO SURGEON STRL SZ8 (GLOVE) ×12 IMPLANT
GLOVE INDICATOR 8.0 STRL GRN (GLOVE) ×3 IMPLANT
GOWN STRL REUS W/ TWL LRG LVL3 (GOWN DISPOSABLE) ×2 IMPLANT
GOWN STRL REUS W/ TWL XL LVL3 (GOWN DISPOSABLE) ×1 IMPLANT
GOWN STRL REUS W/TWL LRG LVL3 (GOWN DISPOSABLE) ×4
GOWN STRL REUS W/TWL XL LVL3 (GOWN DISPOSABLE) ×2
KIT RM TURNOVER CYSTO AR (KITS) ×3 IMPLANT
LABEL OR SOLS (LABEL) ×3 IMPLANT
NS IRRIG 500ML POUR BTL (IV SOLUTION) ×3 IMPLANT
PACK BASIN MINOR ARMC (MISCELLANEOUS) ×3 IMPLANT
PAD OB MATERNITY 4.3X12.25 (PERSONAL CARE ITEMS) ×3 IMPLANT
PAD PREP 24X41 OB/GYN DISP (PERSONAL CARE ITEMS) ×3 IMPLANT
SOL PREP PVP 2OZ (MISCELLANEOUS) ×3
SOLUTION PREP PVP 2OZ (MISCELLANEOUS) ×1 IMPLANT
SUT CHROMIC 0 CT 1 (SUTURE) ×6 IMPLANT
SUT CHROMIC 1-0 (SUTURE) ×3 IMPLANT
SUT CHROMIC 2 0 CT 1 (SUTURE) ×6 IMPLANT
SUT VIC AB 0 CT1 27 (SUTURE) ×6
SUT VIC AB 0 CT1 27XCR 8 STRN (SUTURE) ×3 IMPLANT
SUT VIC AB 0 CT1 36 (SUTURE) ×6 IMPLANT
SYRINGE 10CC LL (SYRINGE) ×3 IMPLANT

## 2016-04-11 NOTE — H&P (View-Only) (Signed)
Subjective:  PREOPERATIVE HISTORY AND PHYSICAL     Date of surgery: 04/11/2016 Chief complaint: 1. Uterine prolapse 2. Chronic pelvic pain 3. Abnormal uterine bleeding   Patient is a 50 y.o. G4P3036female scheduled for Transvaginal hysterectomy with bilateral salpingo-oophorectomy for management of chronic pelvic pain and abnormal uterine bleeding. On 02/17/2016 patient underwent hysteroscopy/D&C for abnormal uterine bleeding. Pathology demonstrated endometrial polyps. Patient was noted to have significant uterine prolapse. She is desiring definitive surgery at this time.   Pertinent Gynecological History: History of menorrhagia. History of dysmenorrhea and dyspareunia. History of endometrial polyps on hysteroscopy/D&C in February 2017  Menarche-age 76.  Intervals.-Monthly.  Duration of flow-7 days; heavy clots, requiring ultra tampons and pads to control soiling.  History of anemia due to her heavy periods.  History of severe dysmenorrhea; 7-8 out of 10 in intensity; patient may use upwards of 16. Ibuprofen today for controlling cramping; patient has cramps that occur outside of her cycles as well. She does have low back pain without radiation.  Long history of deep thrusting dyspareunia; she no longer has intercourse due to the pain.  Contraception: Abstinence  Discussed Blood/Blood Products: yes   Menstrual History: OB History    Gravida Para Term Preterm AB TAB SAB Ectopic Multiple Living   4 3 3  1  1   3       Menarche age:NA Patient's last menstrual period was 01/30/2016.    Past Medical History  Diagnosis Date  . Depression   . Bipolar 1 disorder ([place])   . COPD (chronic obstructive pulmonary disease) ([place])   . Sciatica   . Abnormal uterine bleeding   . Anxiety   . Hypercholesteremia   . Thyroid disease   . Brittle bones   . Neck pain   . Heart murmur   . Hypothyroidism   . GERD (gastroesophageal reflux disease)   . Arthritis     NECK  . Anemia   .  Hepatitis 2016    C    Past Surgical History  Procedure Laterality Date  . Neck surgery  2009  . Spleenectomy    . Tubal ligation    . Hysteroscopy w/d&c N/A 02/08/2016    Procedure: DILATATION AND CURETTAGE /HYSTEROSCOPY;  Surgeon: Brayton Mars, MD;  Location: [facility] ORS;  Service: Gynecology;  Laterality: N/A;    OB History  Gravida Para Term Preterm AB SAB TAB Ectopic Multiple Living  4 3 3  1 1    3     # Outcome Date GA Lbr Len/2nd Weight Sex Delivery Anes PTL Lv  4 Term 1989   6 lb 1.6 oz (2.767 kg) F Vag-Spont   Y  3 Term 1988   6 lb 12.8 oz (3.084 kg) F Vag-Spont   Y  2 Term 1986   6 lb 9.6 oz (2.994 kg) F Vag-Spont   Y  1 SAB 1985              Social History   Social History  . Marital Status: Single    Spouse Name: N/A  . Number of Children: N/A  . Years of Education: N/A   Social History Main Topics  . Smoking status: Current Every Day Smoker -- 0.50 packs/day for 41 years    Types: Cigarettes  . Smokeless tobacco: Never Used  . Alcohol Use: Yes     Comment: occas  . Drug Use: No  . Sexual Activity: Not Currently    Birth Control/ Protection: Surgical   Other Topics  Concern  . None   Social History Narrative    Family History  Problem Relation Age of Onset  . Diabetes Mother   . Heart disease Mother   . Cervical cancer Sister   . Diabetes Maternal Grandmother   . Colon cancer Maternal Grandfather   . Breast cancer Neg Hx   . Ovarian cancer Neg Hx      (Not in a hospital admission)  No Known Allergies  Review of Systems Constitutional: No recent fever/chills/sweats Respiratory: No recent cough/bronchitis Cardiovascular: No chest pain Gastrointestinal: No recent nausea/vomiting/diarrhea Genitourinary: No UTI symptoms Hematologic/lymphatic:No history of coagulopathy or recent blood thinner use    Objective:    BP 123/75 mmHg  Pulse 79  Ht 5\' 8"  (1.727 m)  Wt 203 lb 8 oz (92.307 kg)  BMI 30.95 kg/m2  LMP 01/30/2016  General:    Normal  Skin:   normal  HEENT:  Normal  Neck:  Supple without Adenopathy or Thyromegaly  Lungs:   Heart:              Breasts:   Abdomen:  Pelvis:  M/S   Extremeties:  Neuro:    clear to auscultation bilaterally   Normal without murmur   Not Examined   soft, non-tender; bowel sounds normal; no masses,  no organomegaly   Exam deferred to OR  No CVAT  Warm/Dry   Normal          Assessment:   1. Abnormal uterine bleeding 2. Chronic pelvic pain 3. History of endometrial polyps 4. Uterine prolapse    Plan:   1. TVH with bilateral salpingo-oophorectomy  Preoperative counseling: The patient is to undergo transvaginal hysterectomy with bilateral salpingo-oophorectomy on 04/11/2016. She is understanding of the planned procedure and is aware of and is accepting of all surgical risks which include but are not limited to bleeding, infection, pelvic organ injury with need for repair, blood clot disorders, anesthesia risks, etc. All questions have been answered. Informed consent is given. Patient is ready and willing to proceed with surgery as scheduled. The patient does understand that if the ovaries cannot be removed safely, they will be left in situ.  Brayton Mars, MD  Note: This dictation was prepared with Dragon dictation along with smaller phrase technology. Any transcriptional errors that result from this process are unintentional.

## 2016-04-11 NOTE — Anesthesia Preprocedure Evaluation (Signed)
Anesthesia Evaluation  Patient identified by MRN, date of birth, ID band Patient awake    Reviewed: Allergy & Precautions, H&P , NPO status , Patient's Chart, lab work & pertinent test results  History of Anesthesia Complications Negative for: history of anesthetic complications  Airway Mallampati: III  TM Distance: >3 FB Neck ROM: full    Dental  (+) Poor Dentition, Chipped, Missing, Upper Dentures   Pulmonary shortness of breath and with exertion, COPD, Current Smoker,    Pulmonary exam normal breath sounds clear to auscultation       Cardiovascular Exercise Tolerance: Good (-) angina+ DOE  (-) Past MI Normal cardiovascular exam Rhythm:regular Rate:Normal     Neuro/Psych PSYCHIATRIC DISORDERS Anxiety Depression Bipolar Disorder  Neuromuscular disease    GI/Hepatic GERD  Controlled,(+) Hepatitis -, C  Endo/Other  Hypothyroidism Hyperthyroidism   Renal/GU negative Renal ROS  negative genitourinary   Musculoskeletal   Abdominal   Peds  Hematology negative hematology ROS (+)   Anesthesia Other Findings Past Medical History:   Depression                                                   Bipolar 1 disorder ([facility])                                     COPD (chronic obstructive pulmonary disease) (*              Sciatica                                                     Abnormal uterine bleeding                                    Anxiety                                                      Hypercholesteremia                                           Thyroid disease                                              Brittle bones                                                Neck pain  Heart murmur                                                 Hypothyroidism                                               GERD (gastroesophageal reflux disease)                      Arthritis                                                      Comment:NECK   Anemia                                                       Hepatitis                                       2016           Comment:C   Neck pain, chronic                                           Panic attack                                                 Shortness of breath dyspnea                                 Past Surgical History:   NECK SURGERY                                     2009           Comment:failed fusion   spleenectomy                                                  TUBAL LIGATION                                                HYSTEROSCOPY W/D&C  N/A 02/08/2016      Comment:Procedure: DILATATION AND CURETTAGE               /HYSTEROSCOPY;  Surgeon: Brayton Mars,               MD;  Location: [facility] ORS;  Service: Gynecology;               Laterality: N/A;     Reproductive/Obstetrics negative OB ROS                             Anesthesia Physical Anesthesia Plan  ASA: III  Anesthesia Plan: General ETT   Post-op Pain Management:    Induction:   Airway Management Planned:   Additional Equipment:   Intra-op Plan:   Post-operative Plan:   Informed Consent: I have reviewed the patients History and Physical, chart, labs and discussed the procedure including the risks, benefits and alternatives for the proposed anesthesia with the patient or authorized representative who has indicated his/her understanding and acceptance.   Dental Advisory Given  Plan Discussed with: Anesthesiologist, CRNA and Surgeon  Anesthesia Plan Comments:         Anesthesia Quick Evaluation

## 2016-04-11 NOTE — Anesthesia Postprocedure Evaluation (Signed)
Anesthesia Post Note  Patient: Monica Terry  Procedure(s) Performed: Procedure(s) (LRB): HYSTERECTOMY VAGINAL (Bilateral) SALPINGO OOPHORECTOMY (Bilateral)  Patient location during evaluation: PACU Anesthesia Type: General Level of consciousness: awake and alert Pain management: pain level controlled Vital Signs Assessment: post-procedure vital signs reviewed and stable Respiratory status: spontaneous breathing, nonlabored ventilation, respiratory function stable and patient connected to nasal cannula oxygen Cardiovascular status: blood pressure returned to baseline and stable Postop Assessment: no signs of nausea or vomiting Anesthetic complications: no    Last Vitals:  Filed Vitals:   04/11/16 1321 04/11/16 1330  BP: 130/86   Pulse: 80 80  Temp:  36.4 C  Resp: 20 19    Last Pain:  Filed Vitals:   04/11/16 1331  PainSc: 3                  Coolidge Gossard K Sincere Liuzzi

## 2016-04-11 NOTE — Anesthesia Procedure Notes (Signed)
Procedure Name: Intubation Date/Time: 04/11/2016 10:32 AM Performed by: Jonna Clark Pre-anesthesia Checklist: Patient identified, Patient being monitored, Timeout performed, Emergency Drugs available and Suction available Patient Re-evaluated:Patient Re-evaluated prior to inductionOxygen Delivery Method: Circle system utilized Preoxygenation: Pre-oxygenation with 100% oxygen Intubation Type: IV induction Ventilation: Mask ventilation without difficulty Laryngoscope Size: Miller and 2 Grade View: Grade I Tube type: Oral Tube size: 7.0 mm Number of attempts: 1 Placement Confirmation: ETT inserted through vocal cords under direct vision,  positive ETCO2 and breath sounds checked- equal and bilateral Secured at: 21 cm Tube secured with: Tape Dental Injury: Teeth and Oropharynx as per pre-operative assessment

## 2016-04-11 NOTE — Interval H&P Note (Signed)
History and Physical Interval Note:  04/11/2016 9:41 AM  Monica Terry  has presented today for surgery, with the diagnosis of CHRONIC PELVIC PAIN, ABNORMAL UTERINE BLEEDING  The various methods of treatment have been discussed with the patient and family. After consideration of risks, benefits and other options for treatment, the patient has consented to  Procedure(s): HYSTERECTOMY VAGINAL WITH BILATERAL SALPINGO OOPHORECTOMY (Bilateral) as a surgical intervention .  The patient's history has been reviewed, patient examined, no change in status, stable for surgery.  I have reviewed the patient's chart and labs.  Questions were answered to the patient's satisfaction.     Hassell Done A Defrancesco

## 2016-04-11 NOTE — Transfer of Care (Signed)
Immediate Anesthesia Transfer of Care Note  Patient: Monica Terry  Procedure(s) Performed: Procedure(s): HYSTERECTOMY VAGINAL (Bilateral) SALPINGO OOPHORECTOMY (Bilateral)  Patient Location: PACU  Anesthesia Type:General  Level of Consciousness: sedated and responds to stimulation  Airway & Oxygen Therapy: Patient Spontanous Breathing and Patient connected to face mask oxygen  Post-op Assessment: Report given to RN and Post -op Vital signs reviewed and stable  Post vital signs: Reviewed and stable  Last Vitals:  Filed Vitals:   04/11/16 0859 04/11/16 1206  BP: 134/61 108/72  Pulse: 91 70  Temp: 36.8 C 36.9 C  Resp: 18 19    Complications: No apparent anesthesia complications

## 2016-04-11 NOTE — Op Note (Signed)
OPERATIVE NOTE:  Chiquetta Peeler PROCEDURE DATE: 04/11/2016  PREOPERATIVE DIAGNOSIS:  1. Uterine prolapse 2. Pelvic pain  POSTOPERATIVE DIAGNOSIS:  Same as above  PROCEDURE: TVH BSO  SURGEON:  Brayton Mars, MD ASSISTANTS: Dr. Marcelline Mates ANESTHESIA: General INDICATIONS: 50 y.o. [id number] with chronic pelvic pain and symptomatic pelvic relaxation, presents for definitive surgery.  FINDINGS:   1. Grossly normal uterus tubes and ovaries   I/O's:   IVF: 600 ml; U.O. 350 ml; EBL 50 ml COUNTS:  YES SPECIMENS: Uterus with cervix, bilateral fallopian tubes and ovaries ANTIBIOTIC PROPHYLAXIS:Ancef 2 grams COMPLICATIONS: None immediate  PROCEDURE IN DETAIL: Transvaginal hysterectomy bilateral salpingo-oophorectomy Patient was brought to the operating room versus placed in supine position. General endotracheal anesthesia was induced without difficulty. She was placed in the dorsal lithotomy position in stirrups. A Betadine perineal intravaginal prep and drape was performed in standard fashion. Timeout was performed. Foley catheter was placed and was draining clear yellow urine. Weighted speculum was placed in vagina. Double-tooth tenaculum was placed onto the cervix. Posterior colpotomy was made with Mayo scissors. Uterosacral ligaments were clamped cut and stick tied using 0 Vicryl suture. These were tagged. The cervix was circumscribed with the Bovie cautery to dissect the vagina and bladder off lower uterine segment. Eventually the anterior cul-de-sac was entered. Sequentially the cardinal broad ligament complexes were then clamped cut and stick tied using 0 Vicryl suture. This was carried out bilaterally to the level of the utero-ovarian ligaments which were then cross clamped cut and doubly ligated. The first tie was a free tie was second tie was a stick tie. The specimen was removed from the operative field. The adnexa were then sequentially identified and grasped with Babcock clamps. The  endometrial pelvic ligaments were clamped with curved Heaney clamp and cut. Free tie was placed on the infundibulopelvic ligament followed by a stick tie. Good hemostasis was obtained bilaterally. Once satisfied with all pedicle hemostasis, the posterior cuff was run using a baseball stitch of 0 Vicryl suture. This was followed by a peritoneum closed using 0 Vicryl suture in a pursestring manner.. Following completion of the peritoneal closure, the vagina was closed in 2-0 chromic sutures in simple interrupted fashion. Patient was then awakened mobilized extubated and taken to recovery room in satisfactory condition.  Heitor Steinhoff A. Zipporah Plants, MD, ACOG [facility]

## 2016-04-11 NOTE — Interval H&P Note (Signed)
History and Physical Interval Note:  04/11/2016 7:38 AM  Monica Terry  has presented today for surgery, with the diagnosis of CHRONIC PELVIC PAIN, ABNORMAL UTERINE BLEEDING  The various methods of treatment have been discussed with the patient and family. After consideration of risks, benefits and other options for treatment, the patient has consented to  Procedure(s): HYSTERECTOMY VAGINAL WITH BILATERAL SALPINGECTOMY (Bilateral) as a surgical intervention .  The patient's history has been reviewed, patient examined, no change in status, stable for surgery.  I have reviewed the patient's chart and labs.  Questions were answered to the patient's satisfaction.     Hassell Done A Defrancesco

## 2016-04-12 ENCOUNTER — Telehealth: Payer: Self-pay | Admitting: Obstetrics and Gynecology

## 2016-04-12 DIAGNOSIS — Z90710 Acquired absence of both cervix and uterus: Secondary | ICD-10-CM

## 2016-04-12 DIAGNOSIS — N814 Uterovaginal prolapse, unspecified: Secondary | ICD-10-CM | POA: Diagnosis not present

## 2016-04-12 LAB — HEMOGLOBIN: HEMOGLOBIN: 11.7 g/dL — AB (ref 12.0–16.0)

## 2016-04-12 MED ORDER — OXYCODONE-ACETAMINOPHEN 5-325 MG PO TABS: 1.0000 | ORAL_TABLET | ORAL | Status: DC | PRN

## 2016-04-12 MED ORDER — HYDROCOD POLST-CPM POLST ER 10-8 MG/5ML PO SUER
5.0000 mL | Freq: Four times a day (QID) | ORAL | Status: DC | PRN
  Administered 2016-04-12: 5 mL via ORAL
  Filled 2016-04-12: qty 5

## 2016-04-12 MED ORDER — DOCUSATE SODIUM 100 MG PO CAPS: 100.0000 mg | ORAL_CAPSULE | Freq: Two times a day (BID) | ORAL | Status: DC

## 2016-04-12 MED ORDER — NICOTINE 7 MG/24HR TD PT24
7.0000 mg | MEDICATED_PATCH | Freq: Every day | TRANSDERMAL | Status: DC
  Administered 2016-04-12: 7 mg via TRANSDERMAL
  Filled 2016-04-12: qty 1

## 2016-04-12 MED ORDER — NICOTINE 14 MG/24HR TD PT24: 14.0000 mg | MEDICATED_PATCH | Freq: Every day | TRANSDERMAL | Status: DC

## 2016-04-12 MED ORDER — HYDROCOD POLST-CPM POLST ER 10-8 MG/5ML PO SUER: 5.0000 mL | Freq: Two times a day (BID) | ORAL | Status: DC | PRN

## 2016-04-12 NOTE — Discharge Summary (Signed)
Physician Discharge Summary  Patient ID: Monica Terry MRN: [id number] DOB/AGE: 07/13/1966 50 y.o.  Admit date: 04/11/2016 Discharge date: 04/12/2016  Admission Diagnoses: Uterine prolapse  Discharge Diagnoses:  Same as above  Operative Procedures: Procedure(s): HYSTERECTOMY VAGINAL (Bilateral) SALPINGO OOPHORECTOMY (Bilateral)  Hospital Course: Uncomplicated.   Significant Diagnostic Studies:  Lab Results  Component Value Date   HGB 11.7* 04/12/2016   HGB 13.2 04/05/2016   HGB 11.7* 02/04/2016   Lab Results  Component Value Date   HCT 40.6 04/05/2016   HCT 36.7 02/04/2016   HCT 35.0 01/07/2016   CBC Latest Ref Rng 04/12/2016 04/05/2016 02/04/2016  WBC 3.6 - 11.0 K/uL - 12.7(H) 13.8(H)  Hemoglobin 12.0 - 16.0 g/dL 11.7(L) 13.2 11.7(L)  Hematocrit 35.0 - 47.0 % - 40.6 36.7  Platelets 150 - 440 K/uL - 386 452(H)     Discharged Condition: good  Discharge Exam: Blood pressure 121/57, pulse 94, temperature 98.2 F (36.8 C), temperature source Oral, resp. rate 18, height 5\' 8"  (1.727 m), weight 203 lb (92.08 kg), SpO2 97 %. Incision/Wound: None  Disposition: 01-Home or Self Care  Discharge Instructions    Discharge patient    Complete by:  As directed             Medication List    STOP taking these medications        medroxyPROGESTERone 10 MG tablet  Commonly known as:  PROVERA      TAKE these medications        ADVAIR DISKUS 250-50 MCG/DOSE Aepb  Generic drug:  Fluticasone-Salmeterol  Inhale 1 puff into the lungs 2 (two) times daily.     aspirin 81 MG tablet  Take 81 mg by mouth daily.     cetirizine 10 MG chewable tablet  Commonly known as:  ZYRTEC  Chew 10 mg by mouth.     diazepam 5 MG tablet  Commonly known as:  VALIUM  Take 1 tablet (5 mg total) by mouth every 8 (eight) hours as needed for anxiety.     docusate sodium 100 MG capsule  Commonly known as:  COLACE  Take 1 capsule (100 mg total) by mouth 2 (two) times daily.     FOSAMAX 70  MG tablet  Generic drug:  alendronate  Take 70 mg by mouth once a week. Take with a full glass of water on an empty stomach.     gabapentin 100 MG capsule  Commonly known as:  NEURONTIN  Take 100 mg by mouth as needed.     ibuprofen 800 MG tablet  Commonly known as:  ADVIL,MOTRIN  Take 1 tablet (800 mg total) by mouth 3 (three) times daily.     LAMISIL 250 MG tablet  Generic drug:  terbinafine  Take 250 mg by mouth daily.     levothyroxine 150 MCG tablet  Commonly known as:  SYNTHROID, LEVOTHROID  Take 150 mcg by mouth daily before breakfast.     LORazepam 1 MG tablet  Commonly known as:  ATIVAN  Take 1 tablet (1 mg total) by mouth every 8 (eight) hours as needed for anxiety.     oxyCODONE-acetaminophen 5-325 MG tablet  Commonly known as:  PERCOCET/ROXICET  Take 1-2 tablets by mouth every 4 (four) hours as needed (moderate to severe pain (when tolerating fluids)).     pregabalin 100 MG capsule  Commonly known as:  LYRICA  Take 100 mg by mouth as needed.     PROAIR HFA 108 (90 Base) MCG/ACT inhaler  Generic  drug:  albuterol  Inhale into the lungs.     albuterol 1.25 MG/3ML nebulizer solution  Commonly known as:  ACCUNEB  Take 1 ampule by nebulization every 6 (six) hours as needed for wheezing.     ranitidine 150 MG capsule  Commonly known as:  ZANTAC  Take 150 mg by mouth as needed for heartburn.     simvastatin 20 MG tablet  Commonly known as:  ZOCOR  Take 20 mg by mouth every morning.     SPIRIVA HANDIHALER 18 MCG inhalation capsule  Generic drug:  tiotropium  Place 18 mcg into inhaler and inhale every morning.     tiZANidine 4 MG tablet  Commonly known as:  ZANAFLEX  Take 4 mg by mouth every 6 (six) hours as needed.     varenicline 1 MG tablet  Commonly known as:  CHANTIX  Take 1 mg by mouth 2 (two) times daily.           Follow-up Information    Follow up with Brayton Mars, MD. Go in 1 week.   Specialties:  Obstetrics and Gynecology,  Radiology   Why:  Post Op Check   Contact information:   [address] [place] [address] [phone]       Signed: Alanda Slim Defrancesco 04/12/2016, 1:08 PM

## 2016-04-12 NOTE — Discharge Instructions (Signed)
Vaginal Hysterectomy, Care After Refer to this sheet in the next few weeks. These instructions provide you with information on caring for yourself after your procedure. Your health care provider may also give you more specific instructions. Your treatment has been planned according to current medical practices, but problems sometimes occur. Call your health care provider if you have any problems or questions after your procedure. WHAT TO EXPECT AFTER THE PROCEDURE After your procedure, it is typical to have the following:  Abdominal pain. You will be given pain medicine to control it.  Sore throat from the breathing tube that was inserted during surgery. HOME CARE INSTRUCTIONS  Only take over-the-counter or prescription medicines for pain, discomfort, or fever as directed by your health care provider.  Do not take aspirin. It can cause bleeding.  Do not drive when taking pain medicine.  Follow your health care provider's advice regarding diet, exercise, lifting, driving, and general activities.  Resume your usual diet as directed and allowed.  Get plenty of rest and sleep.  Do not douche, use tampons, or have sexual intercourse for at least 6 weeks, or until your health care provider gives you permission.  Change your bandages (dressings) as directed by your health care provider.  Monitor your temperature and notify your health care provider of a fever.  Take showers instead of baths for 2-3 weeks.  Do not drink alcohol until your health care provider gives you permission.  If you develop constipation, you may take a mild laxative with your health care provider's permission. Bran foods may help with constipation problems. Drinking enough fluids to keep your urine clear or pale yellow may help as well.  Try to have someone home with you for 1-2 weeks to help around the house.  Keep all of your follow-up appointments as directed by your health care provider. SEEK MEDICAL CARE IF:     You have swelling, redness, or increasing pain around your incision sites.  You have pus coming from your incision.  You notice a bad smell coming from your incision.  Your incision breaks open.  You feel dizzy or lightheaded.  You have pain or bleeding when you urinate.  You have persistent diarrhea.  You have persistent nausea and vomiting.  You have abnormal vaginal discharge.  You have a rash.  You have any type of abnormal reaction or develop an allergy to your medicine.  You have poor pain control with your prescribed medicine. SEEK IMMEDIATE MEDICAL CARE IF:   You have a fever.  You have severe abdominal pain.  You have chest pain.  You have shortness of breath.  You faint.  You have pain, swelling, or redness in your leg.  You have heavy vaginal bleeding with blood clots. MAKE SURE YOU:  Understand these instructions.  Will watch your condition.  Will get help right away if you are not doing well or get worse.   This information is not intended to replace advice given to you by your health care provider. Make sure you discuss any questions you have with your health care provider.   Document Released: 12/01/2011 Document Revised: 12/17/2013 Document Reviewed: 06/27/2013 Elsevier Interactive Patient Education [employer].

## 2016-04-12 NOTE — Telephone Encounter (Signed)
Patient called questioning whether or not she should continue taking provera post hysterectomy. She also wanted to know if Dr D could prescribe her the patch to help her quit smoking.Thanks

## 2016-04-12 NOTE — Telephone Encounter (Signed)
Pt aware she no longer needs provera. Ok per mad to give nicotine patch.

## 2016-04-13 LAB — SURGICAL PATHOLOGY

## 2016-04-20 ENCOUNTER — Ambulatory Visit (INDEPENDENT_AMBULATORY_CARE_PROVIDER_SITE_OTHER): Payer: Medicaid Other | Admitting: Obstetrics and Gynecology

## 2016-04-20 ENCOUNTER — Encounter: Payer: Self-pay | Admitting: Obstetrics and Gynecology

## 2016-04-20 VITALS — BP 119/72 | HR 87 | Ht 68.0 in | Wt 202.3 lb

## 2016-04-20 DIAGNOSIS — N800 Endometriosis of uterus: Secondary | ICD-10-CM

## 2016-04-20 DIAGNOSIS — R05 Cough: Secondary | ICD-10-CM

## 2016-04-20 DIAGNOSIS — R059 Cough, unspecified: Secondary | ICD-10-CM

## 2016-04-20 DIAGNOSIS — R300 Dysuria: Secondary | ICD-10-CM

## 2016-04-20 DIAGNOSIS — Z09 Encounter for follow-up examination after completed treatment for conditions other than malignant neoplasm: Secondary | ICD-10-CM

## 2016-04-20 DIAGNOSIS — N8003 Adenomyosis of the uterus: Secondary | ICD-10-CM

## 2016-04-20 DIAGNOSIS — Z90710 Acquired absence of both cervix and uterus: Secondary | ICD-10-CM

## 2016-04-20 DIAGNOSIS — N809 Endometriosis, unspecified: Secondary | ICD-10-CM

## 2016-04-20 LAB — POCT URINALYSIS DIPSTICK
Bilirubin, UA: NEGATIVE
GLUCOSE UA: NEGATIVE
Ketones, UA: NEGATIVE
NITRITE UA: NEGATIVE
PH UA: 6
PROTEIN UA: NEGATIVE
SPEC GRAV UA: 1.02
UROBILINOGEN UA: 0.2

## 2016-04-20 MED ORDER — HYDROCOD POLST-CPM POLST ER 10-8 MG/5ML PO SUER: 5.0000 mL | Freq: Two times a day (BID) | ORAL | Status: DC | PRN

## 2016-04-20 MED ORDER — DOCUSATE SODIUM 100 MG PO CAPS: 100.0000 mg | ORAL_CAPSULE | Freq: Two times a day (BID) | ORAL | Status: DC

## 2016-04-20 MED ORDER — OXYCODONE-ACETAMINOPHEN 5-325 MG PO TABS: 1.0000 | ORAL_TABLET | ORAL | Status: DC | PRN

## 2016-04-20 NOTE — Progress Notes (Signed)
GYN ENCOUNTER NOTE  Subjective:       Monica Terry is a 50 y.o. [id number] female is here for gynecologic evaluation of the following issues:  1. TVH/BSO 1 week post-op follow up   Patient reports 7/10 lower abdominal pain, well controlled with Percocet and 800mg  ibuprofen, and increase to 10/10 pain with chronic cough.  Notes improvement with cough on tussionex and patient requests refill.  She admits to varying amounts of blood with urination, noted while wiping, increased lower abdominal pressure post-voiding, urinary incontinence described as "dripping contantly" requiring round the clock pad use and constipation that has improved with colace use.  She has been eating and drinking normally, no difficultly ambulating.  Denies fever, vaginal discharge, nausea, vomiting, dysphasia, dizziness, or lower extremity edema or erythema.   Gynecologic History Patient's last menstrual period was 01/30/2016. Contraception: status post hysterectomy  Obstetric History OB History  Gravida Para Term Preterm AB SAB TAB Ectopic Multiple Living  4 3 3  1 1    3     # Outcome Date GA Lbr Len/2nd Weight Sex Delivery Anes PTL Lv  4 Term 1989   6 lb 1.6 oz (2.767 kg) F Vag-Spont   Y  3 Term 1988   6 lb 12.8 oz (3.084 kg) F Vag-Spont   Y  2 Term 1986   6 lb 9.6 oz (2.994 kg) F Vag-Spont   Y  1 SAB 1985              Past Medical History  Diagnosis Date  . Depression   . Bipolar 1 disorder ([place])   . COPD (chronic obstructive pulmonary disease) ([place])   . Sciatica   . Abnormal uterine bleeding   . Anxiety   . Hypercholesteremia   . Thyroid disease   . Brittle bones   . Neck pain   . Heart murmur   . Hypothyroidism   . GERD (gastroesophageal reflux disease)   . Arthritis     NECK  . Anemia   . Hepatitis 2016    C  . Neck pain, chronic   . Panic attack   . Shortness of breath dyspnea     Past Surgical History  Procedure Laterality Date  . Neck surgery  2009    failed fusion  . Spleenectomy     . Tubal ligation    . Hysteroscopy w/d&c N/A 02/08/2016    Procedure: DILATATION AND CURETTAGE /HYSTEROSCOPY;  Surgeon: Brayton Mars, MD;  Location: [facility] ORS;  Service: Gynecology;  Laterality: N/A;  . Vaginal hysterectomy Bilateral 04/11/2016    Procedure: HYSTERECTOMY VAGINAL;  Surgeon: Brayton Mars, MD;  Location: [facility] ORS;  Service: Gynecology;  Laterality: Bilateral;  . Salpingoophorectomy Bilateral 04/11/2016    Procedure: SALPINGO OOPHORECTOMY;  Surgeon: Brayton Mars, MD;  Location: [facility] ORS;  Service: Gynecology;  Laterality: Bilateral;    Current Outpatient Prescriptions on File Prior to Visit  Medication Sig Dispense Refill  . albuterol (ACCUNEB) 1.25 MG/3ML nebulizer solution Take 1 ampule by nebulization every 6 (six) hours as needed for wheezing.    Marland Kitchen albuterol (PROAIR HFA) 108 (90 BASE) MCG/ACT inhaler Inhale into the lungs.    Marland Kitchen alendronate (FOSAMAX) 70 MG tablet Take 70 mg by mouth once a week. Take with a full glass of water on an empty stomach.    Marland Kitchen aspirin 81 MG tablet Take 81 mg by mouth daily.    . cetirizine (ZYRTEC) 10 MG chewable tablet Chew 10 mg by mouth.    Marland Kitchen  diazepam (VALIUM) 5 MG tablet Take 1 tablet (5 mg total) by mouth every 8 (eight) hours as needed for anxiety. 20 tablet 0  . Fluticasone-Salmeterol (ADVAIR DISKUS) 250-50 MCG/DOSE AEPB Inhale 1 puff into the lungs 2 (two) times daily.    Marland Kitchen gabapentin (NEURONTIN) 100 MG capsule Take 100 mg by mouth as needed.     Marland Kitchen ibuprofen (ADVIL,MOTRIN) 800 MG tablet Take 1 tablet (800 mg total) by mouth 3 (three) times daily. (Patient taking differently: Take 800 mg by mouth every 8 (eight) hours as needed. ) 30 tablet 1  . levothyroxine (SYNTHROID, LEVOTHROID) 150 MCG tablet Take 150 mcg by mouth daily before breakfast.    . LORazepam (ATIVAN) 1 MG tablet Take 1 tablet (1 mg total) by mouth every 8 (eight) hours as needed for anxiety. 12 tablet 0  . nicotine ([employer] NICOTINE TRANSDERMAL SYS) 14 mg/24hr  patch Place 1 patch (14 mg total) onto the skin daily. 28 patch 0  . pregabalin (LYRICA) 100 MG capsule Take 100 mg by mouth as needed.     . ranitidine (ZANTAC) 150 MG capsule Take 150 mg by mouth as needed for heartburn.    . simvastatin (ZOCOR) 20 MG tablet Take 20 mg by mouth every morning.    . terbinafine (LAMISIL) 250 MG tablet Take 250 mg by mouth daily.    Marland Kitchen tiotropium (SPIRIVA HANDIHALER) 18 MCG inhalation capsule Place 18 mcg into inhaler and inhale every morning.    Marland Kitchen tiZANidine (ZANAFLEX) 4 MG tablet Take 4 mg by mouth every 6 (six) hours as needed.     . varenicline (CHANTIX) 1 MG tablet Take 1 mg by mouth 2 (two) times daily.     No current facility-administered medications on file prior to visit.    No Known Allergies  Social History   Social History  . Marital Status: Single    Spouse Name: N/A  . Number of Children: N/A  . Years of Education: N/A   Occupational History  . Not on file.   Social History Main Topics  . Smoking status: Current Every Day Smoker -- 0.25 packs/day for 41 years    Types: Cigarettes  . Smokeless tobacco: Never Used  . Alcohol Use: Yes     Comment: occas  . Drug Use: No  . Sexual Activity: Not Currently    Birth Control/ Protection: Surgical   Other Topics Concern  . Not on file   Social History Narrative    Family History  Problem Relation Age of Onset  . Diabetes Mother   . Heart disease Mother   . Cervical cancer Sister   . Diabetes Maternal Grandmother   . Colon cancer Maternal Grandfather   . Breast cancer Neg Hx   . Ovarian cancer Neg Hx     The following portions of the patient's history were reviewed and updated as appropriate: allergies, current medications, past family history, past medical history, past social history, past surgical history and problem list.  Review of Systems Review of Systems - Negative except those noted in HPI.   Review of Systems - General ROS: negative for - chills, fatigue, fever, hot  flashes, malaise or night sweats Hematological and Lymphatic ROS: negative for - bleeding problems or swollen lymph nodes Gastrointestinal ROS: negative for - abdominal pain, blood in stools, change in bowel habits and nausea/vomiting Musculoskeletal ROS: negative for - joint pain, muscle pain or muscular weakness Genito-Urinary ROS: negative for - change in menstrual cycle, dysmenorrhea, dyspareunia, dysuria, genital  discharge, genital ulcers, hematuria, incontinence, irregular/heavy menses, nocturia or pelvic painjj  Objective:   BP 119/72 mmHg  Pulse 87  Ht 5\' 8"  (1.727 m)  Wt 202 lb 4.8 oz (91.763 kg)  BMI 30.77 kg/m2  LMP 01/30/2016 CONSTITUTIONAL: Well-developed, well-nourished female in no acute distress.  HENT:  Normocephalic, atraumatic.  SKIN: Skin is warm and dry. No rash noted. Not diaphoretic. No erythema. No pallor. [place]: Alert and oriented to person, place, and time.  PSYCHIATRIC: Normal mood and affect. Normal behavior. Normal judgment and thought content. CARDIOVASCULAR: RRR, no murmur, rubs or gallops RESPIRATORY: Clear to auscultation bilaterally, breath sounds slightly diminished at lung bases   Assessment:   1. Postop check -TVH/BSO  2. Dysuria -urine culture pending  3. 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 Adenomyosis -per surgical pathology      Plan:   Discussed post-op care and management with patient.  Educated about appropriate level of activity and increasing activity gradually. Disscussed medication management including side effects and discontinuing use.  1. Tussonex prescription refilled to help prevent pain associated with cough. 2. Percocet prescription is refilled for pain control. 3. Recommend increasing stool softeners to 2 Colace twice a day to facilitate easier passage of stool. 4. Patient instructed to return in 5 weeks for final postop check  Lindsay P. Penninger, PA-S Brayton Mars, MD   I have  seen, interviewed, and examined the patient in conjunction with the [facility].A. student and affirm the diagnosis and management plan. Zaiden Ludlum A. Eriq Hufford, MD, FACOG   Note: This dictation was prepared with Dragon dictation along with smaller phrase technology. Any transcriptional errors that result from this process are unintentional.

## 2016-04-20 NOTE — Patient Instructions (Addendum)
1. Tussonex prescription is refilled 2. Percocet prescription is refilled 3. Increase stool softeners to 2 Colace twice a day 4. Return in 5 weeks for final postop check 5. Urine culture

## 2016-04-22 ENCOUNTER — Telehealth: Payer: Self-pay

## 2016-04-22 LAB — URINE CULTURE

## 2016-04-22 MED ORDER — NITROFURANTOIN MONOHYD MACRO 100 MG PO CAPS: 100.0000 mg | ORAL_CAPSULE | Freq: Two times a day (BID) | ORAL | Status: DC

## 2016-04-22 NOTE — Telephone Encounter (Signed)
Pt aware. Med erx. 

## 2016-04-22 NOTE — Telephone Encounter (Signed)
-----   Message from Brayton Mars, MD sent at 04/22/2016  9:51 AM EDT ----- Please notify - Abnormal Labs Please call in Macrobid twice a day for 7 days

## 2016-05-03 ENCOUNTER — Other Ambulatory Visit: Payer: Self-pay | Admitting: Obstetrics and Gynecology

## 2016-05-25 ENCOUNTER — Encounter: Payer: Medicaid Other | Admitting: Obstetrics and Gynecology

## 2016-05-25 ENCOUNTER — Ambulatory Visit (INDEPENDENT_AMBULATORY_CARE_PROVIDER_SITE_OTHER): Payer: Medicaid Other | Admitting: Obstetrics and Gynecology

## 2016-05-25 ENCOUNTER — Telehealth: Payer: Self-pay | Admitting: Obstetrics and Gynecology

## 2016-05-25 ENCOUNTER — Encounter: Payer: Self-pay | Admitting: Obstetrics and Gynecology

## 2016-05-25 VITALS — BP 124/73 | HR 82 | Ht 68.0 in | Wt 205.5 lb

## 2016-05-25 DIAGNOSIS — Z09 Encounter for follow-up examination after completed treatment for conditions other than malignant neoplasm: Secondary | ICD-10-CM

## 2016-05-25 DIAGNOSIS — N951 Menopausal and female climacteric states: Secondary | ICD-10-CM | POA: Insufficient documentation

## 2016-05-25 DIAGNOSIS — Z90710 Acquired absence of both cervix and uterus: Secondary | ICD-10-CM

## 2016-05-25 DIAGNOSIS — N958 Other specified menopausal and perimenopausal disorders: Secondary | ICD-10-CM

## 2016-05-25 DIAGNOSIS — E8940 Asymptomatic postprocedural ovarian failure: Secondary | ICD-10-CM | POA: Insufficient documentation

## 2016-05-25 MED ORDER — ESTRADIOL 1 MG PO TABS
1.0000 mg | ORAL_TABLET | Freq: Every day | ORAL | Status: AC
Start: 2016-05-25 — End: ?

## 2016-05-25 NOTE — Telephone Encounter (Signed)
Monica Terry called saying she was told by the pharmacy that her Rx of Estradiol needs a prior authorization before being filled. She'd like a phone call regarding this.  Pt's ph# [phone] Thank you.

## 2016-05-25 NOTE — Progress Notes (Signed)
Chief by: 1. Final postop check 2. Status post [facility] BSO  Patient presents for final postop check. Bowel function is notable for chronic mild constipation. Bladder function is normal. Patient is not experiencing any pelvic pain. She is not having any vaginal discharge or bleeding. Pink discharge stopped last week. Patient has not attempted to resume intercourse.  OBJECTIVE: BP 124/73 mmHg  Pulse 82  Ht 5\' 8"  (1.727 m)  Wt 205 lb 8 oz (93.214 kg)  BMI 31.25 kg/m2  LMP 01/30/2016 Pleasant white female in no acute distress Abdomen: Soft, nontender without organomegaly Pelvic exam: External genitalia normal BUS-normal Vagina-normal; good vault support; minimal regulation and residual suture is noted at the vaginal cuff Cervix-surgically absent Uterus-surgically absent Bimanual-no palpable masses; mild cuff tenderness, appropriate for postop state RV-normal external exam  ASSESSMENT: 1. Normal 6 week check test post [facility] BSO 2. Surgical menopause, symptomatic  PLAN: 1. Resume activities as tolerated 2. Begin estradiol 1 mg a day 3. Return in 6 weeks for follow-up exam and assessment of vasomotor symptoms  Brayton Mars, MD  Note: This dictation was prepared with Dragon dictation along with smaller phrase technology. Any transcriptional errors that result from this process are unintentional.

## 2016-05-25 NOTE — Patient Instructions (Signed)
1. Resume activities as tolerated 2. Begin estradiol 1 mg a day 3. Return in 6 weeks for follow-up on ERT and for repeat pelvic exam

## 2016-05-26 NOTE — Telephone Encounter (Signed)
Pt aware per taylor at [facility] she may get estradiol 30 day supply for 5.00. Pt is ok to purchase that.

## 2016-05-26 NOTE — Telephone Encounter (Signed)
Ms. Barmann returned Crystal's call regarding her medication Estradiol and the fact that she was told a prior Josem Kaufmann was needed. She'd like another return phone call if possible.  Pt's ph# [phone] Thank you.

## 2016-06-07 ENCOUNTER — Encounter: Payer: Self-pay | Admitting: Podiatry

## 2016-06-07 ENCOUNTER — Ambulatory Visit (INDEPENDENT_AMBULATORY_CARE_PROVIDER_SITE_OTHER): Payer: Medicaid Other | Admitting: Podiatry

## 2016-06-07 DIAGNOSIS — Q828 Other specified congenital malformations of skin: Secondary | ICD-10-CM

## 2016-06-07 DIAGNOSIS — L600 Ingrowing nail: Secondary | ICD-10-CM | POA: Diagnosis not present

## 2016-06-07 DIAGNOSIS — L989 Disorder of the skin and subcutaneous tissue, unspecified: Secondary | ICD-10-CM

## 2016-06-07 NOTE — Progress Notes (Signed)
Patient ID: Monica Terry, female   DOB: [birth year], 50 y.o.   MRN: [id number]  Subjective: 50 year old female presents the office in follow-up with vibration a porokeratosis left foot submetatarsal 4. She's had no consultations of the salicylic acid treatment. She states the areas much better however she still has some discomfort of this callus. She also states that she did have to trim her right big toenail to the toenail is thick and becoming ingrown and causing pain. She trimmed the toenail has resolved mostly however she still has some pain at the very tip of her toenail. No redness or drainage or any swelling.Denies any systemic complaints such as fevers, chills, nausea, vomiting. No acute changes since last appointment, and no other complaints at this time.   Objective: AAO x3, NAD DP/PT pulses palpable bilaterally, CRT less than 3 seconds Small annular hyperkeratotic lesion left foot second metatarsal 4. Upon reaming there is no underlying ulceration, drainage or other signs of infection. No foreign body. No evidence of verruca. Right hallux toenail is somewhat dystrophic, discolored and mildly incurvated along the medial nail border. There is no edema, erythema, drainage or pus. There is tenderness along the nail border distally. No edema, erythema, increase in warmth to bilateral lower extremities.  No open lesions or pre-ulcerative lesions.  No pain with calf compression, swelling, warmth, erythema  Assessment: Left second metatarsal 4 porokeratosis; ingrown toenail  Plan: -All treatment options discussed with the patient including all alternatives, risks, complications.  -Lesion was debrided without consultations or bleeding. The areas cleaned. Pad placed followed by salicylic acid and an occlusive bandage. Post procedure instructions discussed. Offloading pads were dispensed. -Right hallux toenail is debrided to remove the symptomatic portion of the ingrown toenail without  complications or bleeding. Monitor for any recurrence. If symptoms continue may need to have a partial nail avulsion. -Follow-up in 3-4 weeks if symptoms continue or sooner if any issues are to arise. In the meantime call any questions or concerns.   Celesta Gentile, DPM

## 2016-06-23 ENCOUNTER — Ambulatory Visit: Payer: Medicaid Other | Admitting: Podiatry

## 2016-07-06 ENCOUNTER — Ambulatory Visit: Payer: Medicaid Other | Admitting: Obstetrics and Gynecology

## 2016-07-12 ENCOUNTER — Ambulatory Visit: Payer: Medicaid Other | Admitting: Obstetrics and Gynecology

## 2016-08-23 ENCOUNTER — Emergency Department
Admission: EM | Admit: 2016-08-23 | Discharge: 2016-08-23 | Disposition: A | Discharge status: Home / Self Care | Payer: Medicaid Other | Attending: Student | Admitting: Student

## 2016-08-23 ENCOUNTER — Encounter: Payer: Self-pay | Admitting: Emergency Medicine

## 2016-08-23 DIAGNOSIS — H9203 Otalgia, bilateral: Secondary | ICD-10-CM | POA: Diagnosis present

## 2016-08-23 DIAGNOSIS — Z7982 Long term (current) use of aspirin: Secondary | ICD-10-CM | POA: Diagnosis not present

## 2016-08-23 DIAGNOSIS — H6093 Unspecified otitis externa, bilateral: Secondary | ICD-10-CM | POA: Insufficient documentation

## 2016-08-23 DIAGNOSIS — E039 Hypothyroidism, unspecified: Secondary | ICD-10-CM | POA: Diagnosis not present

## 2016-08-23 DIAGNOSIS — J449 Chronic obstructive pulmonary disease, unspecified: Secondary | ICD-10-CM | POA: Insufficient documentation

## 2016-08-23 DIAGNOSIS — F17210 Nicotine dependence, cigarettes, uncomplicated: Secondary | ICD-10-CM | POA: Insufficient documentation

## 2016-08-23 MED ORDER — CIPROFLOXACIN-DEXAMETHASONE 0.3-0.1 % OT SUSP: 4.0000 [drp] | Freq: Two times a day (BID) | OTIC | 0 refills | Status: AC

## 2016-08-23 MED ORDER — KETOROLAC TROMETHAMINE 30 MG/ML IJ SOLN
30.0000 mg | Freq: Once | INTRAMUSCULAR | Status: AC
  Administered 2016-08-23: 30 mg via INTRAMUSCULAR
  Filled 2016-08-23: qty 1

## 2016-08-23 MED ORDER — PREDNISONE 20 MG PO TABS: ORAL_TABLET | ORAL | 0 refills | Status: DC

## 2016-08-23 NOTE — ED Triage Notes (Signed)
Pt presents with left ear pain and pressure since yesterday. Pt states it is constant with occasional sharp/stabbing pain. Denies hearing loss. Pt alert & oriented with NAD noted.
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
## 2016-10-06 ENCOUNTER — Other Ambulatory Visit: Payer: Self-pay | Admitting: Anesthesiology

## 2016-10-06 ENCOUNTER — Ambulatory Visit
Admission: RE | Admit: 2016-10-06 | Discharge: 2016-10-06 | Disposition: A | Discharge status: Home / Self Care | Payer: Medicaid Other | Source: Ambulatory Visit | Attending: Family Medicine | Admitting: Family Medicine

## 2016-10-06 ENCOUNTER — Ambulatory Visit
Admission: RE | Admit: 2016-10-06 | Discharge: 2016-10-06 | Disposition: A | Discharge status: Home / Self Care | Payer: Medicaid Other | Source: Ambulatory Visit | Attending: Anesthesiology | Admitting: Anesthesiology

## 2016-10-06 DIAGNOSIS — M961 Postlaminectomy syndrome, not elsewhere classified: Secondary | ICD-10-CM | POA: Diagnosis not present

## 2016-10-06 DIAGNOSIS — M542 Cervicalgia: Secondary | ICD-10-CM

## 2016-10-06 DIAGNOSIS — Z981 Arthrodesis status: Secondary | ICD-10-CM | POA: Insufficient documentation

## 2016-11-24 ENCOUNTER — Emergency Department
Admission: EM | Admit: 2016-11-24 | Discharge: 2016-11-24 | Disposition: A | Discharge status: Home / Self Care | Payer: Medicaid Other | Attending: Emergency Medicine | Admitting: Emergency Medicine

## 2016-11-24 ENCOUNTER — Encounter: Payer: Self-pay | Admitting: Emergency Medicine

## 2016-11-24 ENCOUNTER — Emergency Department: Payer: Medicaid Other

## 2016-11-24 DIAGNOSIS — Z79899 Other long term (current) drug therapy: Secondary | ICD-10-CM | POA: Insufficient documentation

## 2016-11-24 DIAGNOSIS — J449 Chronic obstructive pulmonary disease, unspecified: Secondary | ICD-10-CM | POA: Diagnosis not present

## 2016-11-24 DIAGNOSIS — E039 Hypothyroidism, unspecified: Secondary | ICD-10-CM | POA: Insufficient documentation

## 2016-11-24 DIAGNOSIS — R05 Cough: Secondary | ICD-10-CM | POA: Diagnosis not present

## 2016-11-24 DIAGNOSIS — Z7982 Long term (current) use of aspirin: Secondary | ICD-10-CM | POA: Insufficient documentation

## 2016-11-24 DIAGNOSIS — F17210 Nicotine dependence, cigarettes, uncomplicated: Secondary | ICD-10-CM | POA: Insufficient documentation

## 2016-11-24 DIAGNOSIS — R059 Cough, unspecified: Secondary | ICD-10-CM

## 2016-11-24 DIAGNOSIS — R079 Chest pain, unspecified: Secondary | ICD-10-CM | POA: Insufficient documentation

## 2016-11-24 DIAGNOSIS — R072 Precordial pain: Secondary | ICD-10-CM | POA: Diagnosis present

## 2016-11-24 LAB — TROPONIN I: Troponin I: <0.03 ng/mL (ref <0.03)

## 2016-11-24 LAB — BASIC METABOLIC PANEL
Anion gap: 6 (ref 5–15)
BUN: 13 mg/dL (ref 6–20)
CHLORIDE: 106 mmol/L (ref 101–111)
CO2: 25 mmol/L (ref 22–32)
CREATININE: 0.73 mg/dL (ref 0.44–1.00)
Calcium: 8.8 mg/dL — ABNORMAL LOW (ref 8.9–10.3)
Glucose, Bld: 98 mg/dL (ref 65–99)
POTASSIUM: 4.1 mmol/L (ref 3.5–5.1)
SODIUM: 137 mmol/L (ref 135–145)

## 2016-11-24 LAB — CBC
HCT: 40.4 % (ref 35.0–47.0)
Hemoglobin: 13.6 g/dL (ref 12.0–16.0)
MCH: 30.8 pg (ref 26.0–34.0)
MCHC: 33.6 g/dL (ref 32.0–36.0)
MCV: 91.7 fL (ref 80.0–100.0)
PLATELETS: 320 10*3/uL (ref 150–440)
RBC: 4.41 MIL/uL (ref 3.80–5.20)
RDW: 14.2 % (ref 11.5–14.5)
WBC: 12.8 10*3/uL — AB (ref 3.6–11.0)

## 2016-11-24 LAB — FIBRIN DERIVATIVES D-DIMER (ARMC ONLY): FIBRIN DERIVATIVES D-DIMER (ARMC): 475 (ref 0–499)

## 2016-11-24 MED ORDER — HYDROCOD POLST-CPM POLST ER 10-8 MG/5ML PO SUER: 5.0000 mL | Freq: Two times a day (BID) | ORAL | 0 refills | Status: DC

## 2016-11-24 MED ORDER — OXYCODONE-ACETAMINOPHEN 5-325 MG PO TABS
2.0000 | ORAL_TABLET | Freq: Once | ORAL | Status: AC
  Administered 2016-11-24: 2 via ORAL
  Filled 2016-11-24: qty 2

## 2016-11-24 MED ORDER — PREDNISONE 10 MG (21) PO TBPK: ORAL_TABLET | ORAL | 0 refills | Status: DC

## 2016-11-24 MED ORDER — PREDNISONE 20 MG PO TABS
60.0000 mg | ORAL_TABLET | Freq: Once | ORAL | Status: AC
  Administered 2016-11-24: 60 mg via ORAL
  Filled 2016-11-24: qty 3
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
## 2016-11-24 NOTE — ED Triage Notes (Signed)
Pt with mid sternal chest pain started yesterday.

## 2016-12-26 DIAGNOSIS — A879 Viral meningitis, unspecified: Secondary | ICD-10-CM

## 2016-12-26 HISTORY — DX: Viral meningitis, unspecified: A87.9

## 2016-12-26 HISTORY — PX: OTHER SURGICAL HISTORY: SHX169

## 2017-01-02 ENCOUNTER — Encounter: Payer: Self-pay | Admitting: Emergency Medicine

## 2017-01-02 ENCOUNTER — Emergency Department
Admission: EM | Admit: 2017-01-02 | Discharge: 2017-01-02 | Disposition: A | Discharge status: Home / Self Care | Payer: Medicaid Other | Attending: Emergency Medicine | Admitting: Emergency Medicine

## 2017-01-02 DIAGNOSIS — R69 Illness, unspecified: Secondary | ICD-10-CM

## 2017-01-02 DIAGNOSIS — R51 Headache: Secondary | ICD-10-CM | POA: Diagnosis not present

## 2017-01-02 DIAGNOSIS — R05 Cough: Secondary | ICD-10-CM | POA: Diagnosis present

## 2017-01-02 DIAGNOSIS — E039 Hypothyroidism, unspecified: Secondary | ICD-10-CM | POA: Insufficient documentation

## 2017-01-02 DIAGNOSIS — R509 Fever, unspecified: Secondary | ICD-10-CM | POA: Diagnosis not present

## 2017-01-02 DIAGNOSIS — J449 Chronic obstructive pulmonary disease, unspecified: Secondary | ICD-10-CM | POA: Diagnosis not present

## 2017-01-02 DIAGNOSIS — R110 Nausea: Secondary | ICD-10-CM | POA: Diagnosis not present

## 2017-01-02 DIAGNOSIS — J029 Acute pharyngitis, unspecified: Secondary | ICD-10-CM | POA: Insufficient documentation

## 2017-01-02 DIAGNOSIS — M791 Myalgia: Secondary | ICD-10-CM | POA: Diagnosis not present

## 2017-01-02 DIAGNOSIS — Z87891 Personal history of nicotine dependence: Secondary | ICD-10-CM | POA: Diagnosis not present

## 2017-01-02 DIAGNOSIS — J111 Influenza due to unidentified influenza virus with other respiratory manifestations: Secondary | ICD-10-CM

## 2017-01-02 DIAGNOSIS — R197 Diarrhea, unspecified: Secondary | ICD-10-CM | POA: Insufficient documentation

## 2017-01-02 LAB — RAPID INFLUENZA A&B ANTIGENS (ARMC ONLY)
INFLUENZA A (ARMC): NEGATIVE
INFLUENZA B (ARMC): NEGATIVE

## 2017-01-02 MED ORDER — ONDANSETRON 4 MG PO TBDP
4.0000 mg | ORAL_TABLET | Freq: Once | ORAL | Status: AC
  Administered 2017-01-02: 4 mg via ORAL
  Filled 2017-01-02: qty 1

## 2017-01-02 MED ORDER — ONDANSETRON HCL 4 MG PO TABS: 4.0000 mg | ORAL_TABLET | Freq: Three times a day (TID) | ORAL | 0 refills | Status: DC | PRN

## 2017-01-02 MED ORDER — NAPROXEN 500 MG PO TABS: 500.0000 mg | ORAL_TABLET | Freq: Two times a day (BID) | ORAL | 0 refills | Status: DC

## 2017-01-02 MED ORDER — IBUPROFEN 800 MG PO TABS
800.0000 mg | ORAL_TABLET | Freq: Once | ORAL | Status: AC
  Administered 2017-01-02: 800 mg via ORAL
  Filled 2017-01-02: qty 1

## 2017-01-02 NOTE — ED Triage Notes (Signed)
Pt arrived via EMS from home for reports of cough and cold symptoms, fever, nausea and diarrhea. Pt reports had cold symptoms for approximately one week. Pt reports diarrhea began this morning. Pt reports headache.

## 2017-01-02 NOTE — ED Notes (Signed)
Pt states nausea, headache, fever. Pt states symptoms began at 2am. Took alka seltzer cold and flu at home at 2am. States CP this morning but denies it now. States diarrhea as well.

## 2017-01-02 NOTE — ED Triage Notes (Signed)
Pt here via [facility] with URI/flu sx. Pt was sick last week, woke up today fever, chills and nausea. EMS reports VSS.

## 2017-01-02 NOTE — ED Triage Notes (Signed)
Pt laying in floor in lobby.

## 2017-01-12 ENCOUNTER — Emergency Department: Payer: Medicaid Other

## 2017-01-12 ENCOUNTER — Inpatient Hospital Stay
Admission: EM | Admit: 2017-01-12 | Discharge: 2017-01-16 | DRG: 871 | Disposition: A | Discharge status: Home Health | Payer: Medicaid Other | Attending: Internal Medicine | Admitting: Internal Medicine

## 2017-01-12 ENCOUNTER — Encounter: Payer: Self-pay | Admitting: Emergency Medicine

## 2017-01-12 DIAGNOSIS — Z833 Family history of diabetes mellitus: Secondary | ICD-10-CM | POA: Diagnosis not present

## 2017-01-12 DIAGNOSIS — F319 Bipolar disorder, unspecified: Secondary | ICD-10-CM | POA: Diagnosis present

## 2017-01-12 DIAGNOSIS — Z8049 Family history of malignant neoplasm of other genital organs: Secondary | ICD-10-CM | POA: Diagnosis not present

## 2017-01-12 DIAGNOSIS — Z7982 Long term (current) use of aspirin: Secondary | ICD-10-CM

## 2017-01-12 DIAGNOSIS — Z7983 Long term (current) use of bisphosphonates: Secondary | ICD-10-CM | POA: Diagnosis not present

## 2017-01-12 DIAGNOSIS — G001 Pneumococcal meningitis: Secondary | ICD-10-CM | POA: Diagnosis present

## 2017-01-12 DIAGNOSIS — M898X9 Other specified disorders of bone, unspecified site: Secondary | ICD-10-CM | POA: Diagnosis present

## 2017-01-12 DIAGNOSIS — G039 Meningitis, unspecified: Secondary | ICD-10-CM | POA: Diagnosis present

## 2017-01-12 DIAGNOSIS — F3170 Bipolar disorder, currently in remission, most recent episode unspecified: Secondary | ICD-10-CM | POA: Diagnosis not present

## 2017-01-12 DIAGNOSIS — E7800 Pure hypercholesterolemia, unspecified: Secondary | ICD-10-CM | POA: Diagnosis present

## 2017-01-12 DIAGNOSIS — Z87891 Personal history of nicotine dependence: Secondary | ICD-10-CM

## 2017-01-12 DIAGNOSIS — K219 Gastro-esophageal reflux disease without esophagitis: Secondary | ICD-10-CM | POA: Diagnosis present

## 2017-01-12 DIAGNOSIS — G8929 Other chronic pain: Secondary | ICD-10-CM | POA: Diagnosis present

## 2017-01-12 DIAGNOSIS — J449 Chronic obstructive pulmonary disease, unspecified: Secondary | ICD-10-CM | POA: Diagnosis present

## 2017-01-12 DIAGNOSIS — E039 Hypothyroidism, unspecified: Secondary | ICD-10-CM | POA: Diagnosis present

## 2017-01-12 DIAGNOSIS — A403 Sepsis due to Streptococcus pneumoniae: Principal | ICD-10-CM | POA: Diagnosis present

## 2017-01-12 DIAGNOSIS — Z8249 Family history of ischemic heart disease and other diseases of the circulatory system: Secondary | ICD-10-CM | POA: Diagnosis not present

## 2017-01-12 DIAGNOSIS — R7881 Bacteremia: Secondary | ICD-10-CM | POA: Diagnosis present

## 2017-01-12 DIAGNOSIS — Z79899 Other long term (current) drug therapy: Secondary | ICD-10-CM | POA: Diagnosis not present

## 2017-01-12 DIAGNOSIS — Z800 Family history of malignant neoplasm of digestive organs: Secondary | ICD-10-CM

## 2017-01-12 DIAGNOSIS — E876 Hypokalemia: Secondary | ICD-10-CM | POA: Diagnosis present

## 2017-01-12 DIAGNOSIS — Z79890 Hormone replacement therapy (postmenopausal): Secondary | ICD-10-CM | POA: Diagnosis not present

## 2017-01-12 LAB — BLOOD CULTURE ID PANEL (REFLEXED)
ACINETOBACTER BAUMANNII: NOT DETECTED
CANDIDA PARAPSILOSIS: NOT DETECTED
CANDIDA TROPICALIS: NOT DETECTED
Candida albicans: NOT DETECTED
Candida glabrata: NOT DETECTED
Candida krusei: NOT DETECTED
Enterobacter cloacae complex: NOT DETECTED
Enterobacteriaceae species: NOT DETECTED
Enterococcus species: NOT DETECTED
Escherichia coli: NOT DETECTED
Haemophilus influenzae: NOT DETECTED
KLEBSIELLA OXYTOCA: NOT DETECTED
KLEBSIELLA PNEUMONIAE: NOT DETECTED
Listeria monocytogenes: NOT DETECTED
NEISSERIA MENINGITIDIS: NOT DETECTED
PROTEUS SPECIES: NOT DETECTED
Pseudomonas aeruginosa: NOT DETECTED
SERRATIA MARCESCENS: NOT DETECTED
STAPHYLOCOCCUS AUREUS BCID: NOT DETECTED
STAPHYLOCOCCUS SPECIES: NOT DETECTED
STREPTOCOCCUS AGALACTIAE: NOT DETECTED
STREPTOCOCCUS SPECIES: DETECTED — AB
Streptococcus pneumoniae: DETECTED — AB
Streptococcus pyogenes: NOT DETECTED

## 2017-01-12 LAB — CSF CELL COUNT WITH DIFFERENTIAL
Eosinophils, CSF: 0 %
Eosinophils, CSF: 0 %
LYMPHS CSF: 14 %
Lymphs, CSF: 13 %
Monocyte-Macrophage-Spinal Fluid: 0 %
Monocyte-Macrophage-Spinal Fluid: 0 %
RBC Count, CSF: 161 /mm3 — ABNORMAL HIGH (ref 0–3)
RBC Count, CSF: 86 /mm3 — ABNORMAL HIGH (ref 0–3)
SEGMENTED NEUTROPHILS-CSF: 87 %
Segmented Neutrophils-CSF: 86 %
TUBE #: 1
TUBE #: 4
WBC, CSF: 1231 /mm3 (ref 0–5)
WBC, CSF: 845 /mm3 (ref 0–5)

## 2017-01-12 LAB — BASIC METABOLIC PANEL
Anion gap: 10 (ref 5–15)
BUN: 13 mg/dL (ref 6–20)
CALCIUM: 8.7 mg/dL — AB (ref 8.9–10.3)
CO2: 25 mmol/L (ref 22–32)
CREATININE: 0.74 mg/dL (ref 0.44–1.00)
Chloride: 104 mmol/L (ref 101–111)
Glucose, Bld: 225 mg/dL — ABNORMAL HIGH (ref 65–99)
Potassium: 3.1 mmol/L — ABNORMAL LOW (ref 3.5–5.1)
Sodium: 139 mmol/L (ref 135–145)

## 2017-01-12 LAB — CBC
HEMATOCRIT: 39.3 % (ref 35.0–47.0)
Hemoglobin: 13.4 g/dL (ref 12.0–16.0)
MCH: 30.5 pg (ref 26.0–34.0)
MCHC: 34 g/dL (ref 32.0–36.0)
MCV: 89.5 fL (ref 80.0–100.0)
PLATELETS: 345 10*3/uL (ref 150–440)
RBC: 4.39 MIL/uL (ref 3.80–5.20)
RDW: 14.4 % (ref 11.5–14.5)
WBC: 26.4 10*3/uL — ABNORMAL HIGH (ref 3.6–11.0)

## 2017-01-12 LAB — PROTEIN AND GLUCOSE, CSF: Total  Protein, CSF: 300 mg/dL — ABNORMAL HIGH (ref 15–45)

## 2017-01-12 LAB — INFLUENZA PANEL BY PCR (TYPE A & B)
INFLBPCR: NEGATIVE
Influenza A By PCR: NEGATIVE

## 2017-01-12 LAB — PATHOLOGIST SMEAR REVIEW

## 2017-01-12 MED ORDER — KETOROLAC TROMETHAMINE 30 MG/ML IJ SOLN
30.0000 mg | Freq: Four times a day (QID) | INTRAMUSCULAR | Status: DC | PRN
  Administered 2017-01-12 – 2017-01-13 (×5): 30 mg via INTRAVENOUS
  Filled 2017-01-12 (×4): qty 1

## 2017-01-12 MED ORDER — VANCOMYCIN HCL 10 G IV SOLR
1750.0000 mg | Freq: Once | INTRAVENOUS | Status: DC
  Filled 2017-01-12: qty 1750

## 2017-01-12 MED ORDER — ALBUTEROL SULFATE (2.5 MG/3ML) 0.083% IN NEBU: 1.2500 mg | INHALATION_SOLUTION | Freq: Four times a day (QID) | RESPIRATORY_TRACT | Status: DC | PRN

## 2017-01-12 MED ORDER — AMPICILLIN SODIUM 2 G IJ SOLR
2.0000 g | INTRAMUSCULAR | Status: DC
  Administered 2017-01-12 – 2017-01-13 (×4): 2 g via INTRAVENOUS
  Filled 2017-01-12 (×10): qty 2000

## 2017-01-12 MED ORDER — ONDANSETRON HCL 4 MG PO TABS
4.0000 mg | ORAL_TABLET | Freq: Four times a day (QID) | ORAL | Status: DC | PRN
  Administered 2017-01-12: 21:00:00 4 mg via ORAL
  Filled 2017-01-12: qty 1

## 2017-01-12 MED ORDER — SIMVASTATIN 20 MG PO TABS
20.0000 mg | ORAL_TABLET | Freq: Every day | ORAL | Status: DC
  Administered 2017-01-12 – 2017-01-16 (×4): 20 mg via ORAL
  Filled 2017-01-12 (×5): qty 1

## 2017-01-12 MED ORDER — LIDOCAINE HCL (PF) 1 % IJ SOLN
INTRAMUSCULAR | Status: AC
  Administered 2017-01-12: 20 mL
  Filled 2017-01-12: qty 20

## 2017-01-12 MED ORDER — ONDANSETRON HCL 4 MG/2ML IJ SOLN
4.0000 mg | Freq: Four times a day (QID) | INTRAMUSCULAR | Status: DC | PRN
  Administered 2017-01-12 – 2017-01-13 (×2): 4 mg via INTRAVENOUS
  Filled 2017-01-12 (×2): qty 2

## 2017-01-12 MED ORDER — LEVOTHYROXINE SODIUM 75 MCG PO TABS
150.0000 ug | ORAL_TABLET | Freq: Every day | ORAL | Status: DC
  Administered 2017-01-14 – 2017-01-16 (×3): 150 ug via ORAL
  Filled 2017-01-12 (×4): qty 2

## 2017-01-12 MED ORDER — DOCUSATE SODIUM 100 MG PO CAPS
100.0000 mg | ORAL_CAPSULE | Freq: Two times a day (BID) | ORAL | Status: DC
  Administered 2017-01-12 – 2017-01-16 (×6): 100 mg via ORAL
  Filled 2017-01-12 (×8): qty 1

## 2017-01-12 MED ORDER — CEFTRIAXONE SODIUM-DEXTROSE 2-2.22 GM-% IV SOLR
2.0000 g | Freq: Once | INTRAVENOUS | Status: AC
  Administered 2017-01-12: 2 g via INTRAVENOUS
  Filled 2017-01-12: qty 50

## 2017-01-12 MED ORDER — HYDROMORPHONE HCL 1 MG/ML IJ SOLN
1.0000 mg | Freq: Once | INTRAMUSCULAR | Status: AC
  Administered 2017-01-12: 1 mg via INTRAVENOUS
  Filled 2017-01-12: qty 1

## 2017-01-12 MED ORDER — SODIUM CHLORIDE 0.9 % IV SOLN
2.0000 g | Freq: Once | INTRAVENOUS | Status: AC
  Administered 2017-01-12: 2 g via INTRAVENOUS
  Filled 2017-01-12: qty 2000

## 2017-01-12 MED ORDER — MOMETASONE FURO-FORMOTEROL FUM 200-5 MCG/ACT IN AERO
2.0000 | INHALATION_SPRAY | Freq: Two times a day (BID) | RESPIRATORY_TRACT | Status: DC
  Administered 2017-01-13 – 2017-01-16 (×6): 2 via RESPIRATORY_TRACT
  Filled 2017-01-12 (×2): qty 8.8

## 2017-01-12 MED ORDER — LORATADINE 10 MG PO TABS
10.0000 mg | ORAL_TABLET | Freq: Every day | ORAL | Status: DC
  Administered 2017-01-12 – 2017-01-16 (×4): 10 mg via ORAL
  Filled 2017-01-12 (×5): qty 1

## 2017-01-12 MED ORDER — DEXTROSE 5 % IV SOLN: 2.0000 g | Freq: Once | INTRAVENOUS | Status: DC

## 2017-01-12 MED ORDER — SENNOSIDES-DOCUSATE SODIUM 8.6-50 MG PO TABS
1.0000 | ORAL_TABLET | Freq: Every evening | ORAL | Status: DC | PRN
Start: 2017-01-12 — End: 2017-01-16

## 2017-01-12 MED ORDER — ESTRADIOL 1 MG PO TABS
1.0000 mg | ORAL_TABLET | Freq: Every day | ORAL | Status: DC
  Administered 2017-01-12 – 2017-01-16 (×4): 1 mg via ORAL
  Filled 2017-01-12 (×5): qty 1

## 2017-01-12 MED ORDER — OXYCODONE-ACETAMINOPHEN 5-325 MG PO TABS
ORAL_TABLET | ORAL | Status: AC
  Administered 2017-01-12: 2 via ORAL
  Filled 2017-01-12: qty 2

## 2017-01-12 MED ORDER — PREGABALIN 50 MG PO CAPS
100.0000 mg | ORAL_CAPSULE | Freq: Every day | ORAL | Status: DC
  Administered 2017-01-12 – 2017-01-16 (×5): 100 mg via ORAL
  Filled 2017-01-12 (×5): qty 2

## 2017-01-12 MED ORDER — VANCOMYCIN HCL 10 G IV SOLR
1500.0000 mg | Freq: Two times a day (BID) | INTRAVENOUS | Status: DC
  Administered 2017-01-12 – 2017-01-14 (×4): 1500 mg via INTRAVENOUS
  Filled 2017-01-12 (×6): qty 1500

## 2017-01-12 MED ORDER — CEFTRIAXONE SODIUM-DEXTROSE 2-2.22 GM-% IV SOLR
2.0000 g | Freq: Two times a day (BID) | INTRAVENOUS | Status: DC
  Administered 2017-01-12 – 2017-01-16 (×9): 2 g via INTRAVENOUS
  Filled 2017-01-12 (×11): qty 50

## 2017-01-12 MED ORDER — BISACODYL 5 MG PO TBEC
5.0000 mg | DELAYED_RELEASE_TABLET | Freq: Every day | ORAL | Status: DC | PRN
  Administered 2017-01-12: 5 mg via ORAL
  Filled 2017-01-12: qty 1

## 2017-01-12 MED ORDER — SODIUM CHLORIDE 0.9 % IV SOLN
30.0000 meq | Freq: Once | INTRAVENOUS | Status: AC
  Administered 2017-01-12: 30 meq via INTRAVENOUS
  Filled 2017-01-12: qty 15

## 2017-01-12 MED ORDER — KETOROLAC TROMETHAMINE 30 MG/ML IJ SOLN
INTRAMUSCULAR | Status: AC
  Administered 2017-01-12: 30 mg via INTRAVENOUS
  Filled 2017-01-12: qty 1

## 2017-01-12 MED ORDER — DEXAMETHASONE SODIUM PHOSPHATE 10 MG/ML IJ SOLN
10.0000 mg | Freq: Once | INTRAMUSCULAR | Status: AC
  Administered 2017-01-12: 10 mg via INTRAVENOUS
  Filled 2017-01-12: qty 1

## 2017-01-12 MED ORDER — ONDANSETRON HCL 4 MG PO TABS: 4.0000 mg | ORAL_TABLET | Freq: Three times a day (TID) | ORAL | Status: DC | PRN

## 2017-01-12 MED ORDER — ACETAMINOPHEN 650 MG RE SUPP: 650.0000 mg | Freq: Four times a day (QID) | RECTAL | Status: DC | PRN

## 2017-01-12 MED ORDER — METOCLOPRAMIDE HCL 5 MG/5ML PO SOLN
10.0000 mg | Freq: Once | ORAL | Status: AC
  Administered 2017-01-13: 10 mg via ORAL
  Filled 2017-01-12: qty 10

## 2017-01-12 MED ORDER — SODIUM CHLORIDE 0.9 % IV BOLUS (SEPSIS)
1000.0000 mL | Freq: Once | INTRAVENOUS | Status: AC
  Administered 2017-01-12: 1000 mL via INTRAVENOUS

## 2017-01-12 MED ORDER — ENOXAPARIN SODIUM 40 MG/0.4ML ~~LOC~~ SOLN
40.0000 mg | SUBCUTANEOUS | Status: DC
  Administered 2017-01-12 – 2017-01-16 (×4): 40 mg via SUBCUTANEOUS
  Filled 2017-01-12 (×5): qty 0.4

## 2017-01-12 MED ORDER — DIAZEPAM 5 MG PO TABS
5.0000 mg | ORAL_TABLET | Freq: Three times a day (TID) | ORAL | Status: DC | PRN
  Administered 2017-01-13: 5 mg via ORAL
  Filled 2017-01-12: qty 1

## 2017-01-12 MED ORDER — DEXTROSE 5 % IV SOLN: 2.0000 g | Freq: Two times a day (BID) | INTRAVENOUS | Status: DC

## 2017-01-12 MED ORDER — OXYCODONE-ACETAMINOPHEN 5-325 MG PO TABS
1.0000 | ORAL_TABLET | ORAL | Status: DC | PRN
  Administered 2017-01-12 (×2): 2 via ORAL
  Administered 2017-01-12: 1 via ORAL
  Administered 2017-01-13 – 2017-01-16 (×13): 2 via ORAL
  Filled 2017-01-12 (×9): qty 2
  Filled 2017-01-12: qty 1
  Filled 2017-01-12 (×5): qty 2

## 2017-01-12 MED ORDER — DEXTROSE 5 % IV SOLN
10.0000 mg/kg | Freq: Once | INTRAVENOUS | Status: AC
  Administered 2017-01-12: 905 mg via INTRAVENOUS
  Filled 2017-01-12: qty 18.1

## 2017-01-12 MED ORDER — SODIUM CHLORIDE 0.9 % IV SOLN
INTRAVENOUS | Status: DC
  Administered 2017-01-12: 16:00:00 via INTRAVENOUS
  Administered 2017-01-12: 1000 mL via INTRAVENOUS
  Administered 2017-01-13 – 2017-01-14 (×2): via INTRAVENOUS

## 2017-01-12 MED ORDER — TIOTROPIUM BROMIDE MONOHYDRATE 18 MCG IN CAPS
18.0000 ug | ORAL_CAPSULE | RESPIRATORY_TRACT | Status: DC
  Administered 2017-01-13 – 2017-01-16 (×4): 18 ug via RESPIRATORY_TRACT
  Filled 2017-01-12: qty 5

## 2017-01-12 MED ORDER — ASPIRIN EC 81 MG PO TBEC
81.0000 mg | DELAYED_RELEASE_TABLET | Freq: Every day | ORAL | Status: DC
  Administered 2017-01-12 – 2017-01-16 (×4): 81 mg via ORAL
  Filled 2017-01-12 (×5): qty 1

## 2017-01-12 MED ORDER — METOCLOPRAMIDE HCL 5 MG/ML IJ SOLN
10.0000 mg | Freq: Once | INTRAMUSCULAR | Status: AC
  Administered 2017-01-12: 10 mg via INTRAVENOUS
  Filled 2017-01-12: qty 2

## 2017-01-12 MED ORDER — ACETAMINOPHEN 325 MG PO TABS
650.0000 mg | ORAL_TABLET | Freq: Four times a day (QID) | ORAL | Status: DC | PRN
  Administered 2017-01-13: 650 mg via ORAL
  Filled 2017-01-12: qty 2

## 2017-01-12 MED ORDER — VANCOMYCIN HCL 10 G IV SOLR
1750.0000 mg | Freq: Once | INTRAVENOUS | Status: AC
  Administered 2017-01-12: 1750 mg via INTRAVENOUS
  Filled 2017-01-12: qty 1750

## 2017-01-12 NOTE — Progress Notes (Signed)
Pharmacy Antibiotic Note  Monica Terry is a 51 y.o. female admitted on 01/12/2017 with meningitis. Pharmacy has been consulted for ceftriaxone and vancomycin dosing. Patient also ordered ampicillin 2g IV Q4hr.   Plan: Ceftriaxone 2g IV Q12hr.   Vancomycin 1g IV x 1 in ED. Will start patient on vancomycin 1500mg  IV Q12hr for goal trough of 15-20. Will obtain trough prior to am dose on 1/20.    Height: 5\' 8"  (172.7 cm) Weight: 200 lb (90.7 kg) IBW/kg (Calculated) : 63.9  Temp (24hrs), Avg:97.5 F (36.4 C), Min:97.5 F (36.4 C), Max:97.5 F (36.4 C)   Recent Labs Lab 01/12/17 0527  WBC 26.4*  CREATININE 0.74    Estimated Creatinine Clearance: 98 mL/min (by C-G formula based on SCr of 0.74 mg/dL).    No Known Allergies  Antimicrobials this admission: Ampicillin 1/18 >>  Ceftriaxone 1/18 >>  Vancomycin 1/18 >>  Dose adjustments this admission: N/A  Microbiology results: 1/18 BCx: no growth < 12 hours  1/18 CSF Cx: many gram positive cocci in pairs   Pharmacy will continue to monitor and adjust per consult.   Alie Moudy L 01/12/2017 2:39 PM
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
## 2017-01-12 NOTE — ED Triage Notes (Signed)
Pt to rm 10 via EMS from home, report headache, n/v x 3 days, took percocet, 3 excedrine migraine, and alkaseltzer w/o relief, pt received 4 zofran enroute, VSS en route

## 2017-01-12 NOTE — ED Notes (Signed)
Dr. Norman at bedside.  

## 2017-01-12 NOTE — Progress Notes (Addendum)
PHARMACY - PHYSICIAN COMMUNICATION CRITICAL VALUE ALERT - BLOOD CULTURE IDENTIFICATION (BCID)  No results found for this or any previous visit.  01/12/17 2130 lab reports aerobic bottles of 2 sets (2 of 4 bottles so far) [facility] strep pneumo.  Name of physician (or Provider) Contacted: Dr. Ara Kussmaul  Changes to prescribed antibiotics required: None - continue CTX/vanc/amp  Laural Benes, Pharm.D., BCPS Clinical Pharmacist 01/12/2017  9:30 PM

## 2017-01-12 NOTE — ED Notes (Signed)
Pharmacy notified to verify medications 
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
## 2017-01-12 NOTE — H&P (Signed)
[facility] [place] NAME: Monica Terry    MR#:  [id number]  DATE OF BIRTH:  09/28/1966  DATE OF ADMISSION:  01/12/2017  PRIMARY CARE PHYSICIAN: Monica Market, MD   REQUESTING/REFERRING PHYSICIAN: dr Monica Terry  CHIEF COMPLAINT:   Headache HISTORY OF PRESENT ILLNESS:  Monica Terry  is a 51 y.o. female with a known history of  COPD and bipolar 1 disorder who presents with headache. Patient reports over the past several days she has had a severe headache, photophobia neck stiffness and fever. She reports no sinus infection or sick contacts.  Lumbar puncture performed in the emergency room shows evidence of bacterial meningitis. She was initially started on Rocephin, vancomycin and ampicillin.  PAST MEDICAL HISTORY:   Past Medical History:  Diagnosis Date  . Abnormal uterine bleeding   . Anemia   . Anxiety   . Arthritis    NECK  . Bipolar 1 disorder (Monica Terry)   . Brittle bones   . COPD (chronic obstructive pulmonary disease) ([place])   . Depression   . GERD (gastroesophageal reflux disease)   . Heart murmur   . Hepatitis 2016   C  . Hypercholesteremia   . Hypothyroidism   . Neck pain   . Neck pain, chronic   . Panic attack   . Sciatica   . Shortness of breath dyspnea   . Thyroid disease     PAST SURGICAL HISTORY:   Past Surgical History:  Procedure Laterality Date  . HYSTEROSCOPY W/D&C N/A 02/08/2016   Procedure: DILATATION AND CURETTAGE /HYSTEROSCOPY;  Surgeon: Monica Mars, MD;  Location: [facility];  Service: Gynecology;  Laterality: N/A;  . NECK SURGERY  2009   failed fusion  . SALPINGOOPHORECTOMY Bilateral 04/11/2016   Procedure: SALPINGO OOPHORECTOMY;  Surgeon: Monica Mars, MD;  Location: [facility];  Service: Gynecology;  Laterality: Bilateral;  . spleenectomy    . TUBAL LIGATION    . VAGINAL HYSTERECTOMY Bilateral 04/11/2016   Procedure: HYSTERECTOMY VAGINAL;  Surgeon: Monica Mars, MD;   Location: [facility];  Service: Gynecology;  Laterality: Bilateral;    SOCIAL HISTORY:   Social History  Substance Use Topics  . Smoking status: Former Smoker    Packs/day: 0.25    Years: 41.00    Types: Cigarettes  . Smokeless tobacco: Never Used  . Alcohol use No     Comment: occas    FAMILY HISTORY:   Family History  Problem Relation Age of Onset  . Diabetes Mother   . Heart disease Mother   . Cervical cancer Sister   . Diabetes Maternal Grandmother   . Colon cancer Maternal Grandfather   . Breast cancer Neg Hx   . Ovarian cancer Neg Hx     DRUG ALLERGIES:  No Known Allergies  REVIEW OF SYSTEMS:   Review of Systems  Constitutional: Positive for chills, fever and malaise/fatigue.  HENT: Negative for ear discharge, ear pain, hearing loss, nosebleeds and sore throat.   Eyes: Negative.  Negative for blurred vision and pain.  Respiratory: Negative.  Negative for cough, hemoptysis, shortness of breath and wheezing.   Cardiovascular: Negative.  Negative for chest pain, palpitations and leg swelling.  Gastrointestinal: Negative.  Negative for abdominal pain, blood in stool, diarrhea, nausea and vomiting.  Genitourinary: Negative.  Negative for dysuria.  Musculoskeletal: Positive for back pain.  Skin: Negative.   Neurological: Positive for weakness and headaches. Negative for dizziness, tremors, speech change, focal weakness and seizures.  Photophobia  Endo/Heme/Allergies: Negative.  Does not bruise/bleed easily.  Psychiatric/Behavioral: Negative.  Negative for depression, hallucinations and suicidal ideas.       Bipolar    MEDICATIONS AT HOME:   Prior to Admission medications   Medication Sig Start Date End Date Taking? Authorizing Provider  albuterol (ACCUNEB) 1.25 MG/3ML nebulizer solution Take 1 ampule by nebulization every 6 (six) hours as needed for wheezing.   Yes Historical Provider, MD  albuterol (PROAIR HFA) 108 (90 BASE) MCG/ACT inhaler Inhale 1-2 puffs  into the lungs every 6 (six) hours as needed.    Yes Historical Provider, MD  alendronate (FOSAMAX) 70 MG tablet Take 70 mg by mouth once a week. Take with a full glass of water on an empty stomach.   Yes Historical Provider, MD  aspirin 81 MG tablet Take 81 mg by mouth daily.   Yes Historical Provider, MD  cetirizine (ZYRTEC) 10 MG tablet Take 10 mg by mouth daily.    Yes Historical Provider, MD  diazepam (VALIUM) 5 MG tablet Take 1 tablet (5 mg total) by mouth every 8 (eight) hours as needed for anxiety. 06/20/15  Yes Monica Newport, MD  docusate sodium (COLACE) 100 MG capsule TAKE 1 CAPSULE BY MOUTH TWICE DAILY 05/03/16  Yes Monica Slim Defrancesco, MD  estradiol (ESTRACE) 1 MG tablet Take 1 tablet (1 mg total) by mouth daily. 05/25/16  Yes Monica Slim Defrancesco, MD  Fluticasone-Salmeterol (ADVAIR DISKUS) 250-50 MCG/DOSE AEPB Inhale 1 puff into the lungs 2 (two) times daily.   Yes Historical Provider, MD  gabapentin (NEURONTIN) 100 MG capsule Take 100 mg by mouth as needed.    Yes Historical Provider, MD  levothyroxine (SYNTHROID, LEVOTHROID) 150 MCG tablet Take 150 mcg by mouth daily before breakfast.   Yes Historical Provider, MD  ondansetron (ZOFRAN) 4 MG tablet Take 1 tablet (4 mg total) by mouth every 8 (eight) hours as needed for nausea or vomiting. 01/02/17  Yes Monica Dike, FNP  oxyCODONE-acetaminophen (PERCOCET/ROXICET) 5-325 MG tablet Take 1-2 tablets by mouth every 4 (four) hours as needed (moderate to severe pain (when tolerating fluids)). 04/20/16  Yes Monica Slim Defrancesco, MD  pregabalin (LYRICA) 100 MG capsule Take 100 mg by mouth daily.    Yes Historical Provider, MD  ranitidine (ZANTAC) 150 MG capsule Take 150 mg by mouth as needed for heartburn.   Yes Historical Provider, MD  simvastatin (ZOCOR) 20 MG tablet Take 20 mg by mouth every morning.   Yes Historical Provider, MD  tiotropium (SPIRIVA HANDIHALER) 18 MCG inhalation capsule Place 18 mcg into inhaler and inhale every morning.    Yes Historical Provider, MD  tiZANidine (ZANAFLEX) 4 MG tablet Take 4 mg by mouth every 6 (six) hours as needed.  10/08/14  Yes Historical Provider, MD      VITAL SIGNS:  Blood pressure 123/74, pulse 87, temperature 97.5 F (36.4 C), temperature source Oral, resp. rate 16, height 5\' 8"  (1.727 m), weight 90.7 kg (200 lb), last menstrual period 01/30/2016, SpO2 92 %.  PHYSICAL EXAMINATION:   Physical Exam  Constitutional: She is oriented to person, place, and time. She appears distressed.  Complaining of back pain from lumbar puncture  HENT:  Head: Normocephalic.  Eyes: No scleral icterus.  Neck: Neck supple. No JVD present. No tracheal deviation present.  Nuchal rigidity  Cardiovascular: Normal rate, regular rhythm and normal heart sounds.  Exam reveals no gallop and no friction rub.   No murmur heard. Pulmonary/Chest: Effort normal and breath sounds  normal. No respiratory distress. She has no wheezes. She has no rales. She exhibits no tenderness.  Abdominal: Soft. Bowel sounds are normal. She exhibits no distension and no mass. There is no tenderness. There is no rebound and no guarding.  Musculoskeletal: Normal range of motion. She exhibits no edema.  Neurological: She is alert and oriented to person, place, and time.  Skin: Skin is warm. No rash noted. No erythema.  Psychiatric: Affect and judgment normal.      LABORATORY PANEL:   CBC  Recent Labs Lab 01/12/17 0527  WBC 26.4*  HGB 13.4  HCT 39.3  PLT 345   ------------------------------------------------------------------------------------------------------------------  Chemistries   Recent Labs Lab 01/12/17 0527  NA 139  K 3.1*  CL 104  CO2 25  GLUCOSE 225*  BUN 13  CREATININE 0.74  CALCIUM 8.7*   ------------------------------------------------------------------------------------------------------------------  Cardiac Enzymes No results for input(s): TROPONINI in the last 168  hours. ------------------------------------------------------------------------------------------------------------------  RADIOLOGY:  Dg Chest 2 View  Result Date: 01/12/2017 CLINICAL DATA:  Fever, headache. EXAM: CHEST  2 VIEW COMPARISON:  11/24/2016 FINDINGS: Unchanged heart size and mediastinal contours. There is peribronchial thickening. Streaky bibasilar opacities favor atelectasis. No pleural fluid or pneumothorax. No acute osseous abnormality. IMPRESSION: Peribronchial thickening, may reflect acute bronchitis. Streaky bibasilar atelectasis. Electronically Signed   By: Jeb Levering M.D.   On: 01/12/2017 06:25   Ct Head Wo Contrast  Result Date: 01/12/2017 CLINICAL DATA:  Headache nausea and vomiting for 3 days. EXAM: CT HEAD WITHOUT CONTRAST TECHNIQUE: Contiguous axial images were obtained from the base of the skull through the vertex without intravenous contrast. COMPARISON:  None. FINDINGS: Brain: No evidence of acute infarction, hemorrhage, hydrocephalus, extra-axial collection or mass lesion/mass effect. Mild chronic small vessel ischemia. Vascular: No hyperdense vessel or unexpected calcification. Skull: Normal. Negative for fracture or focal lesion. Sinuses/Orbits: Paranasal sinuses and mastoid air cells are clear. The visualized orbits are unremarkable. Other: None. IMPRESSION: No acute intracranial abnormality. Mild chronic small vessel ischemia. Electronically Signed   By: Jeb Levering M.D.   On: 01/12/2017 06:12    EKG:  none  IMPRESSION AND PLAN:   51 year old female with a history of COPD who presents with neck stiffness, fever, photophobia and found to have bacterial meningitis.  1. Bacterial meningitis: Continue vancomycin and Rocephin Continue droplet precautions Follow-up on final culture results CSF Neurology consult Supportive therapy  2. Hypokalemia: Replete and recheck in a.m.  3. Hypothyroidism on Synthroid  4. COPD without exacerbation: Continue  inhalers  5. History of bipolar but not in any medications [facility] psychiatry to evaluate patient       All the records are reviewed and case discussed with ED provider. Management plans discussed with the patient and she in agreement  CODE STATUS: full  TOTAL TIME TAKING CARE OF THIS PATIENT: 45 minutes.    Sadao Weyer M.D on 01/12/2017 at 8:13 AM  Between 7am to 6pm - Pager - [phone]  After 6pm go to [email or web address] - password [email or web address] [facility]  Office  [phone]  CC: Primary care physician; Monica Market, MD

## 2017-01-12 NOTE — ED Notes (Signed)
Dr. Mariea Clonts performing lumbar puncture

## 2017-01-13 DIAGNOSIS — F3170 Bipolar disorder, currently in remission, most recent episode unspecified: Secondary | ICD-10-CM

## 2017-01-13 DIAGNOSIS — G039 Meningitis, unspecified: Secondary | ICD-10-CM

## 2017-01-13 LAB — URINALYSIS, COMPLETE (UACMP) WITH MICROSCOPIC
BACTERIA UA: NONE SEEN
BILIRUBIN URINE: NEGATIVE
KETONES UR: 5 mg/dL — AB
Leukocytes, UA: NEGATIVE
NITRITE: NEGATIVE
PROTEIN: 100 mg/dL — AB
Specific Gravity, Urine: 1.03 (ref 1.005–1.030)
pH: 6 (ref 5.0–8.0)

## 2017-01-13 LAB — RAPID HIV SCREEN (HIV 1/2 AB+AG)
HIV 1/2 Antibodies: NONREACTIVE
HIV-1 P24 ANTIGEN - HIV24: NONREACTIVE

## 2017-01-13 LAB — CBC
HCT: 33.8 % — ABNORMAL LOW (ref 35.0–47.0)
Hemoglobin: 11.2 g/dL — ABNORMAL LOW (ref 12.0–16.0)
MCH: 30.3 pg (ref 26.0–34.0)
MCHC: 33.1 g/dL (ref 32.0–36.0)
MCV: 91.6 fL (ref 80.0–100.0)
PLATELETS: 301 10*3/uL (ref 150–440)
RBC: 3.68 MIL/uL — AB (ref 3.80–5.20)
RDW: 14.5 % (ref 11.5–14.5)
WBC: 31.5 10*3/uL — ABNORMAL HIGH (ref 3.6–11.0)

## 2017-01-13 LAB — HERPES SIMPLEX VIRUS(HSV) DNA BY PCR
HSV 1 DNA: NEGATIVE
HSV 2 DNA: NEGATIVE

## 2017-01-13 MED ORDER — DEXAMETHASONE SODIUM PHOSPHATE 4 MG/ML IJ SOLN
16.0000 mg | Freq: Four times a day (QID) | INTRAMUSCULAR | Status: DC
  Administered 2017-01-13 – 2017-01-15 (×7): 16 mg via INTRAVENOUS
  Filled 2017-01-13 (×7): qty 4

## 2017-01-13 MED ORDER — METOCLOPRAMIDE HCL 5 MG/ML IJ SOLN
10.0000 mg | Freq: Four times a day (QID) | INTRAMUSCULAR | Status: DC | PRN
  Administered 2017-01-13 (×2): 10 mg via INTRAVENOUS
  Filled 2017-01-13 (×2): qty 2

## 2017-01-13 NOTE — Consult Note (Signed)
Reason for Consult:headache  Referring Physician:Dr. Sudini   CC: headache   HPI: Monica Terry is an 51 y.o. female with a known history of  COPD and bipolar 1 disorder who presents with headache. Pt does have neck stiffness.   LP shows 1231 WBC neurophil predominant with low glucose.  Symptoms consistent with bacterial meningitis.    Past Medical History:  Diagnosis Date  . Abnormal uterine bleeding   . Anemia   . Anxiety   . Arthritis    NECK  . Bipolar 1 disorder ([place])   . Brittle bones   . COPD (chronic obstructive pulmonary disease) ([place])   . Depression   . GERD (gastroesophageal reflux disease)   . Heart murmur   . Hepatitis 2016   C  . Hypercholesteremia   . Hypothyroidism   . Neck pain   . Neck pain, chronic   . Panic attack   . Sciatica   . Shortness of breath dyspnea   . Thyroid disease     Past Surgical History:  Procedure Laterality Date  . HYSTEROSCOPY W/D&C N/A 02/08/2016   Procedure: DILATATION AND CURETTAGE /HYSTEROSCOPY;  Surgeon: Brayton Mars, MD;  Location: [facility];  Service: Gynecology;  Laterality: N/A;  . NECK SURGERY  2009   failed fusion  . SALPINGOOPHORECTOMY Bilateral 04/11/2016   Procedure: SALPINGO OOPHORECTOMY;  Surgeon: Brayton Mars, MD;  Location: [facility];  Service: Gynecology;  Laterality: Bilateral;  . spleenectomy    . TUBAL LIGATION    . VAGINAL HYSTERECTOMY Bilateral 04/11/2016   Procedure: HYSTERECTOMY VAGINAL;  Surgeon: Brayton Mars, MD;  Location: [facility];  Service: Gynecology;  Laterality: Bilateral;    Family History  Problem Relation Age of Onset  . Diabetes Mother   . Heart disease Mother   . Cervical cancer Sister   . Diabetes Maternal Grandmother   . Colon cancer Maternal Grandfather   . Breast cancer Neg Hx   . Ovarian cancer Neg Hx     Social History:  reports that she has quit smoking. Her smoking use included Cigarettes. She has a 10.25 pack-year smoking history. She has never used  smokeless tobacco. She reports that she does not drink alcohol or use drugs.  No Known Allergies  Medications: I have reviewed the patient's current medications.  ROS: History obtained from the patient  General ROS: negative for - chills, fatigue, fever, night sweats, weight gain or weight loss Psychological ROS: negative for - behavioral disorder, hallucinations, memory difficulties, mood swings or suicidal ideation Ophthalmic ROS: negative for - blurry vision, double vision, eye pain or loss of vision ENT ROS: negative for - epistaxis, nasal discharge, oral lesions, sore throat, tinnitus or vertigo Allergy and Immunology ROS: negative for - hives or itchy/watery eyes Hematological and Lymphatic ROS: negative for - bleeding problems, bruising or swollen lymph nodes Endocrine ROS: negative for - galactorrhea, hair pattern changes, polydipsia/polyuria or temperature intolerance Respiratory ROS: negative for - cough, hemoptysis, shortness of breath or wheezing Cardiovascular ROS: negative for - chest pain, dyspnea on exertion, edema or irregular heartbeat Gastrointestinal ROS: negative for - abdominal pain, diarrhea, hematemesis, nausea/vomiting or stool incontinence Genito-Urinary ROS: negative for - dysuria, hematuria, incontinence or urinary frequency/urgency Musculoskeletal ROS: negative for - joint swelling or muscular weakness Neurological ROS: as noted in HPI Dermatological ROS: negative for rash and skin lesion changes  Physical Examination: Blood pressure (!) 109/57, pulse 88, temperature 98.5 F (36.9 C), temperature source Oral, resp. rate 20, height 5\' 8"  (1.727  m), weight 104.7 kg (230 lb 12.8 oz), last menstrual period 01/30/2016, SpO2 95 %.  Neurological Examination Mental Status: Alert, oriented, thought content appropriate.  Speech fluent without evidence of aphasia.  Able to follow 3 step commands without difficulty. Cranial Nerves: II: Discs flat bilaterally; Visual  fields grossly normal, pupils equal, round, reactive to light and accommodation III,IV, VI: ptosis not present, extra-ocular motions intact bilaterally V,VII: smile symmetric, facial light touch sensation normal bilaterally VIII: hearing normal bilaterally IX,X: gag reflex present XI: bilateral shoulder shrug XII: midline tongue extension Motor: Right : Upper extremity   5/5    Left:     Upper extremity   5/5  Lower extremity   5/5     Lower extremity   5/5 Tone and bulk:normal tone throughout; no atrophy noted Sensory: Pinprick and light touch intact throughout, bilaterally Deep Tendon Reflexes: 2+ and symmetric throughout Plantars: Right: downgoing   Left: downgoing Cerebellar: normal finger-to-nose, normal rapid alternating movements and normal heel-to-shin test Gait: not tested      Laboratory Studies:   Basic Metabolic Panel:  Recent Labs Lab 01/12/17 0527  NA 139  K 3.1*  CL 104  CO2 25  GLUCOSE 225*  BUN 13  CREATININE 0.74  CALCIUM 8.7*    Liver Function Tests: No results for input(s): AST, ALT, ALKPHOS, BILITOT, PROT, ALBUMIN in the last 168 hours. No results for input(s): LIPASE, AMYLASE in the last 168 hours. No results for input(s): AMMONIA in the last 168 hours.  CBC:  Recent Labs Lab 01/12/17 0527 01/13/17 0414  WBC 26.4* 31.5*  HGB 13.4 11.2*  HCT 39.3 33.8*  MCV 89.5 91.6  PLT 345 301    Cardiac Enzymes: No results for input(s): CKTOTAL, CKMB, CKMBINDEX, TROPONINI in the last 168 hours.  BNP: Invalid input(s): POCBNP  CBG: No results for input(s): GLUCAP in the last 168 hours.  Microbiology: Results for orders placed or performed during the hospital encounter of 01/12/17  CSF culture     Status: None (Preliminary result)   Collection Time: 01/12/17  6:31 AM  Result Value Ref Range Status   Specimen Description CSF  Final   Special Requests Normal  Final   Gram Stain   Final     MANY GRAM POSITIVE COCCI IN PAIRS  MANY  WBC CRITICAL RESULT CALLED TO, READ BACK BY AND VERIFIED WITH:  Lillia Mountain AT [id number] 01/12/17 SDR    Culture   Final    CULTURE REINCUBATED FOR BETTER GROWTH Performed at [facility], [place] [address] [place], [place] [address]    Report Status PENDING  Incomplete  Culture, blood (routine x 2)     Status: None (Preliminary result)   Collection Time: 01/12/17  6:54 AM  Result Value Ref Range Status   Specimen Description BLOOD R AC  Final   Special Requests BOTTLES DRAWN AEROBIC AND ANAEROBIC 4ML  Final   Culture  Setup Time   Final    Organism ID to follow GRAM POSITIVE COCCI IN BOTH AEROBIC AND ANAEROBIC BOTTLES CRITICAL RESULT CALLED TO, READ BACK BY AND VERIFIED WITH: NATE COOKSON AT 2125 ON 01/12/2017 JLJ    Culture GRAM POSITIVE COCCI  Final   Report Status PENDING  Incomplete  Culture, blood (routine x 2)     Status: None (Preliminary result)   Collection Time: 01/12/17  6:54 AM  Result Value Ref Range Status   Specimen Description BLOOD L AC  Final   Special Requests BOTTLES DRAWN  AEROBIC AND ANAEROBIC 10ML  Final   Culture  Setup Time   Final    GRAM POSITIVE COCCI IN BOTH AEROBIC AND ANAEROBIC BOTTLES CRITICAL VALUE NOTED.  VALUE IS CONSISTENT WITH PREVIOUSLY REPORTED AND CALLED VALUE.    Culture GRAM POSITIVE COCCI  Final   Report Status PENDING  Incomplete  Blood Culture ID Panel (Reflexed)     Status: Abnormal   Collection Time: 01/12/17  6:54 AM  Result Value Ref Range Status   Enterococcus species NOT DETECTED NOT DETECTED Final   Listeria monocytogenes NOT DETECTED NOT DETECTED Final   Staphylococcus species NOT DETECTED NOT DETECTED Final   Staphylococcus aureus NOT DETECTED NOT DETECTED Final   Streptococcus species DETECTED (A) NOT DETECTED Final    Comment: CRITICAL RESULT CALLED TO, READ BACK BY AND VERIFIED WITH: NATE COOKSON AT 2125 ON 01/12/2017 JLJ    Streptococcus agalactiae NOT DETECTED NOT DETECTED Final   Streptococcus pneumoniae DETECTED  (A) NOT DETECTED Final    Comment: CRITICAL RESULT CALLED TO, READ BACK BY AND VERIFIED WITH: NATE COOKSON AT 2125 ON 01/12/2017 JLJ    Streptococcus pyogenes NOT DETECTED NOT DETECTED Final   Acinetobacter baumannii NOT DETECTED NOT DETECTED Final   Enterobacteriaceae species NOT DETECTED NOT DETECTED Final   Enterobacter cloacae complex NOT DETECTED NOT DETECTED Final   Escherichia coli NOT DETECTED NOT DETECTED Final   Klebsiella oxytoca NOT DETECTED NOT DETECTED Final   Klebsiella pneumoniae NOT DETECTED NOT DETECTED Final   Proteus species NOT DETECTED NOT DETECTED Final   Serratia marcescens NOT DETECTED NOT DETECTED Final   Haemophilus influenzae NOT DETECTED NOT DETECTED Final   Neisseria meningitidis NOT DETECTED NOT DETECTED Final   Pseudomonas aeruginosa NOT DETECTED NOT DETECTED Final   Candida albicans NOT DETECTED NOT DETECTED Final   Candida glabrata NOT DETECTED NOT DETECTED Final   Candida krusei NOT DETECTED NOT DETECTED Final   Candida parapsilosis NOT DETECTED NOT DETECTED Final   Candida tropicalis NOT DETECTED NOT DETECTED Final    Coagulation Studies: No results for input(s): LABPROT, INR in the last 72 hours.  Urinalysis:  Recent Labs Lab 01/12/17 0602  COLORURINE YELLOW*  LABSPEC 1.030  PHURINE 6.0  GLUCOSEU >=500*  HGBUR SMALL*  BILIRUBINUR NEGATIVE  KETONESUR 5*  PROTEINUR 100*  NITRITE NEGATIVE  LEUKOCYTESUR NEGATIVE    Lipid Panel:  No results found for: CHOL, TRIG, HDL, CHOLHDL, VLDL, LDLCALC  HgbA1C: No results found for: HGBA1C  Urine Drug Screen:  No results found for: LABOPIA, COCAINSCRNUR, LABBENZ, AMPHETMU, THCU, LABBARB  Alcohol Level: No results for input(s): ETH in the last 168 hours.  Other results: EKG: normal EKG, normal sinus rhythm, unchanged from previous tracings.  Imaging: Dg Chest 2 View  Result Date: 01/12/2017 CLINICAL DATA:  Fever, headache. EXAM: CHEST  2 VIEW COMPARISON:  11/24/2016 FINDINGS: Unchanged heart  size and mediastinal contours. There is peribronchial thickening. Streaky bibasilar opacities favor atelectasis. No pleural fluid or pneumothorax. No acute osseous abnormality. IMPRESSION: Peribronchial thickening, may reflect acute bronchitis. Streaky bibasilar atelectasis. Electronically Signed   By: Jeb Levering M.D.   On: 01/12/2017 06:25   Ct Head Wo Contrast  Result Date: 01/12/2017 CLINICAL DATA:  Headache nausea and vomiting for 3 days. EXAM: CT HEAD WITHOUT CONTRAST TECHNIQUE: Contiguous axial images were obtained from the base of the skull through the vertex without intravenous contrast. COMPARISON:  None. FINDINGS: Brain: No evidence of acute infarction, hemorrhage, hydrocephalus, extra-axial collection or mass lesion/mass effect. Mild chronic small vessel  ischemia. Vascular: No hyperdense vessel or unexpected calcification. Skull: Normal. Negative for fracture or focal lesion. Sinuses/Orbits: Paranasal sinuses and mastoid air cells are clear. The visualized orbits are unremarkable. Other: None. IMPRESSION: No acute intracranial abnormality. Mild chronic small vessel ischemia. Electronically Signed   By: Jeb Levering M.D.   On: 01/12/2017 06:12     Assessment/Plan:  51 y.o. female with a known history of  COPD and bipolar 1 disorder who presents with headache. Pt does have neck stiffness.   LP shows 1231 WBC neurophil predominant with low glucose.  Symptoms consistent with bacterial meningitis.    Symptoms and testing consistent with bacterial meningitis Continue antibiotics Cultures pending Would appreciate ID input.  Leotis Pain   01/13/2017, 12:53 PM

## 2017-01-13 NOTE — Plan of Care (Signed)
Problem: Education: Goal: Knowledge of [facility] Education information/materials will improve Outcome: Progressing Pt reports continued HA/neck pain, nausea.  Received PRN's throughout shift, also received one-time dose PO Reglan 10mg .  Bed in low position, call bell within reach.  WCTM.
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
## 2017-01-13 NOTE — Progress Notes (Signed)
[facility] at [place] NAME: Monica Terry    MR#:  [id number]  DATE OF BIRTH:  October 11, 1966  SUBJECTIVE:  CHIEF COMPLAINT:   Chief Complaint  Patient presents with  . Headache   Continues to have headache, photophobia and neck stiffness  REVIEW OF SYSTEMS:    Review of Systems  Constitutional: Positive for malaise/fatigue. Negative for chills and fever.  HENT: Negative for sore throat.   Eyes: Positive for photophobia. Negative for blurred vision, double vision and pain.  Respiratory: Negative for cough, hemoptysis, shortness of breath and wheezing.   Cardiovascular: Negative for chest pain, palpitations, orthopnea and leg swelling.  Gastrointestinal: Negative for abdominal pain, constipation, diarrhea, heartburn, nausea and vomiting.  Genitourinary: Negative for dysuria and hematuria.  Musculoskeletal: Negative for back pain and joint pain.  Skin: Negative for rash.  Neurological: Positive for weakness and headaches. Negative for sensory change, speech change and focal weakness.  Endo/Heme/Allergies: Does not bruise/bleed easily.  Psychiatric/Behavioral: Negative for depression. The patient is not nervous/anxious.     DRUG ALLERGIES:  No Known Allergies  VITALS:  Blood pressure (!) 109/57, pulse 88, temperature 98.5 F (36.9 C), temperature source Oral, resp. rate 20, height 5\' 8"  (1.727 m), weight 104.7 kg (230 lb 12.8 oz), last menstrual period 01/30/2016, SpO2 95 %.  PHYSICAL EXAMINATION:   Physical Exam  GENERAL:  51 y.o.-year-old patient lying in the bed. Anxious and agitated due to her headache EYES: Pupils equal, round, reactive to light and accommodation. No scleral icterus. Extraocular muscles intact.  HEENT: Head atraumatic, normocephalic. Oropharynx and nasopharynx clear.  NECK:  Stiffness positive, no jugular venous distention. No thyroid enlargement, no tenderness.  LUNGS: Normal breath sounds bilaterally, no  wheezing, rales, rhonchi. No use of accessory muscles of respiration.  CARDIOVASCULAR: S1, S2 normal. No murmurs, rubs, or gallops.  ABDOMEN: Soft, nontender, nondistended. Bowel sounds present. No organomegaly or mass.  EXTREMITIES: No cyanosis, clubbing or edema b/l.    NEUROLOGIC: Cranial nerves II through XII are intact. No focal Motor or sensory deficits b/l.   PSYCHIATRIC: The patient is anxious SKIN: No obvious rash, lesion, or ulcer.   LABORATORY PANEL:   CBC  Recent Labs Lab 01/13/17 0414  WBC 31.5*  HGB 11.2*  HCT 33.8*  PLT 301   ------------------------------------------------------------------------------------------------------------------ Chemistries   Recent Labs Lab 01/12/17 0527  NA 139  K 3.1*  CL 104  CO2 25  GLUCOSE 225*  BUN 13  CREATININE 0.74  CALCIUM 8.7*   ------------------------------------------------------------------------------------------------------------------  Cardiac Enzymes No results for input(s): TROPONINI in the last 168 hours. ------------------------------------------------------------------------------------------------------------------  RADIOLOGY:  Dg Chest 2 View  Result Date: 01/12/2017 CLINICAL DATA:  Fever, headache. EXAM: CHEST  2 VIEW COMPARISON:  11/24/2016 FINDINGS: Unchanged heart size and mediastinal contours. There is peribronchial thickening. Streaky bibasilar opacities favor atelectasis. No pleural fluid or pneumothorax. No acute osseous abnormality. IMPRESSION: Peribronchial thickening, may reflect acute bronchitis. Streaky bibasilar atelectasis. Electronically Signed   By: Jeb Levering M.D.   On: 01/12/2017 06:25   Ct Head Wo Contrast  Result Date: 01/12/2017 CLINICAL DATA:  Headache nausea and vomiting for 3 days. EXAM: CT HEAD WITHOUT CONTRAST TECHNIQUE: Contiguous axial images were obtained from the base of the skull through the vertex without intravenous contrast. COMPARISON:  None. FINDINGS: Brain:  No evidence of acute infarction, hemorrhage, hydrocephalus, extra-axial collection or mass lesion/mass effect. Mild chronic small vessel ischemia. Vascular: No hyperdense vessel or unexpected calcification. Skull: Normal. Negative for fracture  or focal lesion. Sinuses/Orbits: Paranasal sinuses and mastoid air cells are clear. The visualized orbits are unremarkable. Other: None. IMPRESSION: No acute intracranial abnormality. Mild chronic small vessel ischemia. Electronically Signed   By: Jeb Levering M.D.   On: 01/12/2017 06:12     ASSESSMENT AND PLAN:   * Meningitis, most likely due to strep pneumonia. Strep pneumonia bacteremia Gram-positive pairs and CSF cultures. And blood cultures with strep pneumonia. Discontinue ampicillin. Continue ceftriaxone and vancomycin. Wait for final culture and sensitivities. Consult infectious disease. Discontinue isolation  * Sepsis present on admission. Improved.  * COPD stable  * Bipolar disorder   All the records are reviewed and case discussed with Care Management/Social Workerr. Management plans discussed with the patient, family and they are in agreement.  CODE STATUS: FULL CODE  DVT Prophylaxis: SCDs  TOTAL TIME TAKING CARE OF THIS PATIENT: 35 minutes.   POSSIBLE D/C IN 2-3 DAYS, DEPENDING ON CLINICAL CONDITION.  Hillary Bow R M.D on 01/13/2017 at 1:27 PM  Between 7am to 6pm - Pager - [phone]  After 6pm go to [email or web address] - password [email or web address] [facility]  Office  [phone]  CC: Primary care physician; Lorelee Market, MD  Note: This dictation was prepared with Dragon dictation along with smaller phrase technology. Any transcriptional errors that result from this process are unintentional.

## 2017-01-13 NOTE — Consult Note (Signed)
[facility] Infectious Disease     Reason for Consult: Meningitis    Referring Physician: Bettey Costa Date of Admission:  01/12/2017   Active Problems:   Meningitis   HPI: Monica Terry is a 51 y.o. female admitted with several days HA, neck pain, photophobia and fever.  She had Lp and received ctx, vanco and ampicillin, BCX + Strep PNA.  CUrrently remains with bad neck pain and HA> seems to have defervesced   Past Medical History:  Diagnosis Date  . Abnormal uterine bleeding   . Anemia   . Anxiety   . Arthritis    NECK  . Bipolar 1 disorder ([place])   . Brittle bones   . COPD (chronic obstructive pulmonary disease) ([place])   . Depression   . GERD (gastroesophageal reflux disease)   . Heart murmur   . Hepatitis 2016   C  . Hypercholesteremia   . Hypothyroidism   . Neck pain   . Neck pain, chronic   . Panic attack   . Sciatica   . Shortness of breath dyspnea   . Thyroid disease    Past Surgical History:  Procedure Laterality Date  . HYSTEROSCOPY W/D&C N/A 02/08/2016   Procedure: DILATATION AND CURETTAGE /HYSTEROSCOPY;  Surgeon: Brayton Mars, MD;  Location: [facility] ORS;  Service: Gynecology;  Laterality: N/A;  . NECK SURGERY  2009   failed fusion  . SALPINGOOPHORECTOMY Bilateral 04/11/2016   Procedure: SALPINGO OOPHORECTOMY;  Surgeon: Brayton Mars, MD;  Location: [facility] ORS;  Service: Gynecology;  Laterality: Bilateral;  . spleenectomy    . TUBAL LIGATION    . VAGINAL HYSTERECTOMY Bilateral 04/11/2016   Procedure: HYSTERECTOMY VAGINAL;  Surgeon: Brayton Mars, MD;  Location: [facility] ORS;  Service: Gynecology;  Laterality: Bilateral;   Social History  Substance Use Topics  . Smoking status: Former Smoker    Packs/day: 0.25    Years: 41.00    Types: Cigarettes  . Smokeless tobacco: Never Used  . Alcohol use No     Comment: occas   Family History  Problem Relation Age of Onset  . Diabetes Mother   . Heart disease Mother   . Cervical cancer Sister    . Diabetes Maternal Grandmother   . Colon cancer Maternal Grandfather   . Breast cancer Neg Hx   . Ovarian cancer Neg Hx     Allergies: No Known Allergies  Current antibiotics: Antibiotics Given (last 72 hours)    Date/Time Action Medication Dose Rate   01/12/17 0725 Given   cefTRIAXone (ROCEPHIN) IVPB 2 g 2 g 100 mL/hr   01/12/17 1707 Given   ampicillin (OMNIPEN) 2 g in sodium chloride 0.9 % 50 mL IVPB 2 g 150 mL/hr   01/12/17 1739 Given  [Medication not available at scheduled time]   vancomycin (VANCOCIN) 1,500 mg in sodium chloride 0.9 % 500 mL IVPB 1,500 mg 250 mL/hr   01/12/17 2054 Given   ampicillin (OMNIPEN) 2 g in sodium chloride 0.9 % 50 mL IVPB 2 g 150 mL/hr   01/12/17 2123 Given  [med not available from pharmacy at scheduled time]   cefTRIAXone (ROCEPHIN) IVPB 2 g 2 g 100 mL/hr   01/13/17 0134 Given   ampicillin (OMNIPEN) 2 g in sodium chloride 0.9 % 50 mL IVPB 2 g 150 mL/hr   01/13/17 0344 Given   vancomycin (VANCOCIN) 1,500 mg in sodium chloride 0.9 % 500 mL IVPB 1,500 mg 250 mL/hr   01/13/17 0630 Given   ampicillin (OMNIPEN) 2  g in sodium chloride 0.9 % 50 mL IVPB 2 g 150 mL/hr   01/13/17 1121 Given   cefTRIAXone (ROCEPHIN) IVPB 2 g 2 g 100 mL/hr      MEDICATIONS: . aspirin EC  81 mg Oral Daily  . cefTRIAXone  2 g Intravenous Q12H  . docusate sodium  100 mg Oral BID  . enoxaparin (LOVENOX) injection  40 mg Subcutaneous Q24H  . estradiol  1 mg Oral Daily  . levothyroxine  150 mcg Oral QAC breakfast  . loratadine  10 mg Oral Daily  . mometasone-formoterol  2 puff Inhalation BID  . pregabalin  100 mg Oral Daily  . simvastatin  20 mg Oral Daily  . tiotropium  18 mcg Inhalation BH-q7a  . vancomycin  1,500 mg Intravenous Q12H    Review of Systems - 11 systems reviewed and negative per HPI   OBJECTIVE: Temp:  [97.8 F (36.6 C)-98.5 F (36.9 C)] 98.5 F (36.9 C) (01/19 0451) Pulse Rate:  [80-96] 88 (01/19 0451) Resp:  [16-20] 20 (01/19 0451) BP:  (109-145)/(57-88) 109/57 (01/19 0451) SpO2:  [91 %-98 %] 95 % (01/19 0451) Weight:  [104.7 kg (230 lb 12.8 oz)] 104.7 kg (230 lb 12.8 oz) (01/18 1607) Physical Exam  Constitutional:  Awake, ill appearing, in pain  HENT: [place]/AT, PERRLA, no scleral icterus + photophobia Mouth/Throat: Oropharynx is clear and dry . No oropharyngeal exudate.  Cardiovascular: tachy reg Pulmonary/Chest: bil rhonchi Neck = +  nuchal rigidity Abdominal: Soft. Bowel sounds are normal.  exhibits no distension. There is no tenderness.  Lymphadenopathy: no cervical adenopathy. No axillary adenopathy Neurological: alert and oriented to person, place, and time. Non focal neuro exam  Skin: Skin is warm and dry. No rash noted. No erythema.  Psychiatric: a normal mood and affect.  behavior is normal.    LABS: Results for orders placed or performed during the hospital encounter of 01/12/17 (from the past 48 hour(s))  CBC     Status: Abnormal   Collection Time: 01/12/17  5:27 AM  Result Value Ref Range   WBC 26.4 (H) 3.6 - 11.0 K/uL   RBC 4.39 3.80 - 5.20 MIL/uL   Hemoglobin 13.4 12.0 - 16.0 g/dL   HCT 39.3 35.0 - 47.0 %   MCV 89.5 80.0 - 100.0 fL   MCH 30.5 26.0 - 34.0 pg   MCHC 34.0 32.0 - 36.0 g/dL   RDW 14.4 11.5 - 14.5 %   Platelets 345 150 - 440 K/uL  Basic metabolic panel     Status: Abnormal   Collection Time: 01/12/17  5:27 AM  Result Value Ref Range   Sodium 139 135 - 145 mmol/L   Potassium 3.1 (L) 3.5 - 5.1 mmol/L   Chloride 104 101 - 111 mmol/L   CO2 25 22 - 32 mmol/L   Glucose, Bld 225 (H) 65 - 99 mg/dL   BUN 13 6 - 20 mg/dL   Creatinine, Ser 0.74 0.44 - 1.00 mg/dL   Calcium 8.7 (L) 8.9 - 10.3 mg/dL   GFR calc non Af Amer >60 >60 mL/min   GFR calc Af Amer >60 >60 mL/min    Comment: (NOTE) The eGFR has been calculated using the CKD EPI equation. This calculation has not been validated in all clinical situations. eGFR's persistently <60 mL/min signify possible Chronic Kidney Disease.    Anion  gap 10 5 - 15  Influenza panel by PCR (type A & B)     Status: None   Collection Time:  01/12/17  5:43 AM  Result Value Ref Range   Influenza A By PCR NEGATIVE NEGATIVE   Influenza B By PCR NEGATIVE NEGATIVE    Comment: (NOTE) The Xpert Xpress Flu assay is intended as an aid in the diagnosis of  influenza and should not be used as a sole basis for treatment.  This  assay is FDA approved for nasopharyngeal swab specimens only. Nasal  washings and aspirates are unacceptable for Xpert Xpress Flu testing.   Urinalysis, Complete w Microscopic     Status: Abnormal   Collection Time: 01/12/17  6:02 AM  Result Value Ref Range   Color, Urine YELLOW (A) YELLOW   APPearance CLOUDY (A) CLEAR   Specific Gravity, Urine 1.030 1.005 - 1.030   pH 6.0 5.0 - 8.0   Glucose, UA >=500 (A) NEGATIVE mg/dL   Hgb urine dipstick SMALL (A) NEGATIVE   Bilirubin Urine NEGATIVE NEGATIVE   Ketones, ur 5 (A) NEGATIVE mg/dL   Protein, ur 100 (A) NEGATIVE mg/dL   Nitrite NEGATIVE NEGATIVE   Leukocytes, UA NEGATIVE NEGATIVE   RBC / HPF 6-30 0 - 5 RBC/hpf   WBC, UA 0-5 0 - 5 WBC/hpf   Bacteria, UA NONE SEEN NONE SEEN   Squamous Epithelial / LPF TOO NUMEROUS TO COUNT (A) NONE SEEN   Mucous PRESENT   CSF cell count with differential collection tube #: 1     Status: Abnormal   Collection Time: 01/12/17  6:31 AM  Result Value Ref Range   Tube # 1    Color, CSF CLEAR (A) COLORLESS   Appearance, CSF HAZY (A) CLEAR   Supernatant CLEAR    RBC Count, CSF 86 (H) 0 - 3 /cu mm   WBC, CSF 845 (HH) 0 - 5 /cu mm    Comment: CRITICAL RESULT CALLED TO, READ BACK BY AND VERIFIED WITH: ASHLEY SMITH ON 01/12/17 AT 0754 MNS    Segmented Neutrophils-CSF 87 %   Lymphs, CSF 13 %   Monocyte-Macrophage-Spinal Fluid 0 %   Eosinophils, CSF 0 %  CSF cell count with differential collection tube #: 4     Status: Abnormal   Collection Time: 01/12/17  6:31 AM  Result Value Ref Range   Tube # 4    Color, CSF CLEAR (A) COLORLESS    Appearance, CSF HAZY (A) CLEAR   Supernatant CLEAR    RBC Count, CSF 161 (H) 0 - 3 /cu mm   WBC, CSF 1,231 (HH) 0 - 5 /cu mm    Comment: CRITICAL RESULT CALLED TO, READ BACK BY AND VERIFIED WITH: ASHLEY SMITH ON 01/12/17 AT 0754 MNS    Segmented Neutrophils-CSF 86 %   Lymphs, CSF 14 %   Monocyte-Macrophage-Spinal Fluid 0 %   Eosinophils, CSF 0 %  CSF culture     Status: None (Preliminary result)   Collection Time: 01/12/17  6:31 AM  Result Value Ref Range   Specimen Description CSF    Special Requests Normal    Gram Stain       MANY GRAM POSITIVE COCCI IN PAIRS  MANY WBC CRITICAL RESULT CALLED TO, READ BACK BY AND VERIFIED WITH:  Lillia Mountain AT 2778 01/12/17 SDR    Culture PENDING    Report Status PENDING   Protein and glucose, CSF     Status: Abnormal   Collection Time: 01/12/17  6:31 AM  Result Value Ref Range   Glucose, CSF <20 (LL) 40 - 70 mg/dL   Total  Protein, CSF  300 (H) 15 - 45 mg/dL    Comment: RESULT CONFIRMED BY MANUAL DILUTION DAS  Pathologist smear review     Status: None   Collection Time: 01/12/17  6:31 AM  Result Value Ref Range   Path Review      CSF cytospin review shows a cellular specimen consisting of 86/uL neutrophils. The findings are consistent with meningitis if correlated clinically. Dr. Luana Shu.  Culture, blood (routine x 2)     Status: None (Preliminary result)   Collection Time: 01/12/17  6:54 AM  Result Value Ref Range   Specimen Description BLOOD R AC    Special Requests BOTTLES DRAWN AEROBIC AND ANAEROBIC 4ML    Culture  Setup Time      Organism ID to follow GRAM POSITIVE COCCI IN BOTH AEROBIC AND ANAEROBIC BOTTLES CRITICAL RESULT CALLED TO, READ BACK BY AND VERIFIED WITH: NATE COOKSON AT 2125 ON 01/12/2017 JLJ    Culture GRAM POSITIVE COCCI    Report Status PENDING   Culture, blood (routine x 2)     Status: None (Preliminary result)   Collection Time: 01/12/17  6:54 AM  Result Value Ref Range   Specimen Description BLOOD L AC    Special  Requests BOTTLES DRAWN AEROBIC AND ANAEROBIC 10ML    Culture  Setup Time      GRAM POSITIVE COCCI IN BOTH AEROBIC AND ANAEROBIC BOTTLES CRITICAL VALUE NOTED.  VALUE IS CONSISTENT WITH PREVIOUSLY REPORTED AND CALLED VALUE.    Culture GRAM POSITIVE COCCI    Report Status PENDING   Blood Culture ID Panel (Reflexed)     Status: Abnormal   Collection Time: 01/12/17  6:54 AM  Result Value Ref Range   Enterococcus species NOT DETECTED NOT DETECTED   Listeria monocytogenes NOT DETECTED NOT DETECTED   Staphylococcus species NOT DETECTED NOT DETECTED   Staphylococcus aureus NOT DETECTED NOT DETECTED   Streptococcus species DETECTED (A) NOT DETECTED    Comment: CRITICAL RESULT CALLED TO, READ BACK BY AND VERIFIED WITH: NATE COOKSON AT 2125 ON 01/12/2017 JLJ    Streptococcus agalactiae NOT DETECTED NOT DETECTED   Streptococcus pneumoniae DETECTED (A) NOT DETECTED    Comment: CRITICAL RESULT CALLED TO, READ BACK BY AND VERIFIED WITH: NATE COOKSON AT 2125 ON 01/12/2017 JLJ    Streptococcus pyogenes NOT DETECTED NOT DETECTED   Acinetobacter baumannii NOT DETECTED NOT DETECTED   Enterobacteriaceae species NOT DETECTED NOT DETECTED   Enterobacter cloacae complex NOT DETECTED NOT DETECTED   Escherichia coli NOT DETECTED NOT DETECTED   Klebsiella oxytoca NOT DETECTED NOT DETECTED   Klebsiella pneumoniae NOT DETECTED NOT DETECTED   Proteus species NOT DETECTED NOT DETECTED   Serratia marcescens NOT DETECTED NOT DETECTED   Haemophilus influenzae NOT DETECTED NOT DETECTED   Neisseria meningitidis NOT DETECTED NOT DETECTED   Pseudomonas aeruginosa NOT DETECTED NOT DETECTED   Candida albicans NOT DETECTED NOT DETECTED   Candida glabrata NOT DETECTED NOT DETECTED   Candida krusei NOT DETECTED NOT DETECTED   Candida parapsilosis NOT DETECTED NOT DETECTED   Candida tropicalis NOT DETECTED NOT DETECTED  CBC     Status: Abnormal   Collection Time: 01/13/17  4:14 AM  Result Value Ref Range   WBC 31.5 (H)  3.6 - 11.0 K/uL   RBC 3.68 (L) 3.80 - 5.20 MIL/uL   Hemoglobin 11.2 (L) 12.0 - 16.0 g/dL   HCT 33.8 (L) 35.0 - 47.0 %   MCV 91.6 80.0 - 100.0 fL   MCH 30.3 26.0 - 34.0  pg   MCHC 33.1 32.0 - 36.0 g/dL   RDW 50.9 28.8 - 01.3 %   Platelets 301 150 - 440 K/uL   No components found for: ESR, C REACTIVE PROTEIN MICRO: Recent Results (from the past 720 hour(s))  Rapid Influenza A&B Antigens ([facility] only)     Status: None   Collection Time: 01/02/17 12:11 PM  Result Value Ref Range Status   Influenza A ([facility]) NEGATIVE NEGATIVE Final   Influenza B ([facility]) NEGATIVE NEGATIVE Final  CSF culture     Status: None (Preliminary result)   Collection Time: 01/12/17  6:31 AM  Result Value Ref Range Status   Specimen Description CSF  Final   Special Requests Normal  Final   Gram Stain   Final     MANY GRAM POSITIVE COCCI IN PAIRS  MANY WBC CRITICAL RESULT CALLED TO, READ BACK BY AND VERIFIED WITH:  Duwaine Maxin AT 2720 01/12/17 SDR    Culture PENDING  Incomplete   Report Status PENDING  Incomplete  Culture, blood (routine x 2)     Status: None (Preliminary result)   Collection Time: 01/12/17  6:54 AM  Result Value Ref Range Status   Specimen Description BLOOD R AC  Final   Special Requests BOTTLES DRAWN AEROBIC AND ANAEROBIC  Final   Culture  Setup Time   Final    Organism ID to follow GRAM POSITIVE COCCI IN BOTH AEROBIC AND ANAEROBIC BOTTLES CRITICAL RESULT CALLED TO, READ BACK BY AND VERIFIED WITH: NATE COOKSON AT 2125 ON 01/12/2017 JLJ    Culture GRAM POSITIVE COCCI  Final   Report Status PENDING  Incomplete  Culture, blood (routine x 2)     Status: None (Preliminary result)   Collection Time: 01/12/17  6:54 AM  Result Value Ref Range Status   Specimen Description BLOOD L AC  Final   Special Requests BOTTLES DRAWN AEROBIC AND ANAEROBIC  Final   Culture  Setup Time   Final    GRAM POSITIVE COCCI IN BOTH AEROBIC AND ANAEROBIC BOTTLES CRITICAL VALUE NOTED.  VALUE IS CONSISTENT WITH  PREVIOUSLY REPORTED AND CALLED VALUE.    Culture GRAM POSITIVE COCCI  Final   Report Status PENDING  Incomplete  Blood Culture ID Panel (Reflexed)     Status: Abnormal   Collection Time: 01/12/17  6:54 AM  Result Value Ref Range Status   Enterococcus species NOT DETECTED NOT DETECTED Final   Listeria monocytogenes NOT DETECTED NOT DETECTED Final   Staphylococcus species NOT DETECTED NOT DETECTED Final   Staphylococcus aureus NOT DETECTED NOT DETECTED Final   Streptococcus species DETECTED (A) NOT DETECTED Final    Comment: CRITICAL RESULT CALLED TO, READ BACK BY AND VERIFIED WITH: NATE COOKSON AT 2125 ON 01/12/2017 JLJ    Streptococcus agalactiae NOT DETECTED NOT DETECTED Final   Streptococcus pneumoniae DETECTED (A) NOT DETECTED Final    Comment: CRITICAL RESULT CALLED TO, READ BACK BY AND VERIFIED WITH: NATE COOKSON AT 2125 ON 01/12/2017 JLJ    Streptococcus pyogenes NOT DETECTED NOT DETECTED Final   Acinetobacter baumannii NOT DETECTED NOT DETECTED Final   Enterobacteriaceae species NOT DETECTED NOT DETECTED Final   Enterobacter cloacae complex NOT DETECTED NOT DETECTED Final   Escherichia coli NOT DETECTED NOT DETECTED Final   Klebsiella oxytoca NOT DETECTED NOT DETECTED Final   Klebsiella pneumoniae NOT DETECTED NOT DETECTED Final   Proteus species NOT DETECTED NOT DETECTED Final   Serratia marcescens NOT DETECTED NOT DETECTED Final   Haemophilus  influenzae NOT DETECTED NOT DETECTED Final   Neisseria meningitidis NOT DETECTED NOT DETECTED Final   Pseudomonas aeruginosa NOT DETECTED NOT DETECTED Final   Candida albicans NOT DETECTED NOT DETECTED Final   Candida glabrata NOT DETECTED NOT DETECTED Final   Candida krusei NOT DETECTED NOT DETECTED Final   Candida parapsilosis NOT DETECTED NOT DETECTED Final   Candida tropicalis NOT DETECTED NOT DETECTED Final    IMAGING: Dg Chest 2 View  Result Date: 01/12/2017 CLINICAL DATA:  Fever, headache. EXAM: CHEST  2 VIEW COMPARISON:   11/24/2016 FINDINGS: Unchanged heart size and mediastinal contours. There is peribronchial thickening. Streaky bibasilar opacities favor atelectasis. No pleural fluid or pneumothorax. No acute osseous abnormality. IMPRESSION: Peribronchial thickening, may reflect acute bronchitis. Streaky bibasilar atelectasis. Electronically Signed   By: Jeb Levering M.D.   On: 01/12/2017 06:25   Ct Head Wo Contrast  Result Date: 01/12/2017 CLINICAL DATA:  Headache nausea and vomiting for 3 days. EXAM: CT HEAD WITHOUT CONTRAST TECHNIQUE: Contiguous axial images were obtained from the base of the skull through the vertex without intravenous contrast. COMPARISON:  None. FINDINGS: Brain: No evidence of acute infarction, hemorrhage, hydrocephalus, extra-axial collection or mass lesion/mass effect. Mild chronic small vessel ischemia. Vascular: No hyperdense vessel or unexpected calcification. Skull: Normal. Negative for fracture or focal lesion. Sinuses/Orbits: Paranasal sinuses and mastoid air cells are clear. The visualized orbits are unremarkable. Other: None. IMPRESSION: No acute intracranial abnormality. Mild chronic small vessel ischemia. Electronically Signed   By: Jeb Levering M.D.   On: 01/12/2017 06:12    Assessment:   Monica Terry is a 51 y.o. female with strep PNA meningitis and bacteremia. SHe likely has PNA as well. Is quite ill but seems to have defervesced Dced ampicillin and acyclovir  Recommendations Check HIV Once sensitivities back can simplify most likely to ceftraixone 2 gm q 12 for a 10 day total course Will need PICC line to complete course as otpt or facility Started dexamethasone although unlikely to have much benefit at this point so if sugars run up or other side effects can stop it.  Thank you very much for allowing me to participate in the care of this patient. Please call with questions.   Cheral Marker. Ola Spurr, MD

## 2017-01-14 LAB — CBC WITH DIFFERENTIAL/PLATELET
BASOS ABS: 0 10*3/uL (ref 0–0.1)
BASOS PCT: 0 %
EOS ABS: 0 10*3/uL (ref 0–0.7)
Eosinophils Relative: 0 %
HEMATOCRIT: 33.5 % — AB (ref 35.0–47.0)
Hemoglobin: 11.3 g/dL — ABNORMAL LOW (ref 12.0–16.0)
Lymphocytes Relative: 11 %
Lymphs Abs: 2.5 10*3/uL (ref 1.0–3.6)
MCH: 30.4 pg (ref 26.0–34.0)
MCHC: 33.8 g/dL (ref 32.0–36.0)
MCV: 89.9 fL (ref 80.0–100.0)
MONO ABS: 0.3 10*3/uL (ref 0.2–0.9)
Monocytes Relative: 1 %
NEUTROS ABS: 20.9 10*3/uL — AB (ref 1.4–6.5)
Neutrophils Relative %: 88 %
PLATELETS: 328 10*3/uL (ref 150–440)
RBC: 3.73 MIL/uL — ABNORMAL LOW (ref 3.80–5.20)
RDW: 14.3 % (ref 11.5–14.5)
WBC: 23.7 10*3/uL — ABNORMAL HIGH (ref 3.6–11.0)

## 2017-01-14 LAB — BASIC METABOLIC PANEL
ANION GAP: 6 (ref 5–15)
BUN: 13 mg/dL (ref 6–20)
CO2: 25 mmol/L (ref 22–32)
CREATININE: 0.75 mg/dL (ref 0.44–1.00)
Calcium: 7.1 mg/dL — ABNORMAL LOW (ref 8.9–10.3)
Chloride: 110 mmol/L (ref 101–111)
Glucose, Bld: 222 mg/dL — ABNORMAL HIGH (ref 65–99)
Potassium: 3.8 mmol/L (ref 3.5–5.1)
SODIUM: 141 mmol/L (ref 135–145)

## 2017-01-14 LAB — URINE CULTURE: Culture: <10000 — AB

## 2017-01-14 LAB — VANCOMYCIN, TROUGH: VANCOMYCIN TR: 15 ug/mL (ref 15–20)

## 2017-01-14 LAB — CSF CULTURE W GRAM STAIN

## 2017-01-14 LAB — CSF CULTURE: SPECIAL REQUESTS: NORMAL

## 2017-01-14 NOTE — Progress Notes (Signed)
Pharmacy Antibiotic Note  Monica Terry is a 51 y.o. female admitted on 01/12/2017 with meningitis. Pharmacy has been consulted for ceftriaxone and vancomycin dosing. Patient also ordered ampicillin 2g IV Q4hr.   Plan: Ceftriaxone 2g IV Q12hr.   Vancomycin 1g IV x 1 in ED. Will start patient on vancomycin 1500mg  IV Q12hr for goal trough of 15-20. Will obtain trough prior to am dose on 1/20.    1/20 0255 vancomycin trough 15 mcg/mL. Continue current dose and frequency. Pharmacy will continue to follow and adjust as needed.  Height: 5\' 8"  (172.7 cm) Weight: 230 lb 12.8 oz (104.7 kg) IBW/kg (Calculated) : 63.9  Temp (24hrs), Avg:98.3 F (36.8 C), Min:98 F (36.7 C), Max:98.5 F (36.9 C)   Recent Labs Lab 01/12/17 0527 01/13/17 0414 01/14/17 0255  WBC 26.4* 31.5*  --   CREATININE 0.74  --  0.75  VANCOTROUGH  --   --  15    Estimated Creatinine Clearance: 105.3 mL/min (by C-G formula based on SCr of 0.75 mg/dL).    No Known Allergies  Antimicrobials this admission: Ampicillin 1/18 >>  Ceftriaxone 1/18 >>  Vancomycin 1/18 >>  Dose adjustments this admission: N/A  Microbiology results: 1/18 BCx: no growth < 12 hours  1/18 CSF Cx: many gram positive cocci in pairs   Pharmacy will continue to monitor and adjust per consult.   Laural Benes, Pharm.D., BCPS Clinical Pharmacist 01/14/2017 3:33 AM

## 2017-01-14 NOTE — Progress Notes (Signed)
Pharmacy Antibiotic Note  Monica Terry is a 51 y.o. female admitted on 01/12/2017 with meningitis. Pharmacy has been consulted for ceftriaxone and vancomycin dosing. Patient also ordered ampicillin 2g IV Q4hr.   ID d/ced ampicillin and Vanc. Plan continue CTX x 10 days  Plan: Ceftriaxone 2g IV Q12hr.    Height: 5\' 8"  (172.7 cm) Weight: 230 lb 12.8 oz (104.7 kg) IBW/kg (Calculated) : 63.9  Temp (24hrs), Avg:97.9 F (36.6 C), Min:97.7 F (36.5 C), Max:98 F (36.7 C)   Recent Labs Lab 01/12/17 0527 01/13/17 0414 01/14/17 0255  WBC 26.4* 31.5* 23.7*  CREATININE 0.74  --  0.75  VANCOTROUGH  --   --  15    Estimated Creatinine Clearance: 105.3 mL/min (by C-G formula based on SCr of 0.75 mg/dL).    No Known Allergies  Antimicrobials this admission: Ampicillin 1/18 >> 1/20 Ceftriaxone 1/18 >>  Vancomycin 1/18 >>1/20  Dose adjustments this admission: N/A  Microbiology results: 1/18 BCx: + Strep Pneumo  1/18 CSF Cx: many gram positive cocci in pairs = Strep Pneumo  Pharmacy will continue to monitor and adjust per consult.   [name] A, Pharm.D., BCPS Clinical Pharmacist 01/14/2017 5:35 PM

## 2017-01-14 NOTE — Progress Notes (Signed)
ID enote Afebrile CX returns with Strep PNA pan sensitive  Rec Place picc DC vanco  Can use IV ceftriaxone 2 gm q 12 for 10 days total.

## 2017-01-14 NOTE — Plan of Care (Signed)
Problem: Education: Goal: Knowledge of [facility] Education information/materials will improve Outcome: Progressing VS WDL, free of falls during shift.  Ambulated to bathroom, one assist.  HA/neck pain well-controlled during shift, received PRN Percocet 10mg  x1.  No other complaints.  Bed in low position, call bell within reach.  WCTM.

## 2017-01-14 NOTE — Progress Notes (Signed)
[facility] [place] NAME: Monica Terry    MR#:  [id number]  DATE OF BIRTH:  1966/09/06  SUBJECTIVE: Complaints of headache more on the right side but overall much better than when she came in. Mild nausea. No double vision or photophobia. Does have some cough. No fever. No neck stiffness.   CHIEF COMPLAINT:   Chief Complaint  Patient presents with  . Headache   Continues to have headache, photophobia and neck stiffness  REVIEW OF SYSTEMS:    Review of Systems  Constitutional: Positive for malaise/fatigue. Negative for chills and fever.  HENT: Negative for hearing loss and sore throat.   Eyes: Negative for blurred vision, double vision, photophobia and pain.  Respiratory: Positive for cough. Negative for hemoptysis, shortness of breath and wheezing.   Cardiovascular: Negative for chest pain, palpitations, orthopnea and leg swelling.  Gastrointestinal: Negative for abdominal pain, constipation, diarrhea, heartburn, nausea and vomiting.  Genitourinary: Negative for dysuria and hematuria.  Musculoskeletal: Negative for back pain and joint pain.  Skin: Negative for rash.  Neurological: Positive for weakness and headaches. Negative for sensory change, speech change and focal weakness.  Endo/Heme/Allergies: Does not bruise/bleed easily.  Psychiatric/Behavioral: Negative for depression. The patient is not nervous/anxious.     DRUG ALLERGIES:  No Known Allergies  VITALS:  Blood pressure (!) 157/66, pulse 78, temperature 97.7 F (36.5 C), temperature source Oral, resp. rate 20, height 5\' 8"  (1.727 m), weight 104.7 kg (230 lb 12.8 oz), last menstrual period 01/30/2016, SpO2 94 %.  PHYSICAL EXAMINATION:   Physical Exam  GENERAL:  51 y.o.-year-old patient lying in the bed. Anxious and agitated due to her headache EYES: Pupils equal, round, reactive to light and accommodation. No scleral icterus. Extraocular muscles intact.  HEENT: Head  atraumatic, normocephalic. Oropharynx and nasopharynx clear.  NECK:  Stiffness positive, no jugular venous distention. No thyroid enlargement, no tenderness.  LUNGS: Normal breath sounds bilaterally, no wheezing, rales, rhonchi. No use of accessory muscles of respiration.  CARDIOVASCULAR: S1, S2 normal. No murmurs, rubs, or gallops.  ABDOMEN: Soft, nontender, nondistended. Bowel sounds present. No organomegaly or mass.  EXTREMITIES: No cyanosis, clubbing or edema b/l.    NEUROLOGIC: Cranial nerves II through XII are intact. No focal Motor or sensory deficits b/l.   PSYCHIATRIC: The patient is anxious SKIN: No obvious rash, lesion, or ulcer.   LABORATORY PANEL:   CBC  Recent Labs Lab 01/14/17 0255  WBC 23.7*  HGB 11.3*  HCT 33.5*  PLT 328   ------------------------------------------------------------------------------------------------------------------ Chemistries   Recent Labs Lab 01/14/17 0255  NA 141  K 3.8  CL 110  CO2 25  GLUCOSE 222*  BUN 13  CREATININE 0.75  CALCIUM 7.1*   ------------------------------------------------------------------------------------------------------------------  Cardiac Enzymes No results for input(s): TROPONINI in the last 168 hours. ------------------------------------------------------------------------------------------------------------------  RADIOLOGY:  No results found.   ASSESSMENT AND PLAN:   * Acute bacterial Meningitis, most likely due to strep pneumonia. Strep pneumonia bacteremia Gram-positive pairs and CSF cultures. And blood cultures with strep pneumonia. Discontinue ampicillin. Continue ceftriaxone and vancomycin. Wait for final culture and sensitivities.  Seen by ID physician, discontinue isolation as per his recommendation, wait for culture and sensitivity for discharge. Antibiotics* started on IV Decadron, PICC line placement to complete the course of antibiotics, Once sensitivities back can simplify most likely  to ceftraixone 2 gm q 12 for a 10 day total course Sepsis present on admission. Improved. Discontinue IV fluids.  * COPD stable; continue present  medications  * Bipolar disorder;seen by psych/   All the records are reviewed and case discussed with Care Management/Social Workerr. Management plans discussed with the patient, family and they are in agreement.  CODE STATUS: FULL CODE  DVT Prophylaxis: SCDs  TOTAL TIME TAKING CARE OF THIS PATIENT: 35 minutes.   POSSIBLE D/C IN 2-3 DAYS, DEPENDING ON CLINICAL CONDITION.  Epifanio Lesches M.D on 01/14/2017 at 9:00 AM  Between 7am to 6pm - Pager - [phone]  After 6pm go to [email or web address] - password [email or web address] [facility]  Office  [phone]  CC: Primary care physician; Lorelee Market, MD  Note: This dictation was prepared with Dragon dictation along with smaller phrase technology. Any transcriptional errors that result from this process are unintentional.

## 2017-01-14 NOTE — Plan of Care (Signed)
Problem: Pain Managment: Goal: General experience of comfort will improve Outcome: Progressing Pt not requiring IV pain medicine anymore.   Problem: Physical Regulation: Goal: Will remain free from infection Outcome: Progressing PICC line placed today for long term antibiotics

## 2017-01-15 LAB — CBC
HEMATOCRIT: 32.5 % — AB (ref 35.0–47.0)
HEMOGLOBIN: 11 g/dL — AB (ref 12.0–16.0)
MCH: 30.7 pg (ref 26.0–34.0)
MCHC: 33.8 g/dL (ref 32.0–36.0)
MCV: 90.7 fL (ref 80.0–100.0)
Platelets: 360 10*3/uL (ref 150–440)
RBC: 3.59 MIL/uL — AB (ref 3.80–5.20)
RDW: 14.7 % — ABNORMAL HIGH (ref 11.5–14.5)
WBC: 21.1 10*3/uL — ABNORMAL HIGH (ref 3.6–11.0)

## 2017-01-15 LAB — CULTURE, BLOOD (ROUTINE X 2)

## 2017-01-15 MED ORDER — DEXAMETHASONE SODIUM PHOSPHATE 10 MG/ML IJ SOLN
12.0000 mg | Freq: Four times a day (QID) | INTRAMUSCULAR | Status: DC
  Administered 2017-01-15 – 2017-01-16 (×5): 12 mg via INTRAVENOUS
  Filled 2017-01-15 (×6): qty 1.2

## 2017-01-15 MED ORDER — DEXAMETHASONE SODIUM PHOSPHATE 4 MG/ML IJ SOLN
INTRAMUSCULAR | Status: AC
  Administered 2017-01-15: 16 mg via INTRAVENOUS
  Filled 2017-01-15: qty 1

## 2017-01-15 NOTE — Plan of Care (Signed)
Problem: Education: Goal: Knowledge of [facility] Education information/materials will improve Outcome: Progressing VSS, free of falls during shift.  Ambulated to bathroom independently.  Reported earache 6/10, improved w/ PRN Percocet 10mg .  No other complaints overnight.  Bed in low position, call bell within reach.  WCTM.

## 2017-01-15 NOTE — Plan of Care (Signed)
Problem: Physical Regulation: Goal: Will remain free from infection Outcome: Progressing Pt receiving IV antibiotics   

## 2017-01-15 NOTE — Care Management Note (Signed)
Case Management Note  Patient Details  Name: Monica Terry MRN: [id number] Date of Birth: 1966-07-24  Subjective/Objective:     Prescription for IV Rocephin 2 grams every 12 hours x 10 days was faxed to the [facility] today with request for delivery to the home on Monday. This Probation officer asked Dr Vianne Bulls to please order a home health nurse in [place] so that a home health nurse can be arranged through [facility] today to start tomorrow.               Action/Plan:   Expected Discharge Date:                  Expected Discharge Plan:     In-House Referral:     Discharge planning Services     Post Acute Care Choice:    Choice offered to:     DME Arranged:    DME Agency:     HH Arranged:    HH Agency:     Status of Service:     If discussed at [employer] of Stay Meetings, dates discussed:    Additional Comments:  Heather Streeper A, RN 01/15/2017, 1:02 PM

## 2017-01-15 NOTE — Care Management Note (Addendum)
Case Management Note  Patient Details  Name: Monica Terry MRN: [id number] Date of Birth: 1966/04/08  Subjective/Objective:     Discussed discharge planning with IV ABX with Ms Jaquetta Hartranft. She chose [facility] to be her home health provider, and [facility] to be her IV antibiotic provider. PICC line in place. Mrs Kirch reports that there will be someone at her home to receive the Antibiotic delivery from the pharmacy both today and tomorrow. Mrs Zirkle states that she wants to learn how to administer her IV antibiotic herself at home. We discussed the role of the [facility] nurse. Case management will continue to follow to facilitate this discharge plan.   Action/Plan:   Expected Discharge Date:                  Expected Discharge Plan:     In-House Referral:     Discharge planning Services     Post Acute Care Choice:    Choice offered to:     DME Arranged:    DME Agency:     HH Arranged:    HH Agency:     Status of Service:     If discussed at [employer] of Stay Meetings, dates discussed:    Additional Comments:  Delwin Raczkowski A, RN 01/15/2017, 9:51 AM

## 2017-01-15 NOTE — Progress Notes (Signed)
[facility] [place] NAME: Monica Terry    MR#:  [id number]  DATE OF BIRTH:  03-02-1966  SUBJECTIVE: c/o complaints of right ear pain but denies headache or neck pain. No fever.   CHIEF COMPLAINT:   Chief Complaint  Patient presents with  . Headache     REVIEW OF SYSTEMS:    Review of Systems  Constitutional: Negative for chills and fever.  HENT: Negative for hearing loss and sore throat.   Eyes: Negative for blurred vision, double vision, photophobia and pain.  Respiratory: Negative for cough, hemoptysis, shortness of breath and wheezing.   Cardiovascular: Negative for chest pain, palpitations, orthopnea and leg swelling.  Gastrointestinal: Negative for abdominal pain, constipation, diarrhea, heartburn, nausea and vomiting.  Genitourinary: Negative for dysuria and hematuria.  Musculoskeletal: Negative for back pain and joint pain.  Skin: Negative for rash.  Neurological: Negative for sensory change, speech change, focal weakness, weakness and headaches.  Endo/Heme/Allergies: Does not bruise/bleed easily.  Psychiatric/Behavioral: Negative for depression. The patient is not nervous/anxious.     DRUG ALLERGIES:  No Known Allergies  VITALS:  Blood pressure 119/66, pulse 77, temperature 98.2 F (36.8 C), temperature source Oral, resp. rate 20, height 5\' 8"  (1.727 m), weight 104.7 kg (230 lb 12.8 oz), last menstrual period 01/30/2016, SpO2 93 %.  PHYSICAL EXAMINATION:   Physical Exam  GENERAL:  51 y.o.-year-old patient lying in the bed. Anxious and agitated due to her headache EYES: Pupils equal, round, reactive to light and accommodation. No scleral icterus. Extraocular muscles intact.  HEENT: Head atraumatic, normocephalic. Oropharynx and nasopharynx clear. Tympanic membrane on the right side congested but no evidence of otitis media. NECK:  Stiffness positive, no jugular venous distention. No thyroid enlargement, no tenderness.   LUNGS: Normal breath sounds bilaterally, no wheezing, rales, rhonchi. No use of accessory muscles of respiration.  CARDIOVASCULAR: S1, S2 normal. No murmurs, rubs, or gallops.  ABDOMEN: Soft, nontender, nondistended. Bowel sounds present. No organomegaly or mass.  EXTREMITIES: No cyanosis, clubbing or edema b/l.    NEUROLOGIC: Cranial nerves II through XII are intact. No focal Motor or sensory deficits b/l.   PSYCHIATRIC: The patient is anxious SKIN: No obvious rash, lesion, or ulcer.   LABORATORY PANEL:   CBC  Recent Labs Lab 01/15/17 0630  WBC 21.1*  HGB 11.0*  HCT 32.5*  PLT 360   ------------------------------------------------------------------------------------------------------------------ Chemistries   Recent Labs Lab 01/14/17 0255  NA 141  K 3.8  CL 110  CO2 25  GLUCOSE 222*  BUN 13  CREATININE 0.75  CALCIUM 7.1*   ------------------------------------------------------------------------------------------------------------------  Cardiac Enzymes No results for input(s): TROPONINI in the last 168 hours. ------------------------------------------------------------------------------------------------------------------  RADIOLOGY:  No results found.   ASSESSMENT AND PLAN:   * Acute bacterial Meningitis, most likely due to strep pneumonia. Strep pneumonia bacteremia Patient needs IV Rocephin 2 g every 12 hours for 10 days total. Patient received 4 days of IV Rocephin 2 g every 12  d so far. Needs home health set up for IV antibiotics which will be done tomorrow as per discussion with the case manager, will set up home health for IV antibiotics through PICC line.prescritpion written.  * COPD stable; continue present medications  * Bipolar disorder;seen by psych/   All the records are reviewed and case discussed with Care Management/Social Workerr. Management plans discussed with the patient, family and they are in agreement.  CODE STATUS: FULL CODE  DVT  Prophylaxis: SCDs  TOTAL TIME  TAKING CARE OF THIS PATIENT: 35 minutes.   POSSIBLE D/C IN 2-3 DAYS, DEPENDING ON CLINICAL CONDITION.  Epifanio Lesches M.D on 01/15/2017 at 10:47 AM  Between 7am to 6pm - Pager - [phone]  After 6pm go to [email or web address] - password [email or web address] [facility]  Office  [phone]  CC: Primary care physician; Lorelee Market, MD  Note: This dictation was prepared with Dragon dictation along with smaller phrase technology. Any transcriptional errors that result from this process are unintentional.

## 2017-01-16 LAB — CBC
HCT: 34.1 % — ABNORMAL LOW (ref 35.0–47.0)
HEMOGLOBIN: 11.3 g/dL — AB (ref 12.0–16.0)
MCH: 30.3 pg (ref 26.0–34.0)
MCHC: 33 g/dL (ref 32.0–36.0)
MCV: 91.7 fL (ref 80.0–100.0)
Platelets: 379 10*3/uL (ref 150–440)
RBC: 3.72 MIL/uL — ABNORMAL LOW (ref 3.80–5.20)
RDW: 15 % — AB (ref 11.5–14.5)
WBC: 21.5 10*3/uL — ABNORMAL HIGH (ref 3.6–11.0)

## 2017-01-16 MED ORDER — OXYCODONE-ACETAMINOPHEN 5-325 MG PO TABS: 1.0000 | ORAL_TABLET | Freq: Four times a day (QID) | ORAL | 0 refills | Status: DC | PRN

## 2017-01-16 MED ORDER — CEFTRIAXONE SODIUM-DEXTROSE 2-2.22 GM-% IV SOLR: 2.0000 g | Freq: Two times a day (BID) | INTRAVENOUS | Status: DC

## 2017-01-16 NOTE — Care Management (Signed)
Discharge to home today per Dr. Darvin Neighbours. Will receive Ceftriaxone IV this afternoon at 6:00pm. [facility] will deliver IV medications tonight. The next IV medication will be given by [facility] staff tomorrow morning. Teaching will also take place at this time.                                                                                                               Shelbie Ammons RN MSN CCM Care management

## 2017-01-16 NOTE — Progress Notes (Signed)
Infectious Disease Long Term IV Antibiotic Orders  Diagnosis: strep PNA bacteremia and meningitis  Culture results Strep PNA  Allergies: No Known Allergies  Discharge antibiotics Ceftriaxone 2 grams every       12        hours  PICC Care per protocol Labs weekly while on IV antibiotics      CBC w diff   Comprehensive met panel   Planned duration of antibiotics 10 days from 1/18  Stop date 1/28   Follow up clinic date 1 week  FAX weekly labs to [phone]  Leonel Ramsay, MD

## 2017-01-16 NOTE — Progress Notes (Signed)
Discussed discharge instructions and medications with pt. IV removed. All questions addressed. Pt transported home via car by her sister.  Danielle Tajuanna Burnett, RN 

## 2017-01-16 NOTE — Progress Notes (Signed)
[facility] INFECTIOUS DISEASE PROGRESS NOTE Date of Admission:  01/12/2017     ID: Monica Terry is a 51 y.o. female with strep PNA meningitis Active Problems:   Bipolar affective disorder ([facility])   Meningitis   Subjective: Much much better  ROS  Eleven systems are reviewed and negative except per hpi  Medications:  Antibiotics Given (last 72 hours)    Date/Time Action Medication Dose Rate   01/13/17 1523 Given   vancomycin (VANCOCIN) 1,500 mg in sodium chloride 0.9 % 500 mL IVPB 1,500 mg 250 mL/hr   01/13/17 2056 Given   cefTRIAXone (ROCEPHIN) IVPB 2 g 2 g 100 mL/hr   01/14/17 0359 Given   vancomycin (VANCOCIN) 1,500 mg in sodium chloride 0.9 % 500 mL IVPB 1,500 mg 250 mL/hr   01/14/17 0738 Given   cefTRIAXone (ROCEPHIN) IVPB 2 g 2 g 100 mL/hr   01/14/17 1956 Given   cefTRIAXone (ROCEPHIN) IVPB 2 g 2 g 100 mL/hr   01/15/17 0729 Given   cefTRIAXone (ROCEPHIN) IVPB 2 g 2 g 100 mL/hr   01/15/17 2004 Given   cefTRIAXone (ROCEPHIN) IVPB 2 g 2 g 100 mL/hr   01/16/17 0816 Given   cefTRIAXone (ROCEPHIN) IVPB 2 g 2 g 100 mL/hr     . aspirin EC  81 mg Oral Daily  . cefTRIAXone  2 g Intravenous Q12H  . dexamethasone  12 mg Intravenous Q6H  . docusate sodium  100 mg Oral BID  . enoxaparin (LOVENOX) injection  40 mg Subcutaneous Q24H  . estradiol  1 mg Oral Daily  . levothyroxine  150 mcg Oral QAC breakfast  . loratadine  10 mg Oral Daily  . mometasone-formoterol  2 puff Inhalation BID  . pregabalin  100 mg Oral Daily  . simvastatin  20 mg Oral Daily  . tiotropium  18 mcg Inhalation BH-q7a    Objective: Vital signs in last 24 hours: Temp:  [97.7 F (36.5 C)-98.5 F (36.9 C)] 97.7 F (36.5 C) (01/22 1255) Pulse Rate:  [70-80] 70 (01/22 1255) Resp:  [18-21] 20 (01/22 0906) BP: (133-159)/(66-90) 135/66 (01/22 1255) SpO2:  [95 %-98 %] 95 % (01/22 1255) Physical Exam  Constitutional:  oriented to person, place, and time. appears well-developed and well-nourished. No  distress.  HENT: [place]/AT, PERRLA, no scleral icterus Mouth/Throat: Oropharynx is clear and moist. No oropharyngeal exudate.  Cardiovascular: Normal rate, regular rhythm and normal heart sounds. Exam reveals no gallop and no friction rub.  No murmur heard.  Pulmonary/Chest: Effort normal and breath sounds normal. No respiratory distress.  has no wheezes.  Neck = supple, no nuchal rigidity Abdominal: Soft. Bowel sounds are normal.  exhibits no distension. There is no tenderness.  Lymphadenopathy: no cervical adenopathy. No axillary adenopathy Neurological: alert and oriented to person, place, and time.  Skin: Skin is warm and dry. No rash noted. No erythema.  Psychiatric: a normal mood and affect.  behavior is normal.    Lab Results  Recent Labs  01/14/17 0255 01/15/17 0630 01/16/17 0427  WBC 23.7* 21.1* 21.5*  HGB 11.3* 11.0* 11.3*  HCT 33.5* 32.5* 34.1*  NA 141  --   --   K 3.8  --   --   CL 110  --   --   CO2 25  --   --   BUN 13  --   --   CREATININE 0.75  --   --     Microbiology: Results for orders placed or performed during the hospital encounter  of 01/12/17  Urine culture     Status: Abnormal   Collection Time: 01/12/17  6:02 AM  Result Value Ref Range Status   Specimen Description URINE, RANDOM  Final   Special Requests NONE  Final   Culture (A)  Final    <10,000 COLONIES/mL INSIGNIFICANT GROWTH Performed at [facility], [address], [place], [place] [address]    Report Status 01/14/2017 FINAL  Final  CSF culture     Status: None   Collection Time: 01/12/17  6:31 AM  Result Value Ref Range Status   Specimen Description CSF  Final   Special Requests Normal  Final   Gram Stain   Final     MANY GRAM POSITIVE COCCI IN PAIRS  MANY WBC CRITICAL RESULT CALLED TO, READ BACK BY AND VERIFIED WITH:  Lillia Mountain AT [id number] 01/12/17 SDR    Culture ABUNDANT STREPTOCOCCUS PNEUMONIAE  Final   Report Status 01/14/2017 FINAL  Final   Organism ID, Bacteria  STREPTOCOCCUS PNEUMONIAE  Final      Susceptibility   Streptococcus pneumoniae - MIC*    ERYTHROMYCIN <=0.12 SENSITIVE Sensitive     LEVOFLOXACIN 0.5 SENSITIVE Sensitive     PENICILLIN <=0.06 SENSITIVE Sensitive     CEFTRIAXONE <=0.12 SENSITIVE Sensitive     * ABUNDANT STREPTOCOCCUS PNEUMONIAE  Culture, blood (routine x 2)     Status: Abnormal   Collection Time: 01/12/17  6:54 AM  Result Value Ref Range Status   Specimen Description BLOOD RIGHT ANTECUBITAL  Final   Special Requests BOTTLES DRAWN AEROBIC AND ANAEROBIC 4ML  Final   Culture  Setup Time   Final    GRAM POSITIVE COCCI IN BOTH AEROBIC AND ANAEROBIC BOTTLES CRITICAL RESULT CALLED TO, READ BACK BY AND VERIFIED WITH: NATE COOKSON AT 2125 ON 01/12/2017 JLJ Performed at [facility], [place] [address], [place], [place] [address]    Culture STREPTOCOCCUS PNEUMONIAE (A)  Final   Report Status 01/15/2017 FINAL  Final   Organism ID, Bacteria STREPTOCOCCUS PNEUMONIAE  Final      Susceptibility   Streptococcus pneumoniae - MIC*    ERYTHROMYCIN <=0.12 SENSITIVE Sensitive     LEVOFLOXACIN 0.5 SENSITIVE Sensitive     PENICILLIN <=0.06 SENSITIVE Sensitive     CEFTRIAXONE <=0.12 SENSITIVE Sensitive     * STREPTOCOCCUS PNEUMONIAE  Culture, blood (routine x 2)     Status: Abnormal   Collection Time: 01/12/17  6:54 AM  Result Value Ref Range Status   Specimen Description BLOOD L AC  Final   Special Requests BOTTLES DRAWN AEROBIC AND ANAEROBIC 10ML  Final   Culture  Setup Time   Final    GRAM POSITIVE COCCI IN BOTH AEROBIC AND ANAEROBIC BOTTLES CRITICAL VALUE NOTED.  VALUE IS CONSISTENT WITH PREVIOUSLY REPORTED AND CALLED VALUE.    Culture (A)  Final    STREPTOCOCCUS PNEUMONIAE SUSCEPTIBILITIES PERFORMED ON PREVIOUS CULTURE WITHIN THE LAST 5 DAYS. Performed at [facility], [place] [address] [place],  [address]    Report Status 01/15/2017 FINAL  Final  Blood Culture ID Panel (Reflexed)     Status: Abnormal    Collection Time: 01/12/17  6:54 AM  Result Value Ref Range Status   Enterococcus species NOT DETECTED NOT DETECTED Final   Listeria monocytogenes NOT DETECTED NOT DETECTED Final   Staphylococcus species NOT DETECTED NOT DETECTED Final   Staphylococcus aureus NOT DETECTED NOT DETECTED Final   Streptococcus species DETECTED (A) NOT DETECTED Final  Comment: CRITICAL RESULT CALLED TO, READ BACK BY AND VERIFIED WITH: NATE COOKSON AT 2125 ON 01/12/2017 JLJ    Streptococcus agalactiae NOT DETECTED NOT DETECTED Final   Streptococcus pneumoniae DETECTED (A) NOT DETECTED Final    Comment: CRITICAL RESULT CALLED TO, READ BACK BY AND VERIFIED WITH: NATE COOKSON AT 2125 ON 01/12/2017 JLJ    Streptococcus pyogenes NOT DETECTED NOT DETECTED Final   Acinetobacter baumannii NOT DETECTED NOT DETECTED Final   Enterobacteriaceae species NOT DETECTED NOT DETECTED Final   Enterobacter cloacae complex NOT DETECTED NOT DETECTED Final   Escherichia coli NOT DETECTED NOT DETECTED Final   Klebsiella oxytoca NOT DETECTED NOT DETECTED Final   Klebsiella pneumoniae NOT DETECTED NOT DETECTED Final   Proteus species NOT DETECTED NOT DETECTED Final   Serratia marcescens NOT DETECTED NOT DETECTED Final   Haemophilus influenzae NOT DETECTED NOT DETECTED Final   Neisseria meningitidis NOT DETECTED NOT DETECTED Final   Pseudomonas aeruginosa NOT DETECTED NOT DETECTED Final   Candida albicans NOT DETECTED NOT DETECTED Final   Candida glabrata NOT DETECTED NOT DETECTED Final   Candida krusei NOT DETECTED NOT DETECTED Final   Candida parapsilosis NOT DETECTED NOT DETECTED Final   Candida tropicalis NOT DETECTED NOT DETECTED Final    Studies/Results: No results found.  Assessment/Plan: Faydra Schwebel is a 51 y.o. female with strep PNA meningitis and bacteremia. She likely has PNA as well. Was quite ill has improved nicely. Is now on ctx only HIV neg  Recommendations Dc on 10 days ctx and fu with me - see iv abx  order sheet Thank you very much for the consult. Will follow with you.  [place], Naila Elizondo P   01/16/2017, 2:37 PM

## 2017-01-19 DIAGNOSIS — R7881 Bacteremia: Secondary | ICD-10-CM | POA: Diagnosis present

## 2017-01-19 NOTE — Discharge Summary (Signed)
[facility] [place] NAME: Monica Terry    MR#:  [id number]  DATE OF BIRTH:  1966-07-22  DATE OF ADMISSION:  01/12/2017 ADMITTING PHYSICIAN: Bettey Costa, MD  DATE OF DISCHARGE: 01/16/2017  7:02 PM  PRIMARY CARE PHYSICIAN: Lorelee Market, MD   ADMISSION DIAGNOSIS:  Meningitis [G03.9]  DISCHARGE DIAGNOSIS:  Active Problems:   Bipolar affective disorder ([place])   Meningitis   SECONDARY DIAGNOSIS:   Past Medical History:  Diagnosis Date  . Abnormal uterine bleeding   . Anemia   . Anxiety   . Arthritis    NECK  . Bipolar 1 disorder ([place])   . Brittle bones   . COPD (chronic obstructive pulmonary disease) ([place])   . Depression   . GERD (gastroesophageal reflux disease)   . Heart murmur   . Hepatitis 2016   C  . Hypercholesteremia   . Hypothyroidism   . Neck pain   . Neck pain, chronic   . Panic attack   . Sciatica   . Shortness of breath dyspnea   . Thyroid disease      ADMITTING HISTORY  HISTORY OF PRESENT ILLNESS:  Monica Terry  is a 51 y.o. female with a known history of  COPD and bipolar 1 disorder who presents with headache. Patient reports over the past several days she has had a severe headache, photophobia neck stiffness and fever. She reports no sinus infection or sick contacts.  Lumbar puncture performed in the emergency room shows evidence of bacterial meningitis. She was initially started on Rocephin, vancomycin and ampicillin.   HOSPITAL COURSE:   * Acute bacterial Meningitis,  due to strep pneumonia.  * Strep pneumonia bacteremia * sepsis present on admission has resolved LP showed meningitis. Patient was started on vancomycin and ceftriaxone and ampicillin. After we had results about Streptococcus pneumonia ampicillin was stopped. Due to her bacteremia and this time plan is to treat patient with IV ceftriaxone as outpatient through PICC line for 10 days. Seen by infectious disease Dr.  Ola Spurr.  Patient's headache has resolved. She is stable for discharge.  * COPD stable  continue present medications  * Bipolar disorder;seen by psych No change in medications  CONSULTS OBTAINED:  Treatment Team:  Leotis Pain, MD Gonzella Lex, MD Leonel Ramsay, MD  DRUG ALLERGIES:  No Known Allergies  DISCHARGE MEDICATIONS:   Discharge Medication List as of 01/16/2017  2:27 PM    START taking these medications   Details  cefTRIAXone (ROCEPHIN) 2-2.22 GM-% IVPB Inject 50 mLs (2 g total) into the vein every 12 (twelve) hours., Starting Mon 01/16/2017, No Print      CONTINUE these medications which have CHANGED   Details  oxyCODONE-acetaminophen (PERCOCET/ROXICET) 5-325 MG tablet Take 1 tablet by mouth every 6 (six) hours as needed for severe pain (moderate to severe pain (when tolerating fluids))., Starting Mon 01/16/2017, Print      CONTINUE these medications which have NOT CHANGED   Details  albuterol (ACCUNEB) 1.25 MG/3ML nebulizer solution Take 1 ampule by nebulization every 6 (six) hours as needed for wheezing., Historical Med    albuterol (PROAIR HFA) 108 (90 BASE) MCG/ACT inhaler Inhale 1-2 puffs into the lungs every 6 (six) hours as needed. , Historical Med    alendronate (FOSAMAX) 70 MG tablet Take 70 mg by mouth once a week. Take with a full glass of water on an empty stomach., Historical Med    aspirin 81 MG tablet Take 81  mg by mouth daily., Historical Med    cetirizine (ZYRTEC) 10 MG tablet Take 10 mg by mouth daily. , Historical Med    diazepam (VALIUM) 5 MG tablet Take 1 tablet (5 mg total) by mouth every 8 (eight) hours as needed for anxiety., Starting Sat 06/20/2015, Print    docusate sodium (COLACE) 100 MG capsule TAKE 1 CAPSULE BY MOUTH TWICE DAILY, Normal    estradiol (ESTRACE) 1 MG tablet Take 1 tablet (1 mg total) by mouth daily., Starting Wed 05/25/2016, Normal    Fluticasone-Salmeterol (ADVAIR DISKUS) 250-50 MCG/DOSE AEPB Inhale 1  puff into the lungs 2 (two) times daily., Historical Med    gabapentin (NEURONTIN) 100 MG capsule Take 100 mg by mouth as needed. , Until Discontinued, Historical Med    levothyroxine (SYNTHROID, LEVOTHROID) 150 MCG tablet Take 150 mcg by mouth daily before breakfast., Historical Med    ondansetron (ZOFRAN) 4 MG tablet Take 1 tablet (4 mg total) by mouth every 8 (eight) hours as needed for nausea or vomiting., Starting Mon 01/02/2017, Print    pregabalin (LYRICA) 100 MG capsule Take 100 mg by mouth daily. , Historical Med    ranitidine (ZANTAC) 150 MG capsule Take 150 mg by mouth as needed for heartburn., Historical Med    simvastatin (ZOCOR) 20 MG tablet Take 20 mg by mouth every morning., Historical Med    tiotropium (SPIRIVA HANDIHALER) 18 MCG inhalation capsule Place 18 mcg into inhaler and inhale every morning., Historical Med    tiZANidine (ZANAFLEX) 4 MG tablet Take 4 mg by mouth every 6 (six) hours as needed. , Starting Wed 10/08/2014, Historical Med        Today   VITAL SIGNS:  Blood pressure 135/66, pulse 70, temperature 97.7 F (36.5 C), temperature source Oral, resp. rate 20, height 5\' 8"  (1.727 m), weight 104.7 kg (230 lb 12.8 oz), last menstrual period 01/30/2016, SpO2 95 %.  I/O:  No intake or output data in the 24 hours ending 01/19/17 1508  PHYSICAL EXAMINATION:  Physical Exam  GENERAL:  51 y.o.-year-old patient lying in the bed with no acute distress.  LUNGS: Normal breath sounds bilaterally, no wheezing, rales,rhonchi or crepitation. No use of accessory muscles of respiration.  CARDIOVASCULAR: S1, S2 normal. No murmurs, rubs, or gallops.  ABDOMEN: Soft, non-tender, non-distended. Bowel sounds present. No organomegaly or mass.  NEUROLOGIC: Moves all 4 extremities. PSYCHIATRIC: The patient is alert and oriented x 3.  SKIN: No obvious rash, lesion, or ulcer.   DATA REVIEW:   CBC  Recent Labs Lab 01/16/17 0427  WBC 21.5*  HGB 11.3*  HCT 34.1*  PLT 379     Chemistries   Recent Labs Lab 01/14/17 0255  NA 141  K 3.8  CL 110  CO2 25  GLUCOSE 222*  BUN 13  CREATININE 0.75  CALCIUM 7.1*    Cardiac Enzymes No results for input(s): TROPONINI in the last 168 hours.  Microbiology Results  Results for orders placed or performed during the hospital encounter of 01/12/17  Urine culture     Status: Abnormal   Collection Time: 01/12/17  6:02 AM  Result Value Ref Range Status   Specimen Description URINE, RANDOM  Final   Special Requests NONE  Final   Culture (A)  Final    <10,000 COLONIES/mL INSIGNIFICANT GROWTH Performed at [facility], [place] [address], [place], [place] [address]    Report Status 01/14/2017 FINAL  Final  CSF culture     Status: None  Collection Time: 01/12/17  6:31 AM  Result Value Ref Range Status   Specimen Description CSF  Final   Special Requests Normal  Final   Gram Stain   Final     MANY GRAM POSITIVE COCCI IN PAIRS  MANY WBC CRITICAL RESULT CALLED TO, READ BACK BY AND VERIFIED WITH:  Lillia Mountain AT [id number] 01/12/17 SDR    Culture ABUNDANT STREPTOCOCCUS PNEUMONIAE  Final   Report Status 01/14/2017 FINAL  Final   Organism ID, Bacteria STREPTOCOCCUS PNEUMONIAE  Final      Susceptibility   Streptococcus pneumoniae - MIC*    ERYTHROMYCIN <=0.12 SENSITIVE Sensitive     LEVOFLOXACIN 0.5 SENSITIVE Sensitive     PENICILLIN <=0.06 SENSITIVE Sensitive     CEFTRIAXONE <=0.12 SENSITIVE Sensitive     * ABUNDANT STREPTOCOCCUS PNEUMONIAE  Culture, blood (routine x 2)     Status: Abnormal   Collection Time: 01/12/17  6:54 AM  Result Value Ref Range Status   Specimen Description BLOOD RIGHT ANTECUBITAL  Final   Special Requests BOTTLES DRAWN AEROBIC AND ANAEROBIC 4ML  Final   Culture  Setup Time   Final    GRAM POSITIVE COCCI IN BOTH AEROBIC AND ANAEROBIC BOTTLES CRITICAL RESULT CALLED TO, READ BACK BY AND VERIFIED WITH: NATE COOKSON AT 2125 ON 01/12/2017 JLJ Performed at [facility], [place] [address] [place], [place] [address]    Culture STREPTOCOCCUS PNEUMONIAE (A)  Final   Report Status 01/15/2017 FINAL  Final   Organism ID, Bacteria STREPTOCOCCUS PNEUMONIAE  Final      Susceptibility   Streptococcus pneumoniae - MIC*    ERYTHROMYCIN <=0.12 SENSITIVE Sensitive     LEVOFLOXACIN 0.5 SENSITIVE Sensitive     PENICILLIN <=0.06 SENSITIVE Sensitive     CEFTRIAXONE <=0.12 SENSITIVE Sensitive     * STREPTOCOCCUS PNEUMONIAE  Culture, blood (routine x 2)     Status: Abnormal   Collection Time: 01/12/17  6:54 AM  Result Value Ref Range Status   Specimen Description BLOOD L AC  Final   Special Requests BOTTLES DRAWN AEROBIC AND ANAEROBIC 10ML  Final   Culture  Setup Time   Final    GRAM POSITIVE COCCI IN BOTH AEROBIC AND ANAEROBIC BOTTLES CRITICAL VALUE NOTED.  VALUE IS CONSISTENT WITH PREVIOUSLY REPORTED AND CALLED VALUE.    Culture (A)  Final    STREPTOCOCCUS PNEUMONIAE SUSCEPTIBILITIES PERFORMED ON PREVIOUS CULTURE WITHIN THE LAST 5 DAYS. Performed at [facility], [place] [address], [place], [place] [address]    Report Status 01/15/2017 FINAL  Final  Blood Culture ID Panel (Reflexed)     Status: Abnormal   Collection Time: 01/12/17  6:54 AM  Result Value Ref Range Status   Enterococcus species NOT DETECTED NOT DETECTED Final   Listeria monocytogenes NOT DETECTED NOT DETECTED Final   Staphylococcus species NOT DETECTED NOT DETECTED Final   Staphylococcus aureus NOT DETECTED NOT DETECTED Final   Streptococcus species DETECTED (A) NOT DETECTED Final    Comment: CRITICAL RESULT CALLED TO, READ BACK BY AND VERIFIED WITH: NATE COOKSON AT 2125 ON 01/12/2017 JLJ    Streptococcus agalactiae NOT DETECTED NOT DETECTED Final   Streptococcus pneumoniae DETECTED (A) NOT DETECTED Final    Comment: CRITICAL RESULT CALLED TO, READ BACK BY AND VERIFIED WITH: NATE COOKSON AT 2125 ON 01/12/2017 JLJ    Streptococcus pyogenes NOT DETECTED NOT DETECTED Final   Acinetobacter baumannii NOT DETECTED  NOT DETECTED Final   Enterobacteriaceae species NOT DETECTED NOT DETECTED  Final   Enterobacter cloacae complex NOT DETECTED NOT DETECTED Final   Escherichia coli NOT DETECTED NOT DETECTED Final   Klebsiella oxytoca NOT DETECTED NOT DETECTED Final   Klebsiella pneumoniae NOT DETECTED NOT DETECTED Final   Proteus species NOT DETECTED NOT DETECTED Final   Serratia marcescens NOT DETECTED NOT DETECTED Final   Haemophilus influenzae NOT DETECTED NOT DETECTED Final   Neisseria meningitidis NOT DETECTED NOT DETECTED Final   Pseudomonas aeruginosa NOT DETECTED NOT DETECTED Final   Candida albicans NOT DETECTED NOT DETECTED Final   Candida glabrata NOT DETECTED NOT DETECTED Final   Candida krusei NOT DETECTED NOT DETECTED Final   Candida parapsilosis NOT DETECTED NOT DETECTED Final   Candida tropicalis NOT DETECTED NOT DETECTED Final    RADIOLOGY:  No results found.  Follow up with PCP in 1 week.  Management plans discussed with the patient, family and they are in agreement.  CODE STATUS:  Code Status History    Date Active Date Inactive Code Status Order ID Comments User Context   01/12/2017  7:41 AM 01/16/2017 10:02 PM Full Code [id number]  Bettey Costa, MD ED   04/11/2016  1:40 PM 04/12/2016  5:15 PM Full Code [id number]  Brayton Mars, MD Inpatient      TOTAL TIME TAKING CARE OF THIS PATIENT ON DAY OF DISCHARGE: more than 30 minutes.   Hillary Bow R M.D on 01/19/2017 at 3:08 PM  Between 7am to 6pm - Pager - [phone]  After 6pm go to [email or web address] - password [email or web address] [facility]  Office  [phone]  CC: Primary care physician; Lorelee Market, MD  Note: This dictation was prepared with Dragon dictation along with smaller phrase technology. Any transcriptional errors that result from this process are unintentional.

## 2017-01-20 DIAGNOSIS — Z792 Long term (current) use of antibiotics: Secondary | ICD-10-CM | POA: Insufficient documentation

## 2017-01-20 DIAGNOSIS — B953 Streptococcus pneumoniae as the cause of diseases classified elsewhere: Secondary | ICD-10-CM | POA: Insufficient documentation

## 2017-01-20 DIAGNOSIS — G039 Meningitis, unspecified: Secondary | ICD-10-CM

## 2017-01-20 DIAGNOSIS — R7881 Bacteremia: Secondary | ICD-10-CM | POA: Insufficient documentation

## 2017-01-20 DIAGNOSIS — G001 Pneumococcal meningitis: Secondary | ICD-10-CM | POA: Insufficient documentation

## 2017-01-20 DIAGNOSIS — Q8901 Asplenia (congenital): Secondary | ICD-10-CM | POA: Insufficient documentation

## 2017-01-20 HISTORY — DX: Meningitis, unspecified: G03.9

## 2017-01-27 ENCOUNTER — Ambulatory Visit
Admission: RE | Admit: 2017-01-27 | Discharge: 2017-01-27 | Disposition: A | Discharge status: Home / Self Care | Payer: Medicaid Other | Source: Ambulatory Visit | Attending: Infectious Diseases | Admitting: Infectious Diseases

## 2017-01-27 ENCOUNTER — Other Ambulatory Visit: Payer: Self-pay | Admitting: Infectious Diseases

## 2017-01-27 DIAGNOSIS — B958 Unspecified staphylococcus as the cause of diseases classified elsewhere: Secondary | ICD-10-CM

## 2017-01-27 DIAGNOSIS — G001 Pneumococcal meningitis: Secondary | ICD-10-CM | POA: Diagnosis not present

## 2017-01-27 DIAGNOSIS — R7881 Bacteremia: Secondary | ICD-10-CM | POA: Diagnosis present

## 2017-02-06 ENCOUNTER — Emergency Department: Payer: Medicaid Other

## 2017-02-06 ENCOUNTER — Emergency Department
Admission: EM | Admit: 2017-02-06 | Discharge: 2017-02-06 | Disposition: A | Discharge status: Home / Self Care | Payer: Medicaid Other | Attending: Emergency Medicine | Admitting: Emergency Medicine

## 2017-02-06 ENCOUNTER — Encounter: Payer: Self-pay | Admitting: Emergency Medicine

## 2017-02-06 DIAGNOSIS — Z7982 Long term (current) use of aspirin: Secondary | ICD-10-CM | POA: Diagnosis not present

## 2017-02-06 DIAGNOSIS — Z79899 Other long term (current) drug therapy: Secondary | ICD-10-CM | POA: Diagnosis not present

## 2017-02-06 DIAGNOSIS — J449 Chronic obstructive pulmonary disease, unspecified: Secondary | ICD-10-CM | POA: Insufficient documentation

## 2017-02-06 DIAGNOSIS — Z87891 Personal history of nicotine dependence: Secondary | ICD-10-CM | POA: Diagnosis not present

## 2017-02-06 DIAGNOSIS — M79671 Pain in right foot: Secondary | ICD-10-CM | POA: Diagnosis not present

## 2017-02-06 DIAGNOSIS — E039 Hypothyroidism, unspecified: Secondary | ICD-10-CM | POA: Diagnosis not present

## 2017-02-06 MED ORDER — MELOXICAM 15 MG PO TABS
15.0000 mg | ORAL_TABLET | Freq: Every day | ORAL | 2 refills | Status: DC
Start: 2017-02-06 — End: 2017-09-23

## 2017-02-06 NOTE — Discharge Instructions (Signed)
Taking meloxicam 15 mg daily with food. Follow-up with Dr. Vickki Muff if any continued problems with your foot. Follow-up with your primary care doctor if any continued problems or [facility] with Dr. Vickki Muff.
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
## 2017-02-06 NOTE — ED Notes (Addendum)
Right foot with pain x month. +2 pulses, foot warm. No obvious injury.

## 2017-02-06 NOTE — ED Triage Notes (Signed)
States large dog stepped on R foot 4 weeks ago. Has had other serious illness so had not had time to address foot. Now well from other problems and concerned foot pain continues.

## 2017-04-19 DIAGNOSIS — E039 Hypothyroidism, unspecified: Secondary | ICD-10-CM | POA: Insufficient documentation

## 2017-04-19 DIAGNOSIS — Z8619 Personal history of other infectious and parasitic diseases: Secondary | ICD-10-CM | POA: Insufficient documentation

## 2017-06-06 ENCOUNTER — Other Ambulatory Visit: Payer: Self-pay | Admitting: Obstetrics and Gynecology

## 2017-07-17 DIAGNOSIS — Z8661 Personal history of infections of the central nervous system: Secondary | ICD-10-CM | POA: Insufficient documentation

## 2017-09-11 DIAGNOSIS — G5603 Carpal tunnel syndrome, bilateral upper limbs: Secondary | ICD-10-CM | POA: Insufficient documentation

## 2017-09-12 ENCOUNTER — Emergency Department: Payer: Medicaid Other

## 2017-09-12 ENCOUNTER — Emergency Department
Admission: EM | Admit: 2017-09-12 | Discharge: 2017-09-12 | Disposition: A | Discharge status: Home / Self Care | Payer: Medicaid Other | Attending: Emergency Medicine | Admitting: Emergency Medicine

## 2017-09-12 DIAGNOSIS — Z7982 Long term (current) use of aspirin: Secondary | ICD-10-CM | POA: Insufficient documentation

## 2017-09-12 DIAGNOSIS — R51 Headache: Secondary | ICD-10-CM | POA: Insufficient documentation

## 2017-09-12 DIAGNOSIS — J329 Chronic sinusitis, unspecified: Secondary | ICD-10-CM | POA: Insufficient documentation

## 2017-09-12 DIAGNOSIS — Z79899 Other long term (current) drug therapy: Secondary | ICD-10-CM | POA: Insufficient documentation

## 2017-09-12 DIAGNOSIS — Z87891 Personal history of nicotine dependence: Secondary | ICD-10-CM | POA: Diagnosis not present

## 2017-09-12 DIAGNOSIS — E039 Hypothyroidism, unspecified: Secondary | ICD-10-CM | POA: Insufficient documentation

## 2017-09-12 DIAGNOSIS — R519 Headache, unspecified: Secondary | ICD-10-CM

## 2017-09-12 LAB — BASIC METABOLIC PANEL
Anion gap: 8 (ref 5–15)
BUN: 12 mg/dL (ref 6–20)
CHLORIDE: 104 mmol/L (ref 101–111)
CO2: 27 mmol/L (ref 22–32)
CREATININE: 0.82 mg/dL (ref 0.44–1.00)
Calcium: 9.4 mg/dL (ref 8.9–10.3)
GFR calc Af Amer: >60 mL/min (ref >60)
GFR calc non Af Amer: >60 mL/min (ref >60)
Glucose, Bld: 86 mg/dL (ref 65–99)
Potassium: 4 mmol/L (ref 3.5–5.1)
SODIUM: 139 mmol/L (ref 135–145)

## 2017-09-12 LAB — CBC WITH DIFFERENTIAL/PLATELET
Basophils Absolute: 0.2 10*3/uL — ABNORMAL HIGH (ref 0–0.1)
Basophils Relative: 1 %
EOS ABS: 0.4 10*3/uL (ref 0–0.7)
Eosinophils Relative: 3 %
HEMATOCRIT: 39.3 % (ref 35.0–47.0)
HEMOGLOBIN: 13.5 g/dL (ref 12.0–16.0)
LYMPHS ABS: 5.4 10*3/uL — AB (ref 1.0–3.6)
Lymphocytes Relative: 41 %
MCH: 30.6 pg (ref 26.0–34.0)
MCHC: 34.2 g/dL (ref 32.0–36.0)
MCV: 89.3 fL (ref 80.0–100.0)
MONO ABS: 0.6 10*3/uL (ref 0.2–0.9)
MONOS PCT: 5 %
NEUTROS PCT: 50 %
Neutro Abs: 6.7 10*3/uL — ABNORMAL HIGH (ref 1.4–6.5)
Platelets: 336 10*3/uL (ref 150–440)
RBC: 4.4 MIL/uL (ref 3.80–5.20)
RDW: 14.3 % (ref 11.5–14.5)
WBC: 13.3 10*3/uL — ABNORMAL HIGH (ref 3.6–11.0)

## 2017-09-12 LAB — LACTIC ACID, PLASMA: Lactic Acid, Venous: 1.1 mmol/L (ref 0.5–1.9)

## 2017-09-12 MED ORDER — FLUTICASONE PROPIONATE 50 MCG/ACT NA SUSP: 1.0000 | Freq: Every day | NASAL | 0 refills | Status: DC

## 2017-09-12 MED ORDER — LORATADINE 10 MG PO TABS: 10.0000 mg | ORAL_TABLET | Freq: Every day | ORAL | 2 refills | Status: DC

## 2017-09-12 MED ORDER — AMOXICILLIN-POT CLAVULANATE 875-125 MG PO TABS: 1.0000 | ORAL_TABLET | Freq: Two times a day (BID) | ORAL | 0 refills | Status: AC

## 2017-09-12 MED ORDER — LORAZEPAM 1 MG PO TABS
ORAL_TABLET | ORAL | Status: AC
  Administered 2017-09-12: 1 mg
  Filled 2017-09-12: qty 1

## 2017-09-12 MED ORDER — LIDOCAINE HCL (PF) 1 % IJ SOLN
INTRAMUSCULAR | Status: AC
  Administered 2017-09-12: 10 mL
  Filled 2017-09-12: qty 10

## 2017-09-12 MED ORDER — PROCHLORPERAZINE EDISYLATE 5 MG/ML IJ SOLN
INTRAMUSCULAR | Status: AC
  Administered 2017-09-12: 10 mg via INTRAVENOUS
  Filled 2017-09-12: qty 2

## 2017-09-12 MED ORDER — PROCHLORPERAZINE EDISYLATE 5 MG/ML IJ SOLN
10.0000 mg | Freq: Once | INTRAMUSCULAR | Status: AC
  Administered 2017-09-12: 10 mg via INTRAVENOUS

## 2017-09-12 MED ORDER — LIDOCAINE HCL (PF) 1 % IJ SOLN
10.0000 mL | Freq: Once | INTRAMUSCULAR | Status: AC
  Administered 2017-09-12: 10 mL

## 2017-09-12 NOTE — ED Notes (Signed)
Pt returned from CT via stretcher, wore mask while out of room.

## 2017-09-12 NOTE — ED Notes (Signed)
Pt made aware of delay in blood work. Pt verbalized that wait was ok. Pt given blanket.

## 2017-09-12 NOTE — ED Triage Notes (Addendum)
Pt states she has had neck pain that started yesterday followed by headache, fatigue, nausea. Pt hx of viral and bacterial meningitis in February. 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 Pt denies injury to neck, pain radiating towards top of head from neck.

## 2017-09-12 NOTE — ED Notes (Signed)
Pt states top of head and posterior headache. States it starts posterior and wraps around and "it feels like it's going into my brain." pt also c/o neck pain but states different from her chronic neck pain from a spinal fusion.

## 2017-09-12 NOTE — ED Notes (Signed)
Dr. Archie Balboa and this RN at bedside to perform LP. Consent signed by pt.
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
## 2017-09-12 NOTE — ED Notes (Signed)
Pt placed on droplet precautions

## 2017-09-12 NOTE — ED Notes (Addendum)
FIRST NURSE NOTE-c/o back/neck pain like when had meningitis a few months ago. Mask given. NAD, ambulatory. No fever. Pain relieved with flexion/extension of neck.

## 2017-09-12 NOTE — Discharge Instructions (Signed)
Please seek medical attention for any high fevers, chest pain, shortness of breath, change in behavior, persistent vomiting, bloody stool or any other new or concerning symptoms.  

## 2017-09-12 NOTE — ED Notes (Signed)
Pt ambulatory to toilet by self.  
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
## 2017-09-17 LAB — CULTURE, BLOOD (ROUTINE X 2)
Culture: NO GROWTH
Culture: NO GROWTH

## 2017-09-23 ENCOUNTER — Emergency Department
Admission: EM | Admit: 2017-09-23 | Discharge: 2017-09-23 | Disposition: A | Discharge status: Home / Self Care | Payer: Medicaid Other | Attending: Emergency Medicine | Admitting: Emergency Medicine

## 2017-09-23 ENCOUNTER — Encounter: Payer: Self-pay | Admitting: Emergency Medicine

## 2017-09-23 ENCOUNTER — Emergency Department: Payer: Medicaid Other

## 2017-09-23 DIAGNOSIS — E039 Hypothyroidism, unspecified: Secondary | ICD-10-CM | POA: Diagnosis not present

## 2017-09-23 DIAGNOSIS — W01198A Fall on same level from slipping, tripping and stumbling with subsequent striking against other object, initial encounter: Secondary | ICD-10-CM | POA: Diagnosis not present

## 2017-09-23 DIAGNOSIS — Z7982 Long term (current) use of aspirin: Secondary | ICD-10-CM | POA: Insufficient documentation

## 2017-09-23 DIAGNOSIS — J449 Chronic obstructive pulmonary disease, unspecified: Secondary | ICD-10-CM | POA: Insufficient documentation

## 2017-09-23 DIAGNOSIS — Y999 Unspecified external cause status: Secondary | ICD-10-CM | POA: Diagnosis not present

## 2017-09-23 DIAGNOSIS — F17210 Nicotine dependence, cigarettes, uncomplicated: Secondary | ICD-10-CM | POA: Insufficient documentation

## 2017-09-23 DIAGNOSIS — Y929 Unspecified place or not applicable: Secondary | ICD-10-CM | POA: Insufficient documentation

## 2017-09-23 DIAGNOSIS — S56302A Unspecified injury of extensor or abductor muscles, fascia and tendons of left thumb at forearm level, initial encounter: Secondary | ICD-10-CM | POA: Diagnosis not present

## 2017-09-23 DIAGNOSIS — S6992XA Unspecified injury of left wrist, hand and finger(s), initial encounter: Secondary | ICD-10-CM | POA: Diagnosis present

## 2017-09-23 DIAGNOSIS — Z79899 Other long term (current) drug therapy: Secondary | ICD-10-CM | POA: Insufficient documentation

## 2017-09-23 DIAGNOSIS — Y939 Activity, unspecified: Secondary | ICD-10-CM | POA: Diagnosis not present

## 2017-09-23 NOTE — ED Triage Notes (Signed)
Injured L thumb in fall one week ago, increasing pain.
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
## 2017-09-23 NOTE — ED Notes (Signed)
Pt to ed with c/o left thumb pain after falling about 1 week ago.  Pt reports pain with movement.

## 2017-09-25 HISTORY — PX: CARPAL TUNNEL RELEASE: SHX101

## 2017-10-08 ENCOUNTER — Emergency Department
Admission: EM | Admit: 2017-10-08 | Discharge: 2017-10-08 | Disposition: A | Discharge status: Home / Self Care | Payer: Medicaid Other | Attending: Emergency Medicine | Admitting: Emergency Medicine

## 2017-10-08 ENCOUNTER — Encounter: Payer: Self-pay | Admitting: *Deleted

## 2017-10-08 ENCOUNTER — Emergency Department: Payer: Medicaid Other

## 2017-10-08 DIAGNOSIS — J449 Chronic obstructive pulmonary disease, unspecified: Secondary | ICD-10-CM | POA: Insufficient documentation

## 2017-10-08 DIAGNOSIS — F17210 Nicotine dependence, cigarettes, uncomplicated: Secondary | ICD-10-CM | POA: Diagnosis not present

## 2017-10-08 DIAGNOSIS — G510 Bell's palsy: Secondary | ICD-10-CM | POA: Insufficient documentation

## 2017-10-08 DIAGNOSIS — Z79899 Other long term (current) drug therapy: Secondary | ICD-10-CM | POA: Diagnosis not present

## 2017-10-08 DIAGNOSIS — R200 Anesthesia of skin: Secondary | ICD-10-CM | POA: Diagnosis present

## 2017-10-08 LAB — CBC
HCT: 41.3 % (ref 35.0–47.0)
HEMOGLOBIN: 14 g/dL (ref 12.0–16.0)
MCH: 30.4 pg (ref 26.0–34.0)
MCHC: 34 g/dL (ref 32.0–36.0)
MCV: 89.5 fL (ref 80.0–100.0)
Platelets: 342 10*3/uL (ref 150–440)
RBC: 4.62 MIL/uL (ref 3.80–5.20)
RDW: 13.7 % (ref 11.5–14.5)
WBC: 14.2 10*3/uL — AB (ref 3.6–11.0)

## 2017-10-08 LAB — BASIC METABOLIC PANEL
ANION GAP: 11 (ref 5–15)
BUN: 16 mg/dL (ref 6–20)
CALCIUM: 9.4 mg/dL (ref 8.9–10.3)
CO2: 23 mmol/L (ref 22–32)
Chloride: 103 mmol/L (ref 101–111)
Creatinine, Ser: 0.82 mg/dL (ref 0.44–1.00)
Glucose, Bld: 115 mg/dL — ABNORMAL HIGH (ref 65–99)
Potassium: 4 mmol/L (ref 3.5–5.1)
Sodium: 137 mmol/L (ref 135–145)

## 2017-10-08 MED ORDER — PREDNISONE 20 MG PO TABS: 60.0000 mg | ORAL_TABLET | Freq: Every day | ORAL | 0 refills | Status: AC

## 2017-10-08 MED ORDER — ARTIFICIAL TEARS OPHTHALMIC OINT: TOPICAL_OINTMENT | Freq: Every evening | OPHTHALMIC | 0 refills | Status: DC | PRN

## 2017-10-08 MED ORDER — ACETAMINOPHEN 325 MG PO TABS
ORAL_TABLET | ORAL | Status: AC
  Administered 2017-10-08: 650 mg via ORAL
  Filled 2017-10-08: qty 2

## 2017-10-08 MED ORDER — ACETAMINOPHEN 325 MG PO TABS
650.0000 mg | ORAL_TABLET | Freq: Once | ORAL | Status: AC
  Administered 2017-10-08: 650 mg via ORAL

## 2017-10-08 MED ORDER — KETOROLAC TROMETHAMINE 30 MG/ML IJ SOLN
30.0000 mg | Freq: Once | INTRAMUSCULAR | Status: AC
  Administered 2017-10-08: 30 mg via INTRAVENOUS
  Filled 2017-10-08: qty 1
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
## 2017-10-08 NOTE — ED Triage Notes (Signed)
Pt to ED reporting numbness in right side of face that began at 7:30 this morning but no other weakness. Pt has right sided facial droop. Pt reports she had sudden onset of a headache yesterday at 7:30 but no numbness at this time. While walking back to treatment room pt reports right eye was having changes in vision and right arm "felt like there was a rubber band on it" and was cramping. Pt is alert and oriented x 4. Pt is tearful and anxious at this time.   Pt reports she takes 81 mg aspirin but denies other blood thinner use. Hx of meningitis this February.

## 2017-10-11 DIAGNOSIS — E669 Obesity, unspecified: Secondary | ICD-10-CM | POA: Insufficient documentation

## 2017-11-15 ENCOUNTER — Inpatient Hospital Stay
Admission: RE | Admit: 2017-11-15 | Discharge: 2017-11-15 | Disposition: A | Discharge status: Home / Self Care | Payer: Self-pay | Source: Ambulatory Visit

## 2017-11-15 ENCOUNTER — Other Ambulatory Visit: Payer: Self-pay

## 2017-11-21 ENCOUNTER — Encounter
Admission: RE | Admit: 2017-11-21 | Discharge: 2017-11-21 | Disposition: A | Discharge status: Home / Self Care | Payer: Medicaid Other | Source: Ambulatory Visit | Attending: Unknown Physician Specialty | Admitting: Unknown Physician Specialty

## 2017-11-21 ENCOUNTER — Other Ambulatory Visit: Payer: Self-pay

## 2017-11-21 DIAGNOSIS — Z01810 Encounter for preprocedural cardiovascular examination: Secondary | ICD-10-CM | POA: Diagnosis not present

## 2017-11-21 DIAGNOSIS — R011 Cardiac murmur, unspecified: Secondary | ICD-10-CM | POA: Diagnosis not present

## 2017-11-21 HISTORY — DX: Bell's palsy: G51.0

## 2017-11-21 NOTE — Patient Instructions (Signed)
Your procedure is scheduled on: 11/22/17 Report to Day Surgery. To find out your arrival time please call [phone] between 1PM - 3PM on today.  Remember: Instructions that are not followed completely may result in serious medical risk, up to and including death, or upon the discretion of your surgeon and anesthesiologist your surgery may need to be rescheduled.     _X__ 1. Do not eat food after midnight the night before your procedure.                 No gum chewing or hard candies. You may drink clear liquids up to 2 hours                 before you are scheduled to arrive for your surgery- DO not drink clear                 liquids within 2 hours of the start of your surgery.                 Clear Liquids include:  water, apple juice without pulp, clear carbohydrate                 drink such as Clearfast of [place], Black Coffee or Tea (Do not add                 anything to coffee or tea).     _X__ 2.  No Alcohol for 24 hours before or after surgery.   _X__ 3.  Do Not Smoke or use e-cigarettes For 24 Hours Prior to Your Surgery.                 Do not use any chewable tobacco products for at least 6 hours prior to                 surgery.  ____  4.  Bring all medications with you on the day of surgery if instructed.   __x__  5.  Notify your doctor if there is any change in your medical condition      (cold, fever, infections).     Do not wear jewelry, make-up, hairpins, clips or nail polish. Do not wear lotions, powders, or perfumes. You may wear deodorant. Do not shave 48 hours prior to surgery. Men may shave face and neck. Do not bring valuables to the hospital.    [facility] is not responsible for any belongings or valuables.  Contacts, dentures or bridgework may not be worn into surgery. Leave your suitcase in the car. After surgery it may be brought to your room. For patients admitted to the hospital, discharge time is determined by  your treatment team.   Patients discharged the day of surgery will not be allowed to drive home.   Please read over the following fact sheets that you were given:    __x__ Take these medicines the morning of surgery with A SIP OF WATER:    1. buPROPion (WELLBUTRIN XL) 150 MG 24 hr tablet  2. cetirizine (ZYRTEC) 10 MG tablet  3. fluticasone (FLONASE) 50 MCG/ACT nasal spray  4.oxyCODONE-acetaminophen (PERCOCET/ROXICET) 5-325 MG tablet if needed  5.gabapentin (NEURONTIN) 100 MG capsule if needed  6.simvastatin (ZOCOR) 20 MG tablet              7.ranitidine (ZANTAC) 150 MG capsule night before and morning of surgery  ____ Fleet Enema (as directed)   _x___ Use CHG Soap as directed  __x__ Use inhalers  on the day of surgerytiotropium (SPIRIVA HANDIHALER) 18 MCG inhalation capsule,Fluticasone-Salmeterol (ADVAIR DISKUS) 250-50, albuterol (PROAIR HFA) 108 (90 BASE) MCG/ACT inhaler MCG/DOSE AEPB and bring to hospital  ____ Stop metformin 2 days prior to surgery    ____ Take 1/2 of usual insulin dose the night before surgery. No insulin the morning          of surgery.   __x__ Stop aspirin on today  __x__ Stop Anti-inflammatories on meloxicam (MOBIC) 15 MG tablet today   ____ Stop supplements until after surgery.    ____ Bring C-Pap to the hospital.

## 2017-11-22 ENCOUNTER — Ambulatory Visit: Payer: Medicaid Other | Admitting: Anesthesiology

## 2017-11-22 ENCOUNTER — Encounter
Admission: RE | Disposition: A | Discharge status: Home / Self Care | Payer: Self-pay | Source: Ambulatory Visit | Attending: Unknown Physician Specialty

## 2017-11-22 ENCOUNTER — Ambulatory Visit
Admission: RE | Admit: 2017-11-22 | Discharge: 2017-11-22 | Disposition: A | Discharge status: Home / Self Care | Payer: Medicaid Other | Source: Ambulatory Visit | Attending: Unknown Physician Specialty | Admitting: Unknown Physician Specialty

## 2017-11-22 DIAGNOSIS — E039 Hypothyroidism, unspecified: Secondary | ICD-10-CM | POA: Insufficient documentation

## 2017-11-22 DIAGNOSIS — E669 Obesity, unspecified: Secondary | ICD-10-CM | POA: Diagnosis not present

## 2017-11-22 DIAGNOSIS — F329 Major depressive disorder, single episode, unspecified: Secondary | ICD-10-CM | POA: Insufficient documentation

## 2017-11-22 DIAGNOSIS — F17200 Nicotine dependence, unspecified, uncomplicated: Secondary | ICD-10-CM | POA: Insufficient documentation

## 2017-11-22 DIAGNOSIS — Z6831 Body mass index (BMI) 31.0-31.9, adult: Secondary | ICD-10-CM | POA: Diagnosis not present

## 2017-11-22 DIAGNOSIS — E785 Hyperlipidemia, unspecified: Secondary | ICD-10-CM | POA: Insufficient documentation

## 2017-11-22 DIAGNOSIS — Z79899 Other long term (current) drug therapy: Secondary | ICD-10-CM | POA: Diagnosis not present

## 2017-11-22 DIAGNOSIS — Z7982 Long term (current) use of aspirin: Secondary | ICD-10-CM | POA: Diagnosis not present

## 2017-11-22 DIAGNOSIS — J449 Chronic obstructive pulmonary disease, unspecified: Secondary | ICD-10-CM | POA: Diagnosis not present

## 2017-11-22 DIAGNOSIS — K219 Gastro-esophageal reflux disease without esophagitis: Secondary | ICD-10-CM | POA: Insufficient documentation

## 2017-11-22 DIAGNOSIS — G5603 Carpal tunnel syndrome, bilateral upper limbs: Secondary | ICD-10-CM | POA: Diagnosis not present

## 2017-11-22 HISTORY — PX: CARPAL TUNNEL RELEASE: SHX101

## 2017-11-22 SURGERY — CARPAL TUNNEL RELEASE
Anesthesia: General | Site: Arm Lower | Laterality: Right | Wound class: Clean

## 2017-11-22 MED ORDER — LIDOCAINE HCL (PF) 2 % IJ SOLN
INTRAMUSCULAR | Status: AC
  Filled 2017-11-22: qty 10

## 2017-11-22 MED ORDER — ONDANSETRON HCL 4 MG/2ML IJ SOLN: 4.0000 mg | Freq: Once | INTRAMUSCULAR | Status: DC | PRN

## 2017-11-22 MED ORDER — MIDAZOLAM HCL 2 MG/2ML IJ SOLN
INTRAMUSCULAR | Status: AC
  Filled 2017-11-22: qty 2

## 2017-11-22 MED ORDER — BUPIVACAINE HCL (PF) 0.5 % IJ SOLN
INTRAMUSCULAR | Status: DC | PRN
  Administered 2017-11-22: 5 mL

## 2017-11-22 MED ORDER — ONDANSETRON HCL 4 MG/2ML IJ SOLN
INTRAMUSCULAR | Status: AC
  Filled 2017-11-22: qty 2

## 2017-11-22 MED ORDER — MIDAZOLAM HCL 2 MG/2ML IJ SOLN
INTRAMUSCULAR | Status: DC | PRN
  Administered 2017-11-22: 2 mg via INTRAVENOUS

## 2017-11-22 MED ORDER — FENTANYL CITRATE (PF) 100 MCG/2ML IJ SOLN
INTRAMUSCULAR | Status: AC
  Filled 2017-11-22: qty 2

## 2017-11-22 MED ORDER — KETOROLAC TROMETHAMINE 30 MG/ML IJ SOLN
INTRAMUSCULAR | Status: AC
  Filled 2017-11-22: qty 1

## 2017-11-22 MED ORDER — GLYCOPYRROLATE 0.2 MG/ML IJ SOLN
INTRAMUSCULAR | Status: AC
  Filled 2017-11-22: qty 1

## 2017-11-22 MED ORDER — FENTANYL CITRATE (PF) 100 MCG/2ML IJ SOLN
25.0000 ug | INTRAMUSCULAR | Status: DC | PRN
  Administered 2017-11-22 (×4): 25 ug via INTRAVENOUS

## 2017-11-22 MED ORDER — PROPOFOL 10 MG/ML IV BOLUS
INTRAVENOUS | Status: DC | PRN
  Administered 2017-11-22: 180 mg via INTRAVENOUS
  Administered 2017-11-22: 20 mg via INTRAVENOUS

## 2017-11-22 MED ORDER — LACTATED RINGERS IV SOLN
INTRAVENOUS | Status: DC
  Administered 2017-11-22: 06:00:00 via INTRAVENOUS

## 2017-11-22 MED ORDER — BUPIVACAINE HCL (PF) 0.5 % IJ SOLN
INTRAMUSCULAR | Status: AC
  Filled 2017-11-22: qty 30

## 2017-11-22 MED ORDER — LIDOCAINE HCL (CARDIAC) 20 MG/ML IV SOLN
INTRAVENOUS | Status: DC | PRN
  Administered 2017-11-22: 60 mg via INTRAVENOUS

## 2017-11-22 MED ORDER — KETOROLAC TROMETHAMINE 30 MG/ML IJ SOLN
INTRAMUSCULAR | Status: DC | PRN
  Administered 2017-11-22: 30 mg via INTRAVENOUS

## 2017-11-22 MED ORDER — FENTANYL CITRATE (PF) 100 MCG/2ML IJ SOLN
INTRAMUSCULAR | Status: AC
  Administered 2017-11-22: 25 ug via INTRAVENOUS
  Filled 2017-11-22: qty 2

## 2017-11-22 MED ORDER — FENTANYL CITRATE (PF) 100 MCG/2ML IJ SOLN
INTRAMUSCULAR | Status: DC | PRN
  Administered 2017-11-22 (×4): 25 ug via INTRAVENOUS

## 2017-11-22 MED ORDER — ONDANSETRON HCL 4 MG/2ML IJ SOLN
INTRAMUSCULAR | Status: DC | PRN
  Administered 2017-11-22: 4 mg via INTRAVENOUS

## 2017-11-22 MED ORDER — DEXAMETHASONE SODIUM PHOSPHATE 10 MG/ML IJ SOLN
INTRAMUSCULAR | Status: AC
  Filled 2017-11-22: qty 1

## 2017-11-22 MED ORDER — PROPOFOL 10 MG/ML IV BOLUS
INTRAVENOUS | Status: AC
  Filled 2017-11-22: qty 20

## 2017-11-22 MED ORDER — DEXAMETHASONE SODIUM PHOSPHATE 10 MG/ML IJ SOLN
INTRAMUSCULAR | Status: DC | PRN
  Administered 2017-11-22: 10 mg via INTRAVENOUS

## 2017-11-22 MED ORDER — GLYCOPYRROLATE 0.2 MG/ML IJ SOLN
INTRAMUSCULAR | Status: DC | PRN
  Administered 2017-11-22: 0.2 mg via INTRAVENOUS

## 2017-11-22 SURGICAL SUPPLY — 25 items
BANDAGE ELASTIC 2 LF NS (GAUZE/BANDAGES/DRESSINGS) ×3 IMPLANT
BNDG ESMARK 4X12 TAN STRL LF (GAUZE/BANDAGES/DRESSINGS) ×3 IMPLANT
CHLORAPREP W/TINT 26ML (MISCELLANEOUS) ×3 IMPLANT
CUFF TOURN 18 STER (MISCELLANEOUS) ×3 IMPLANT
ELECT REM PT RETURN 9FT ADLT (ELECTROSURGICAL) ×3
ELECTRODE REM PT RTRN 9FT ADLT (ELECTROSURGICAL) ×1 IMPLANT
GAUZE SPONGE 4X4 12PLY STRL (GAUZE/BANDAGES/DRESSINGS) ×3 IMPLANT
GLOVE BIO SURGEON STRL SZ7.5 (GLOVE) ×3 IMPLANT
GLOVE BIO SURGEON STRL SZ8 (GLOVE) ×6 IMPLANT
GLOVE BIOGEL M STRL SZ7.5 (GLOVE) ×3 IMPLANT
GLOVE INDICATOR 8.0 STRL GRN (GLOVE) ×3 IMPLANT
GOWN STRL REUS W/ TWL LRG LVL3 (GOWN DISPOSABLE) ×2 IMPLANT
GOWN STRL REUS W/TWL LRG LVL3 (GOWN DISPOSABLE) ×4
KIT RM TURNOVER STRD PROC AR (KITS) ×3 IMPLANT
NS IRRIG 500ML POUR BTL (IV SOLUTION) ×3 IMPLANT
PACK EXTREMITY ARMC (MISCELLANEOUS) ×3 IMPLANT
PADDING CAST 2X4YD ST (MISCELLANEOUS) ×2
PADDING CAST BLEND 2X4 STRL (MISCELLANEOUS) ×1 IMPLANT
SOL PREP PVP 2OZ (MISCELLANEOUS) ×3
SOLUTION PREP PVP 2OZ (MISCELLANEOUS) ×1 IMPLANT
SPLINT CAST 1 STEP 3X12 (MISCELLANEOUS) ×3 IMPLANT
STOCKINETTE STRL 4IN 9604848 (GAUZE/BANDAGES/DRESSINGS) ×3 IMPLANT
SUT ETHILON 4-0 (SUTURE) ×2
SUT ETHILON 4-0 FS2 18XMFL BLK (SUTURE) ×1
SUTURE ETHLN 4-0 FS2 18XMF BLK (SUTURE) ×1 IMPLANT

## 2017-11-22 NOTE — Progress Notes (Signed)
Capillary refill positive to right upper extremity  Can wiggle fingers

## 2017-11-22 NOTE — Discharge Instructions (Signed)
Ice pack ° °Elevation ° °RTC in about 2 weeks °

## 2017-11-22 NOTE — Transfer of Care (Signed)
Immediate Anesthesia Transfer of Care Note  Patient: Monica Terry  Procedure(s) Performed: CARPAL TUNNEL RELEASE (Right Arm Lower)  Patient Location: PACU  Anesthesia Type:General  Level of Consciousness: awake and alert   Airway & Oxygen Therapy: Patient Spontanous Breathing and Patient connected to nasal cannula oxygen  Post-op Assessment: Report given to RN and Post -op Vital signs reviewed and stable  Post vital signs: Reviewed and stable  Last Vitals:  Vitals:   11/22/17 0624 11/22/17 0826  BP: 125/75 117/69  Pulse: 81 96  Resp: 18 17  Temp: (!) 36.3 C 36.7 C  SpO2: 97% 100%    Last Pain:  Vitals:   11/22/17 0624  TempSrc: Tympanic         Complications: No apparent anesthesia complications
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
## 2017-11-22 NOTE — Op Note (Signed)
DATE OF SURGERY:  11/22/2017  PATIENT NAME:  Monica Terry   DOB: 03-17-1966  MRN: [id number]  PRE-OPERATIVE DIAGNOSIS: Right carpal tunnel syndrome  POST-OPERATIVE DIAGNOSIS:  Same  PROCEDURE: Right carpal tunnel release  SURGEON: Dr. Leanor Kail, Brooke Bonito. M.D.  ANESTHESIA: Gen.   INDICATIONS FOR SURGERY: Monica Terry is a 51 y.o. year old female with a long history of numbness and paresthesias in the right hand. Nerve conduction studies demonstrated findings consistent with moderate  median nerve compression.The patient had not seen any significant improvement despite conservative nonsurgical intervention. After discussion of the risks and benefits of surgical intervention, the patient expressed understanding of the risks benefits and agreed with plans for carpal tunnel release.   PROCEDURE IN DETAIL: The patient was taken the operating room where satisfactory general anesthesia was achieved. A tourniquet was placed on the patient's right upper arm.The right hand and arm were prepped  and draped in the usual sterile fashion. A "time-out" was performed as per usual protocol. The hand and forearm were exsanguinated using an Esmarch and the tourniquet was inflated to 250 mmHg.  An incision was made just ulnar to the thenar palmar crease. Dissection was carried down through the palmar fascia to the transverse carpal ligament. The transverse carpal ligament was sharply incised, taking care to protect the underlying structures within the carpal tunnel. Complete release of the transverse carpal ligament was achieved. There was no evidence of a mass or proliferative synovitis within the carpal tunnel. The median nerve did appear to be slightly flattened. The wound was irrigated with saline. The tourniquet was released at this time. It had been up for about 9 minutes. Bleeding was controlled with digital pressure and coagulation cautery. I did inject the subcutaneous tissue of the wound with about 5  cc of 0.5% Marcaine without epinephrine. The skin was then re-approximated with interrupted sutures of #4-0 nylon. A sterile dressing was applied followed by application of a volar splint.  The patient was awakened and transferred to a stretcher bed.  The patient tolerated the procedure well and was transported to the PACU in stable condition. Blood loss was negligible.  Dr. Mariel Kansky. M.D.

## 2017-11-22 NOTE — Anesthesia Postprocedure Evaluation (Signed)
Anesthesia Post Note  Patient: Monica Terry  Procedure(s) Performed: CARPAL TUNNEL RELEASE (Right Arm Lower)  Patient location during evaluation: PACU Anesthesia Type: General Level of consciousness: awake and alert Pain management: pain level controlled Vital Signs Assessment: post-procedure vital signs reviewed and stable Respiratory status: spontaneous breathing, nonlabored ventilation, respiratory function stable and patient connected to nasal cannula oxygen Cardiovascular status: blood pressure returned to baseline and stable Postop Assessment: no apparent nausea or vomiting Anesthetic complications: no     Last Vitals:  Vitals:   11/22/17 0902 11/22/17 0931  BP: 125/81 130/72  Pulse: 83 79  Resp: 16   Temp: (!) 36.2 C   SpO2: 92% 93%    Last Pain:  Vitals:   11/22/17 0931  TempSrc:   PainSc: [place]

## 2017-11-22 NOTE — Anesthesia Procedure Notes (Signed)
Procedure Name: LMA Insertion Date/Time: 11/22/2017 7:42 AM Performed by: Eben Burow, CRNA Pre-anesthesia Checklist: Patient identified, Emergency Drugs available, Suction available, Patient being monitored and Timeout performed Patient Re-evaluated:Patient Re-evaluated prior to induction Oxygen Delivery Method: Circle system utilized Preoxygenation: Pre-oxygenation with 100% oxygen Induction Type: IV induction LMA: LMA inserted LMA Size: 4.0 Number of attempts: 1 Placement Confirmation: positive ETCO2 and breath sounds checked- equal and bilateral Tube secured with: Tape Dental Injury: Teeth and Oropharynx as per pre-operative assessment  Comments: Patient noted to have fever blister left lower lip

## 2017-11-22 NOTE — H&P (Signed)
  H and P reviewed. No changes. Uploaded at later date. 

## 2017-11-22 NOTE — Anesthesia Post-op Follow-up Note (Signed)
Anesthesia QCDR form completed.        

## 2018-01-08 DIAGNOSIS — G4440 Drug-induced headache, not elsewhere classified, not intractable: Secondary | ICD-10-CM | POA: Insufficient documentation

## 2018-01-08 DIAGNOSIS — G44221 Chronic tension-type headache, intractable: Secondary | ICD-10-CM | POA: Insufficient documentation

## 2018-01-15 DIAGNOSIS — R4189 Other symptoms and signs involving cognitive functions and awareness: Secondary | ICD-10-CM | POA: Insufficient documentation

## 2018-01-29 DIAGNOSIS — G479 Sleep disorder, unspecified: Secondary | ICD-10-CM | POA: Insufficient documentation

## 2018-02-08 ENCOUNTER — Ambulatory Visit: Payer: Medicaid Other | Attending: Nurse Practitioner | Admitting: Nurse Practitioner

## 2018-02-08 ENCOUNTER — Other Ambulatory Visit: Payer: Self-pay

## 2018-02-08 ENCOUNTER — Encounter: Payer: Self-pay | Admitting: Nurse Practitioner

## 2018-02-08 VITALS — BP 138/72 | HR 74 | Temp 97.6°F | Resp 18 | Ht 68.0 in | Wt 240.0 lb

## 2018-02-08 DIAGNOSIS — Z7982 Long term (current) use of aspirin: Secondary | ICD-10-CM | POA: Diagnosis not present

## 2018-02-08 DIAGNOSIS — Z87442 Personal history of urinary calculi: Secondary | ICD-10-CM | POA: Insufficient documentation

## 2018-02-08 DIAGNOSIS — Z87891 Personal history of nicotine dependence: Secondary | ICD-10-CM | POA: Diagnosis not present

## 2018-02-08 DIAGNOSIS — R1031 Right lower quadrant pain: Secondary | ICD-10-CM | POA: Diagnosis not present

## 2018-02-08 DIAGNOSIS — M542 Cervicalgia: Secondary | ICD-10-CM | POA: Diagnosis not present

## 2018-02-08 DIAGNOSIS — M1990 Unspecified osteoarthritis, unspecified site: Secondary | ICD-10-CM | POA: Insufficient documentation

## 2018-02-08 DIAGNOSIS — Z981 Arthrodesis status: Secondary | ICD-10-CM | POA: Insufficient documentation

## 2018-02-08 DIAGNOSIS — G510 Bell's palsy: Secondary | ICD-10-CM | POA: Diagnosis not present

## 2018-02-08 DIAGNOSIS — Z794 Long term (current) use of insulin: Secondary | ICD-10-CM | POA: Diagnosis not present

## 2018-02-08 DIAGNOSIS — M533 Sacrococcygeal disorders, not elsewhere classified: Secondary | ICD-10-CM | POA: Diagnosis not present

## 2018-02-08 DIAGNOSIS — M25551 Pain in right hip: Secondary | ICD-10-CM | POA: Diagnosis not present

## 2018-02-08 DIAGNOSIS — Z79890 Hormone replacement therapy (postmenopausal): Secondary | ICD-10-CM | POA: Diagnosis not present

## 2018-02-08 DIAGNOSIS — Z79899 Other long term (current) drug therapy: Secondary | ICD-10-CM

## 2018-02-08 DIAGNOSIS — M25562 Pain in left knee: Secondary | ICD-10-CM | POA: Diagnosis not present

## 2018-02-08 DIAGNOSIS — Z90710 Acquired absence of both cervix and uterus: Secondary | ICD-10-CM | POA: Insufficient documentation

## 2018-02-08 DIAGNOSIS — Z789 Other specified health status: Secondary | ICD-10-CM

## 2018-02-08 DIAGNOSIS — E039 Hypothyroidism, unspecified: Secondary | ICD-10-CM | POA: Diagnosis not present

## 2018-02-08 DIAGNOSIS — D649 Anemia, unspecified: Secondary | ICD-10-CM | POA: Insufficient documentation

## 2018-02-08 DIAGNOSIS — M25511 Pain in right shoulder: Secondary | ICD-10-CM | POA: Diagnosis not present

## 2018-02-08 DIAGNOSIS — J449 Chronic obstructive pulmonary disease, unspecified: Secondary | ICD-10-CM | POA: Diagnosis not present

## 2018-02-08 DIAGNOSIS — E7800 Pure hypercholesterolemia, unspecified: Secondary | ICD-10-CM | POA: Insufficient documentation

## 2018-02-08 DIAGNOSIS — Z8049 Family history of malignant neoplasm of other genital organs: Secondary | ICD-10-CM | POA: Insufficient documentation

## 2018-02-08 DIAGNOSIS — G894 Chronic pain syndrome: Secondary | ICD-10-CM | POA: Diagnosis present

## 2018-02-08 DIAGNOSIS — G8929 Other chronic pain: Secondary | ICD-10-CM | POA: Diagnosis not present

## 2018-02-08 DIAGNOSIS — Z833 Family history of diabetes mellitus: Secondary | ICD-10-CM | POA: Insufficient documentation

## 2018-02-08 DIAGNOSIS — Z79891 Long term (current) use of opiate analgesic: Secondary | ICD-10-CM

## 2018-02-08 DIAGNOSIS — F419 Anxiety disorder, unspecified: Secondary | ICD-10-CM | POA: Insufficient documentation

## 2018-02-08 DIAGNOSIS — Z800 Family history of malignant neoplasm of digestive organs: Secondary | ICD-10-CM | POA: Insufficient documentation

## 2018-02-08 DIAGNOSIS — M25561 Pain in right knee: Secondary | ICD-10-CM | POA: Diagnosis not present

## 2018-02-08 DIAGNOSIS — Z8249 Family history of ischemic heart disease and other diseases of the circulatory system: Secondary | ICD-10-CM | POA: Insufficient documentation

## 2018-02-08 DIAGNOSIS — Z7951 Long term (current) use of inhaled steroids: Secondary | ICD-10-CM | POA: Diagnosis not present

## 2018-02-08 DIAGNOSIS — M899 Disorder of bone, unspecified: Secondary | ICD-10-CM | POA: Diagnosis not present

## 2018-02-08 DIAGNOSIS — Z9889 Other specified postprocedural states: Secondary | ICD-10-CM | POA: Insufficient documentation

## 2018-02-08 DIAGNOSIS — Z90722 Acquired absence of ovaries, bilateral: Secondary | ICD-10-CM | POA: Insufficient documentation

## 2018-02-08 NOTE — Progress Notes (Signed)
Safety precautions to be maintained throughout the outpatient stay will include: orient to surroundings, keep bed in low position, maintain call bell within reach at all times, provide assistance with transfer out of bed and ambulation.  

## 2018-02-08 NOTE — Patient Instructions (Addendum)

## 2018-02-08 NOTE — Progress Notes (Signed)
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 obesity (Class II) Increased incidence by 136%  >40 kg/m2 Extreme obesity (Class III) Increased incidence by 254%   BMI Readings from Last 4 Encounters:  02/08/18 36.49 kg/m  11/22/17 31.02 kg/m  11/21/17 31.02 kg/m  10/08/17 34.97 kg/m   Wt Readings from Last 4 Encounters:  02/08/18 240 lb (108.9 kg)  11/22/17 204 lb (92.5 kg)  11/21/17 204 lb (92.5 kg)  10/08/17 230 lb (104.3 kg)  Psych/Mental status: Alert, oriented x 3 (person, place, & time)       Eyes: PERLA Respiratory: No evidence of acute respiratory distress  Cervical Spine Exam  Inspection: Well healed scar from previous spine surgery detected Alignment: Symmetrical Functional ROM: Adequate ROM      Stability: No instability detected Muscle strength & Tone: Functionally intact Sensory: Unimpaired Palpation: No palpable anomalies              Upper Extremity (UE) Exam    Side: Right upper extremity  Side: Left upper extremity  Inspection: No masses, redness, swelling, or asymmetry. No contractures  Inspection: No masses, redness, swelling, or asymmetry. No contractures  Functional ROM: Adequate ROM           Functional ROM: Adequate ROM          Muscle strength & Tone: Functionally intact  Muscle strength & Tone: Functionally intact  Sensory: Unimpaired  Sensory: Unimpaired  Palpation: No palpable anomalies              Palpation: No palpable anomalies              Specialized Test(s): Deferred         Specialized Test(s): Deferred          Thoracic Spine Exam  Inspection: No masses, redness, or swelling Alignment: Symmetrical Functional ROM: Unrestricted ROM Stability: No instability detected Sensory: Unimpaired Muscle strength & Tone: No palpable anomalies  Lumbar Spine Exam  Inspection: No  masses, redness, or swelling Alignment: Symmetrical Functional ROM: Unrestricted ROM      Stability: No instability detected Muscle strength & Tone: Functionally intact Sensory: Unimpaired Palpation: No palpable anomalies       Provocative Tests: Lumbar Hyperextension and rotation test: evaluation deferred today       Patrick's Maneuver: Positive for right-sided S-I arthralgia              Gait & Posture Assessment  Ambulation: Unassisted Gait: Relatively normal for age and body habitus Posture: WNL   Lower Extremity Exam    Side: Right lower extremity  Side: Left lower extremity  Inspection: No masses, redness, swelling, or asymmetry. No contractures  Inspection: No masses, redness, swelling, or asymmetry. No contractures  Functional ROM: Unrestricted ROM          Functional ROM: Unrestricted ROM          Muscle strength & Tone: Functionally intact  Muscle strength & Tone: Functionally intact  Sensory: Unimpaired  Sensory: Unimpaired  Palpation: No palpable anomalies  Palpation: No palpable anomalies   Assessment  Primary Diagnosis & Pertinent Problem List: The primary encounter diagnosis was Chronic neck pain (Primary Area of Pain)  (Bilateral) (R>L). Diagnoses of Chronic right shoulder pain (Secondary Area of Pain), Chronic pain of both knees (Tertiary Area of Pain) (R>L), Unilateral  groin pain, right, Hip pain, acute, right, Sacroiliac joint pain (right), Chronic pain syndrome, Long term current use of opiate analgesic, Pharmacologic therapy, Disorder of skeletal system, and Problems influencing health status were also pertinent to this visit.  Visit Diagnosis: 1. Chronic neck pain (Primary Area of Pain)  (Bilateral) (R>L)   2. Chronic right shoulder pain (Secondary Area of Pain)   3. Chronic pain of both knees (Tertiary Area of Pain) (R>L)   4. Unilateral groin pain, right   5. Hip pain, acute, right   6. Sacroiliac joint pain (right)   7. Chronic pain syndrome   8. Long term current use of opiate analgesic   9. Pharmacologic therapy   10. Disorder of skeletal system   11. Problems influencing health status    Plan of Care  Initial treatment plan:  Please be advised that as per protocol, today's visit has been an evaluation only. We have not taken over the patient's controlled substance management.  Problem-specific plan: No problem-specific Assessment & Plan notes found for this encounter.  Ordered Lab-work, Procedure(s), Referral(s), & Consult(s): Orders Placed This Encounter  Procedures  . DG Cervical Spine Complete  . DG HIP UNILAT W OR W/O PELVIS 2-3 VIEWS RIGHT  . DG Knee 1-2 Views Left  . DG Knee 1-2 Views Right  . DG Si Joints  . DG Shoulder Right  . Compliance Drug Analysis, Ur  . Comp. Metabolic Panel (12)  . Magnesium  . Vitamin B12  . Sedimentation rate  . 25-Hydroxyvitamin D Lcms D2+D3  . C-reactive protein  . Ambulatory referral to Psychology   Pharmacotherapy: Medications ordered:  No orders of the defined types were placed in this encounter.  Medications administered during this visit: Orine Goga had no medications administered during this visit.   Pharmacotherapy under consideration:  Opioid Analgesics: The patient was informed that there is no guarantee that she would be a candidate for opioid analgesics. The decision will  be made following CDC guidelines. This decision will be based on the results of diagnostic studies, as well as Monica Terry's risk profile.  Membrane stabilizer: To be determined at a later time Muscle  relaxant: To be determined at a later time NSAID: To be determined at a later time Other analgesic(s): To be determined at a later time   Interventional therapies under consideration: Monica Terry was informed that there is no guarantee that she would be a candidate for interventional therapies. The decision will be based on the results of diagnostic studies, as well as Monica Terry's risk profile.  Possible procedure(s):  diagnostic bilateral cervical facet nerve block Possible bilateral cervical facet RFA Diagnostic right shoulder intra-articular joint injection Diagnostic right suprascapular nerve block Possible right suprascapular RFA Diagnostic bilateral intra-articular knee injection Hyalgan series Diagnostic bilateral genicular nerve block Possible bilateral genicular RFA   Provider-requested follow-up: Return for 2nd Visit, w/ Dr. Dossie Arbour, after [facility] eval.  No future appointments.  Primary Care Physician: Marinda Elk, MD Location: [facility] Note by:  Date: 02/08/2018; Time: 10:54 AM  Pain Score Disclaimer: We use the NRS-11 scale. This is a self-reported, subjective measurement of pain severity with only modest accuracy. It is used primarily to identify changes within a particular patient. It must be understood that outpatient pain scales are significantly less accurate that those used for research, where they can be applied under ideal controlled circumstances with minimal exposure to variables. In reality, the score is likely to be a combination of pain intensity and pain affect, where pain affect describes the degree of emotional arousal or changes in action readiness caused by the sensory experience of pain. Factors such as social and work  situation, setting, emotional state, anxiety levels, expectation, and prior pain experience may influence pain perception and show large inter-individual differences that may also be affected by time variables.  Patient instructions provided during this appointment: Patient Instructions    ____________________________________________________________________________________________  Appointment Policy Summary  It is our goal and responsibility to provide the medical community with assistance in the evaluation and management of patients with chronic pain. Unfortunately our resources are limited. Because we do not have an unlimited amount of time, or available appointments, we are required to closely monitor and manage their use. The following rules exist to maximize their use:  Patient's responsibilities: 1. Punctuality:  At what time should I arrive? You should be physically present in our office 30 minutes before your scheduled appointment. Your scheduled appointment is with your assigned healthcare provider. However, it takes 5-10 minutes to be "checked-in", and another 15 minutes for the nurses to do the admission. If you arrive to our office at the time you were given for your appointment, you will end up being at least 20-25 minutes late to your appointment with the provider. 2. Tardiness:  What happens if I arrive only a few minutes after my scheduled appointment time? You will need to reschedule your appointment. The cutoff is your appointment time. This is why it is so important that you arrive at least 30 minutes before that appointment. If you have an appointment scheduled for 10:00 AM and you arrive at 10:01, you will be required to reschedule your appointment.  3. Plan ahead:  Always assume that you will encounter traffic on your way in. Plan for it. If you are dependent on a driver, make sure they understand these rules and the need to arrive early. 4. Other appointments and  responsibilities:  Avoid scheduling any other appointments before or after your pain clinic appointments.  5. Be prepared:  Write down everything that you need to discuss with your healthcare provider and give this information to the  admitting nurse. Write down the medications that you will need refilled. Bring your pills and bottles (even the empty ones), to all of your appointments, except for those where a procedure is scheduled. 6. No children or pets:  Find someone to take care of them. It is not appropriate to bring them in. 7. Scheduling changes:  We request "advanced notification" of any changes or cancellations. 8. Advanced notification:  Defined as a time period of more than 24 hours prior to the originally scheduled appointment. This allows for the appointment to be offered to other patients. 9. Rescheduling:  When a visit is rescheduled, it will require the cancellation of the original appointment. For this reason they both fall within the category of "Cancellations".  10. Cancellations:  They require advanced notification. Any cancellation less than 24 hours before the  appointment will be recorded as a "No Show". 11. No Show:  Defined as an unkept appointment where the patient failed to notify or declare to the practice their intention or inability to keep the appointment.  Corrective process for repeat offenders:  1. Tardiness: Three (3) episodes of rescheduling due to late arrivals will be recorded as one (1) "No Show". 2. Cancellation or reschedule: Three (3) cancellations or rescheduling will be recorded as one (1) "No Show". 3. "No Shows": Three (3) "No Shows" within a 12 month period will result in discharge from the practice.  ____________________________________________________________________________________________   ____________________________________________________________________________________________  Pain Scale  Introduction: The pain score used by this  practice is the Verbal Numerical Rating Scale (VNRS-11). This is an 11-point scale. It is for adults and children 10 years or older. There are significant differences in how the pain score is reported, used, and applied. Forget everything you learned in the past and learn this scoring system.  General Information: The scale should reflect your current level of pain. Unless you are specifically asked for the level of your worst pain, or your average pain. If you are asked for one of these two, then it should be understood that it is over the past 24 hours.  Basic Activities of Daily Living (ADL): Personal hygiene, dressing, eating, transferring, and using restroom.  Instructions: Most patients tend to report their level of pain as a combination of two factors, their physical pain and their psychosocial pain. This last one is also known as "suffering" and it is reflection of how physical pain affects you socially and psychologically. From now on, report them separately. From this point on, when asked to report your pain level, report only your physical pain. Use the following table for reference.  Pain Clinic Pain Levels (0-5/10)  Pain Level Score  Description  No Pain 0   Mild pain 1 Nagging, annoying, but does not interfere with basic activities of daily living (ADL). Patients are able to eat, bathe, get dressed, toileting (being able to get on and off the toilet and perform personal hygiene functions), transfer (move in and out of bed or a chair without assistance), and maintain continence (able to control bladder and bowel functions). Blood pressure and heart rate are unaffected. A normal heart rate for a healthy adult ranges from 60 to 100 bpm (beats per minute).   Mild to moderate pain 2 Noticeable and distracting. Impossible to hide from other people. More frequent flare-ups. Still possible to adapt and function close to normal. It can be very annoying and may have occasional stronger flare-ups.  With discipline, patients may get used to it and adapt.  Moderate pain 3 Interferes significantly with activities of daily living (ADL). It becomes difficult to feed, bathe, get dressed, get on and off the toilet or to perform personal hygiene functions. Difficult to get in and out of bed or a chair without assistance. Very distracting. With effort, it can be ignored when deeply involved in activities.   Moderately severe pain 4 Impossible to ignore for more than a few minutes. With effort, patients may still be able to manage work or participate in some social activities. Very difficult to concentrate. Signs of autonomic nervous system discharge are evident: dilated pupils (mydriasis); mild sweating (diaphoresis); sleep interference. Heart rate becomes elevated (>115 bpm). Diastolic blood pressure (lower number) rises above 100 mmHg. Patients find relief in laying down and not moving.   Severe pain 5 Intense and extremely unpleasant. Associated with frowning face and frequent crying. Pain overwhelms the senses.  Ability to do any activity or maintain social relationships becomes significantly limited. Conversation becomes difficult. Pacing back and forth is common, as getting into a comfortable position is nearly impossible. Pain wakes you up from deep sleep. Physical signs will be obvious: pupillary dilation; increased sweating; goosebumps; brisk reflexes; cold, clammy hands and feet; nausea, vomiting or dry heaves; loss of appetite; significant sleep disturbance with inability to fall asleep or to remain asleep. When persistent, significant weight loss is observed due to the complete loss of appetite and sleep deprivation.  Blood pressure and heart rate becomes significantly elevated. Caution: If elevated blood pressure triggers a pounding headache associated with blurred vision, then the patient should immediately seek attention at an urgent or emergency care unit, as these may be signs of an impending  stroke.    Emergency Department Pain Levels (6-10/10)  Emergency Room Pain 6 Severely limiting. Requires emergency care and should not be seen or managed at an outpatient pain management facility. Communication becomes difficult and requires great effort. Assistance to reach the emergency department may be required. Facial flushing and profuse sweating along with potentially dangerous increases in heart rate and blood pressure will be evident.   Distressing pain 7 Self-care is very difficult. Assistance is required to transport, or use restroom. Assistance to reach the emergency department will be required. Tasks requiring coordination, such as bathing and getting dressed become very difficult.   Disabling pain 8 Self-care is no longer possible. At this level, pain is disabling. The individual is unable to do even the most "basic" activities such as walking, eating, bathing, dressing, transferring to a bed, or toileting. Fine motor skills are lost. It is difficult to think clearly.   Incapacitating pain 9 Pain becomes incapacitating. Thought processing is no longer possible. Difficult to remember your own name. Control of movement and coordination are lost.   The worst pain imaginable 10 At this level, most patients pass out from pain. When this level is reached, collapse of the autonomic nervous system occurs, leading to a sudden drop in blood pressure and heart rate. This in turn results in a temporary and dramatic drop in blood flow to the brain, leading to a loss of consciousness. Fainting is one of the body's self defense mechanisms. Passing out puts the brain in a calmed state and causes it to shut down for a while, in order to begin the healing process.    Summary: 1. Refer to this scale when providing [place] with your pain level. 2. Be accurate and careful when reporting your pain level. This will help with your care. 3. Over-reporting  your pain level will lead to loss of credibility. 4. Even  a level of 1/10 means that there is pain and will be treated at our facility. 5. High, inaccurate reporting will be documented as "Symptom Exaggeration", leading to loss of credibility and suspicions of possible secondary gains such as obtaining more narcotics, or wanting to appear disabled, for fraudulent reasons. 6. Only pain levels of 5 or below will be seen at our facility. 7. Pain levels of 6 and above will be sent to the Emergency Department and the appointment cancelled. ____________________________________________________________________________________________

## 2018-02-12 LAB — COMP. METABOLIC PANEL (12)
A/G RATIO: 1.5 (ref 1.2–2.2)
ALK PHOS: 101 IU/L (ref 39–117)
AST: 30 IU/L (ref 0–40)
Albumin: 4.2 g/dL (ref 3.5–5.5)
BUN/Creatinine Ratio: 10 (ref 9–23)
BUN: 8 mg/dL (ref 6–24)
CHLORIDE: 104 mmol/L (ref 96–106)
Calcium: 10 mg/dL (ref 8.7–10.2)
Creatinine, Ser: 0.79 mg/dL (ref 0.57–1.00)
GFR calc Af Amer: 100 mL/min/{1.73_m2} (ref >59)
GFR calc non Af Amer: 86 mL/min/{1.73_m2} (ref >59)
GLOBULIN, TOTAL: 2.8 g/dL (ref 1.5–4.5)
GLUCOSE: 89 mg/dL (ref 65–99)
POTASSIUM: 5.1 mmol/L (ref 3.5–5.2)
Sodium: 140 mmol/L (ref 134–144)
Total Protein: 7 g/dL (ref 6.0–8.5)

## 2018-02-12 LAB — SEDIMENTATION RATE: Sed Rate: 4 mm/hr (ref 0–40)

## 2018-02-12 LAB — 25-HYDROXY VITAMIN D LCMS D2+D3
25-Hydroxy, Vitamin D-2: 12 ng/mL
25-Hydroxy, Vitamin D-3: 28 ng/mL
25-Hydroxy, Vitamin D: 40 ng/mL

## 2018-02-12 LAB — C-REACTIVE PROTEIN: CRP: 17.3 mg/L — ABNORMAL HIGH (ref 0.0–4.9)

## 2018-02-12 LAB — VITAMIN B12: VITAMIN B 12: 469 pg/mL (ref 232–1245)

## 2018-02-12 LAB — MAGNESIUM: Magnesium: 2.2 mg/dL (ref 1.6–2.3)

## 2018-02-14 LAB — COMPLIANCE DRUG ANALYSIS, UR

## 2018-03-19 ENCOUNTER — Ambulatory Visit: Payer: Self-pay | Admitting: Podiatry

## 2018-03-26 ENCOUNTER — Ambulatory Visit
Admission: RE | Admit: 2018-03-26 | Discharge: 2018-03-26 | Disposition: A | Discharge status: Home / Self Care | Payer: Medicaid Other | Source: Ambulatory Visit | Attending: Nurse Practitioner | Admitting: Nurse Practitioner

## 2018-03-26 DIAGNOSIS — M25511 Pain in right shoulder: Secondary | ICD-10-CM | POA: Diagnosis not present

## 2018-03-26 DIAGNOSIS — M47892 Other spondylosis, cervical region: Secondary | ICD-10-CM | POA: Insufficient documentation

## 2018-03-26 DIAGNOSIS — M25551 Pain in right hip: Secondary | ICD-10-CM | POA: Diagnosis present

## 2018-03-26 DIAGNOSIS — M2392 Unspecified internal derangement of left knee: Secondary | ICD-10-CM | POA: Diagnosis not present

## 2018-03-26 DIAGNOSIS — M542 Cervicalgia: Principal | ICD-10-CM

## 2018-03-26 DIAGNOSIS — M25561 Pain in right knee: Secondary | ICD-10-CM | POA: Insufficient documentation

## 2018-03-26 DIAGNOSIS — G8929 Other chronic pain: Secondary | ICD-10-CM | POA: Insufficient documentation

## 2018-03-26 DIAGNOSIS — M25562 Pain in left knee: Secondary | ICD-10-CM | POA: Diagnosis not present

## 2018-03-26 DIAGNOSIS — M25811 Other specified joint disorders, right shoulder: Secondary | ICD-10-CM | POA: Insufficient documentation

## 2018-03-26 DIAGNOSIS — M533 Sacrococcygeal disorders, not elsewhere classified: Secondary | ICD-10-CM | POA: Diagnosis not present

## 2018-03-26 DIAGNOSIS — M2391 Unspecified internal derangement of right knee: Secondary | ICD-10-CM | POA: Insufficient documentation

## 2018-03-26 DIAGNOSIS — M19011 Primary osteoarthritis, right shoulder: Secondary | ICD-10-CM | POA: Insufficient documentation

## 2018-03-27 ENCOUNTER — Ambulatory Visit
Admission: RE | Admit: 2018-03-27 | Discharge: 2018-03-27 | Disposition: A | Discharge status: Home / Self Care | Payer: Medicaid Other | Source: Ambulatory Visit | Attending: Internal Medicine | Admitting: Internal Medicine

## 2018-03-27 ENCOUNTER — Ambulatory Visit: Payer: Medicaid Other | Admitting: Anesthesiology

## 2018-03-27 ENCOUNTER — Other Ambulatory Visit: Payer: Self-pay

## 2018-03-27 ENCOUNTER — Encounter: Payer: Self-pay | Admitting: *Deleted

## 2018-03-27 ENCOUNTER — Encounter
Admission: RE | Disposition: A | Discharge status: Home / Self Care | Payer: Self-pay | Source: Ambulatory Visit | Attending: Internal Medicine

## 2018-03-27 DIAGNOSIS — F319 Bipolar disorder, unspecified: Secondary | ICD-10-CM | POA: Diagnosis not present

## 2018-03-27 DIAGNOSIS — E785 Hyperlipidemia, unspecified: Secondary | ICD-10-CM | POA: Diagnosis not present

## 2018-03-27 DIAGNOSIS — Z8619 Personal history of other infectious and parasitic diseases: Secondary | ICD-10-CM | POA: Insufficient documentation

## 2018-03-27 DIAGNOSIS — Z87891 Personal history of nicotine dependence: Secondary | ICD-10-CM | POA: Diagnosis not present

## 2018-03-27 DIAGNOSIS — Z90710 Acquired absence of both cervix and uterus: Secondary | ICD-10-CM | POA: Diagnosis not present

## 2018-03-27 DIAGNOSIS — M961 Postlaminectomy syndrome, not elsewhere classified: Secondary | ICD-10-CM | POA: Diagnosis not present

## 2018-03-27 DIAGNOSIS — Z79899 Other long term (current) drug therapy: Secondary | ICD-10-CM | POA: Diagnosis not present

## 2018-03-27 DIAGNOSIS — J449 Chronic obstructive pulmonary disease, unspecified: Secondary | ICD-10-CM | POA: Diagnosis not present

## 2018-03-27 DIAGNOSIS — Z7951 Long term (current) use of inhaled steroids: Secondary | ICD-10-CM | POA: Diagnosis not present

## 2018-03-27 DIAGNOSIS — Z8249 Family history of ischemic heart disease and other diseases of the circulatory system: Secondary | ICD-10-CM | POA: Insufficient documentation

## 2018-03-27 DIAGNOSIS — Z8371 Family history of colonic polyps: Secondary | ICD-10-CM | POA: Insufficient documentation

## 2018-03-27 DIAGNOSIS — K219 Gastro-esophageal reflux disease without esophagitis: Secondary | ICD-10-CM | POA: Diagnosis not present

## 2018-03-27 DIAGNOSIS — Z8379 Family history of other diseases of the digestive system: Secondary | ICD-10-CM | POA: Diagnosis not present

## 2018-03-27 DIAGNOSIS — E039 Hypothyroidism, unspecified: Secondary | ICD-10-CM | POA: Diagnosis not present

## 2018-03-27 DIAGNOSIS — K5900 Constipation, unspecified: Secondary | ICD-10-CM | POA: Diagnosis not present

## 2018-03-27 DIAGNOSIS — Z7984 Long term (current) use of oral hypoglycemic drugs: Secondary | ICD-10-CM | POA: Diagnosis not present

## 2018-03-27 DIAGNOSIS — K640 First degree hemorrhoids: Secondary | ICD-10-CM | POA: Insufficient documentation

## 2018-03-27 DIAGNOSIS — Z833 Family history of diabetes mellitus: Secondary | ICD-10-CM | POA: Insufficient documentation

## 2018-03-27 DIAGNOSIS — Z9081 Acquired absence of spleen: Secondary | ICD-10-CM | POA: Insufficient documentation

## 2018-03-27 DIAGNOSIS — Z7982 Long term (current) use of aspirin: Secondary | ICD-10-CM | POA: Insufficient documentation

## 2018-03-27 DIAGNOSIS — E7800 Pure hypercholesterolemia, unspecified: Secondary | ICD-10-CM | POA: Diagnosis not present

## 2018-03-27 DIAGNOSIS — Y839 Surgical procedure, unspecified as the cause of abnormal reaction of the patient, or of later complication, without mention of misadventure at the time of the procedure: Secondary | ICD-10-CM | POA: Diagnosis not present

## 2018-03-27 DIAGNOSIS — Z981 Arthrodesis status: Secondary | ICD-10-CM | POA: Insufficient documentation

## 2018-03-27 DIAGNOSIS — Z1211 Encounter for screening for malignant neoplasm of colon: Secondary | ICD-10-CM | POA: Insufficient documentation

## 2018-03-27 DIAGNOSIS — D649 Anemia, unspecified: Secondary | ICD-10-CM | POA: Insufficient documentation

## 2018-03-27 DIAGNOSIS — K2970 Gastritis, unspecified, without bleeding: Secondary | ICD-10-CM | POA: Diagnosis not present

## 2018-03-27 HISTORY — PX: ESOPHAGOGASTRODUODENOSCOPY (EGD) WITH PROPOFOL: SHX5813

## 2018-03-27 HISTORY — DX: Type 2 diabetes mellitus without complications: E11.9

## 2018-03-27 HISTORY — PX: COLONOSCOPY WITH PROPOFOL: SHX5780

## 2018-03-27 SURGERY — COLONOSCOPY WITH PROPOFOL
Anesthesia: General

## 2018-03-27 MED ORDER — LIDOCAINE HCL (CARDIAC) 20 MG/ML IV SOLN
INTRAVENOUS | Status: DC | PRN
  Administered 2018-03-27: 50 mg via INTRAVENOUS

## 2018-03-27 MED ORDER — MIDAZOLAM HCL 2 MG/2ML IJ SOLN
INTRAMUSCULAR | Status: AC
  Filled 2018-03-27: qty 2

## 2018-03-27 MED ORDER — PROPOFOL 500 MG/50ML IV EMUL
INTRAVENOUS | Status: AC
  Filled 2018-03-27: qty 50

## 2018-03-27 MED ORDER — PROPOFOL 500 MG/50ML IV EMUL
INTRAVENOUS | Status: DC | PRN
  Administered 2018-03-27: 150 ug/kg/min via INTRAVENOUS

## 2018-03-27 MED ORDER — MIDAZOLAM HCL 2 MG/2ML IJ SOLN
INTRAMUSCULAR | Status: DC | PRN
  Administered 2018-03-27: 2 mg via INTRAVENOUS

## 2018-03-27 MED ORDER — PROPOFOL 10 MG/ML IV BOLUS
INTRAVENOUS | Status: DC | PRN
  Administered 2018-03-27: 30 mg via INTRAVENOUS
  Administered 2018-03-27 (×2): 20 mg via INTRAVENOUS
  Administered 2018-03-27: 30 mg via INTRAVENOUS
  Administered 2018-03-27 (×3): 20 mg via INTRAVENOUS

## 2018-03-27 MED ORDER — SODIUM CHLORIDE 0.9 % IV SOLN
INTRAVENOUS | Status: DC
  Administered 2018-03-27: 08:00:00 via INTRAVENOUS

## 2018-03-27 MED ORDER — PROPOFOL 500 MG/50ML IV EMUL
INTRAVENOUS | Status: AC
  Filled 2018-03-27: qty 100

## 2018-03-27 NOTE — Op Note (Signed)
[facility]astroenterology Patient Name: Monica Terry Procedure Date: 03/27/2018 8:39 AM MRN: [id number] Account #: [id number] Date of Birth: 14-Apr-1966 Admit Type: Outpatient Age: 52 Room: [facility] ENDO ROOM 3 Gender: Female Note Status: Finalized Procedure:            Upper GI endoscopy Indications:          Dysphagia, Esophageal reflux Providers:            Benay Pike. [place] MD, MD Medicines:            Propofol per Anesthesia Complications:        No immediate complications. Procedure:            Pre-Anesthesia Assessment:                       - The risks and benefits of the procedure and the                        sedation options and risks were discussed with the                        patient. All questions were answered and informed                        consent was obtained.                       - Patient identification and proposed procedure were                        verified prior to the procedure by the nurse. The                        procedure was verified in the procedure room.                       - ASA Grade Assessment: II - A patient with mild                        systemic disease.                       - After reviewing the risks and benefits, the patient                        was deemed in satisfactory condition to undergo the                        procedure.                       After obtaining informed consent, the endoscope was                        passed under direct vision. Throughout the procedure,                        the patient's blood pressure, pulse, and oxygen                        saturations were monitored continuously. The Endoscope  was introduced through the mouth, and advanced to the                        third part of duodenum. The upper GI endoscopy was                        accomplished without difficulty. The patient tolerated                        the procedure  well. Findings:      Scattered mild mucosal changes characterized by white specks were found       in the middle third of the esophagus and in the lower third of the       esophagus. Biopsies were obtained from the proximal and distal esophagus       with cold forceps for histology of suspected eosinophilic esophagitis.       The scope was withdrawn. Dilation was performed with a Maloney dilator       with no resistance at 68 Fr.      Localized minimal inflammation characterized by congestion (edema) was       found in the gastric antrum.      The cardia and gastric fundus were normal on retroflexion.      The examined duodenum was normal. Impression:           - White specked mucosa in the esophagus. Biopsied.                        Dilated.                       - Gastritis.                       - Normal examined duodenum. Recommendation:       - Await pathology results.                       - Proceed with colonoscopy Procedure Code(s):    --- Professional ---                       [phone], Esophagogastroduodenoscopy, flexible, transoral;                        with biopsy, single or multiple                       43450, Dilation of esophagus, by unguided sound or                        bougie, single or multiple passes Diagnosis Code(s):    --- Professional ---                       K22.8, Other specified diseases of esophagus                       K29.70, Gastritis, unspecified, without bleeding                       R13.10, Dysphagia, unspecified  K21.9, Gastro-esophageal reflux disease without                        esophagitis CPT copyright 2017 [employer]. All rights reserved. The codes documented in this report are preliminary and upon coder review may  be revised to meet current compliance requirements. Efrain Sella MD, MD 03/27/2018 9:04:14 AM This report has been signed electronically. Number of Addenda: 0 Note Initiated On:  03/27/2018 8:39 AM      [facility]

## 2018-03-27 NOTE — Transfer of Care (Signed)
Immediate Anesthesia Transfer of Care Note  Patient: Rayla Pember  Procedure(s) Performed: COLONOSCOPY WITH PROPOFOL (N/A ) ESOPHAGOGASTRODUODENOSCOPY (EGD) WITH PROPOFOL (N/A )  Patient Location: PACU  Anesthesia Type:MAC  Level of Consciousness: awake  Airway & Oxygen Therapy: Patient Spontanous Breathing  Post-op Assessment: Report given to RN  Post vital signs: stable  Last Vitals:  Vitals Value Taken Time  BP 115/74 03/27/2018  9:26 AM  Temp 36.1 C 03/27/2018  9:26 AM  Pulse 88 03/27/2018  9:27 AM  Resp    SpO2 98 % 03/27/2018  9:27 AM  Vitals shown include unvalidated device data.  Last Pain:  Vitals:   03/27/18 0926  TempSrc: Tympanic  PainSc: 0-No pain         Complications: No apparent anesthesia complications

## 2018-03-27 NOTE — Anesthesia Preprocedure Evaluation (Signed)
Anesthesia Evaluation  Patient identified by MRN, date of birth, ID band Patient awake    Reviewed: Allergy & Precautions, H&P , NPO status , Patient's Chart, lab work & pertinent test results, reviewed documented beta blocker date and time   Airway Mallampati: II   Neck ROM: full    Dental  (+) Poor Dentition   Pulmonary neg pulmonary ROS, shortness of breath and with exertion, COPD,  COPD inhaler, former smoker,    Pulmonary exam normal        Cardiovascular Exercise Tolerance: Good (-) anginanegative cardio ROS Normal cardiovascular exam+ Valvular Problems/Murmurs  Rhythm:regular Rate:Normal     Neuro/Psych PSYCHIATRIC DISORDERS Anxiety Depression Bipolar Disorder  Neuromuscular disease negative neurological ROS  negative psych ROS   GI/Hepatic negative GI ROS, Neg liver ROS, GERD  Medicated,(+) Hepatitis -  Endo/Other  negative endocrine ROSdiabetesHypothyroidism Hyperthyroidism   Renal/GU negative Renal ROS  negative genitourinary   Musculoskeletal   Abdominal   Peds  Hematology negative hematology ROS (+) anemia ,   Anesthesia Other Findings Past Medical History: No date: Abnormal uterine bleeding No date: Anemia No date: Anxiety No date: Arthritis     Comment:  NECK No date: Bell's palsy     Comment:  right side No date: Bipolar 1 disorder (HCC) No date: Brittle bones No date: COPD (chronic obstructive pulmonary disease) (HCC) No date: Depression No date: Diabetes mellitus without complication (HCC) No date: GERD (gastroesophageal reflux disease) No date: Heart murmur 2016: Hepatitis     Comment:  c: treated by Dr. Allen Norris No date: Hypercholesteremia No date: Hypothyroidism No date: Neck pain No date: Neck pain, chronic No date: Panic attack No date: Sciatica No date: Shortness of breath dyspnea No date: Thyroid disease Past Surgical History: No date: ABDOMINAL HYSTERECTOMY 11/22/2017: CARPAL  TUNNEL RELEASE; Right     Comment:  Procedure: CARPAL TUNNEL RELEASE;  Surgeon: Leanor Kail, MD;  Location: [facility] ORS;  Service: Orthopedics;                Laterality: Right; 02/08/2016: HYSTEROSCOPY W/D&C; N/A     Comment:  Procedure: DILATATION AND CURETTAGE /HYSTEROSCOPY;                Surgeon: Brayton Mars, MD;  Location: [facility] ORS;                Service: Gynecology;  Laterality: N/A; 2009: NECK SURGERY     Comment:  failed fusion 04/11/2016: SALPINGOOPHORECTOMY; Bilateral     Comment:  Procedure: SALPINGO OOPHORECTOMY;  Surgeon: Brayton Mars, MD;  Location: [facility] ORS;  Service:               Gynecology;  Laterality: Bilateral; 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 No date: TUBAL LIGATION 04/11/2016: VAGINAL HYSTERECTOMY; Bilateral     Comment:  Procedure: HYSTERECTOMY VAGINAL;  Surgeon: Brayton Mars, MD;  Location: [facility] ORS;  Service:               Gynecology;  Laterality: Bilateral; BMI    Body Mass Index:  36.49 kg/m     Reproductive/Obstetrics negative OB ROS  Anesthesia Physical Anesthesia Plan  ASA: III  Anesthesia Plan: General   Post-op Pain Management:    Induction:   PONV Risk Score and Plan:   Airway Management Planned:   Additional Equipment:   Intra-op Plan:   Post-operative Plan:   Informed Consent: I have reviewed the patients History and Physical, chart, labs and discussed the procedure including the risks, benefits and alternatives for the proposed anesthesia with the patient or authorized representative who has indicated his/her understanding and acceptance.   Dental Advisory Given  Plan Discussed with: CRNA  Anesthesia Plan Comments:         Anesthesia Quick Evaluation

## 2018-03-27 NOTE — Interval H&P Note (Signed)
History and Physical Interval Note:  03/27/2018 8:06 AM  Monica Terry  has presented today for surgery, with the diagnosis of SCREENING DYSPHAGIA  The various methods of treatment have been discussed with the patient and family. After consideration of risks, benefits and other options for treatment, the patient has consented to  Procedure(s): COLONOSCOPY WITH PROPOFOL (N/A) ESOPHAGOGASTRODUODENOSCOPY (EGD) WITH PROPOFOL (N/A) as a surgical intervention .  The patient's history has been reviewed, patient examined, no change in status, stable for surgery.  I have reviewed the patient's chart and labs.  Questions were answered to the patient's satisfaction.     [place], [place]

## 2018-03-27 NOTE — Op Note (Signed)
[facility] Patient Name: Monica Terry Procedure Date: 03/27/2018 8:39 AM MRN: [id number] Account #: [id number] Date of Birth: 07-May-1966 Admit Type: Outpatient Age: 52 Room: [facility] ENDO ROOM 3 Gender: Female Note Status: Finalized Procedure:            Colonoscopy Indications:          Colon cancer screening in patient at increased risk:                        Family history of 1st-degree relative with colon polyps Providers:            Benay Pike. [place] MD, MD Medicines:            Propofol per Anesthesia Complications:        No immediate complications. Procedure:            Pre-Anesthesia Assessment:                       - The risks and benefits of the procedure and the                        sedation options and risks were discussed with the                        patient. All questions were answered and informed                        consent was obtained.                       - Patient identification and proposed procedure were                        verified prior to the procedure by the nurse. The                        procedure was verified in the procedure room.                       - ASA Grade Assessment: II - A patient with mild                        systemic disease.                       - After reviewing the risks and benefits, the patient                        was deemed in satisfactory condition to undergo the                        procedure.                       After obtaining informed consent, the colonoscope was                        passed under direct vision. Throughout the procedure,                        the patient's blood pressure, pulse, and oxygen  saturations were monitored continuously. The                        Colonoscope was introduced through the anus and                        advanced to the the terminal ileum, with identification                        of the appendiceal  orifice and IC valve. The                        colonoscopy was performed without difficulty. The                        patient tolerated the procedure well. The quality of                        the bowel preparation was adequate. The terminal ileum,                        ileocecal valve, appendiceal orifice, and rectum were                        photographed. Findings:      The perianal and digital rectal examinations were normal. Pertinent       negatives include normal sphincter tone and no palpable rectal lesions.      Non-bleeding internal hemorrhoids were found during retroflexion. The       hemorrhoids were Grade I (internal hemorrhoids that do not prolapse).      The exam was otherwise without abnormality. Impression:           - Non-bleeding internal hemorrhoids.                       - The examination was otherwise normal.                       - No specimens collected. Recommendation:       - Patient has a contact number available for                        emergencies. The signs and symptoms of potential                        delayed complications were discussed with the patient.                        Return to normal activities tomorrow. Written discharge                        instructions were provided to the patient.                       - Resume previous diet.                       - Continue present medications.                       - Repeat colonoscopy in 5 years for screening purposes.                       -  Await pathology results from EGD, also performed                        today.                       - Return to physician assistant in 8 weeks.                       - The findings and recommendations were discussed with                        the patient and their family. Procedure Code(s):    --- Professional ---                       X8333, Colorectal cancer screening; colonoscopy on                        individual at high risk Diagnosis Code(s):     --- Professional ---                       K64.0, First degree hemorrhoids                       Z83.71, Family history of colonic polyps CPT copyright 2017 [employer] Association. All rights reserved. The codes documented in this report are preliminary and upon coder review may  be revised to meet current compliance requirements. Efrain Sella MD, MD 03/27/2018 9:22:33 AM This report has been signed electronically. Number of Addenda: 0 Note Initiated On: 03/27/2018 8:39 AM Scope Withdrawal Time: 0 hours 7 minutes 19 seconds  Total Procedure Duration: 0 hours 12 minutes 25 seconds       [facility]

## 2018-03-27 NOTE — Anesthesia Post-op Follow-up Note (Signed)
Anesthesia QCDR form completed.        

## 2018-03-27 NOTE — H&P (Signed)
Outpatient short stay form Pre-procedure 03/27/2018 8:03 AM Monica Terry K. Monica Terry, M.D.  Primary Physician: Monica Cradle, PA  Reason for visit:  Family hx of colon polyps, Dysphagia.   History of present illness:  52 year old female presents for colorectal cancer screening and reports a family history of colon polyps. She dey change in bowel habits, abdominal pain or rectal bleeding. He has intermittent dysphasia to solids and liquids. Also has long history of GERD currently controlled with medication.    Current Facility-Administered Medications:  .  0.9 %  sodium chloride infusion, , Intravenous, Continuous, Monica Terry, Monica Pike, MD  Medications Prior to Admission  Medication Sig Dispense Refill Last Dose  . albuterol (ACCUNEB) 1.25 MG/3ML nebulizer solution Take 1 ampule by nebulization every 6 (six) hours as needed for wheezing.   03/27/2018 at 0600  . albuterol (PROAIR HFA) 108 (90 BASE) MCG/ACT inhaler Inhale 1-2 puffs every 6 (six) hours as needed into the lungs for wheezing or shortness of breath.    03/27/2018 at 0600  . alendronate (FOSAMAX) 70 MG tablet Take 70 mg every Tuesday by mouth. Take with a full glass of water on an empty stomach.    Past Month at Unknown time  . artificial tears (LACRILUBE) OINT ophthalmic ointment Place into the right eye at bedtime as needed for dry eyes. 1 Tube 0 03/26/2018 at Unknown time  . aspirin 81 MG tablet Take 81 mg by mouth daily.   03/22/2018 at Unknown time  . buPROPion (WELLBUTRIN XL) 150 MG 24 hr tablet Take 150 mg daily by mouth.   03/27/2018 at 0600  . cetirizine (ZYRTEC) 10 MG tablet Take 10 mg by mouth daily.    03/26/2018 at Unknown time  . docusate sodium (COLACE) 100 MG capsule TAKE 1 CAPSULE BY MOUTH TWICE DAILY (Patient taking differently: TAKE 100 MG BY MOUTH TWICE DAILY) 30 capsule 1 03/27/2018 at 0600  . estradiol (ESTRACE) 1 MG tablet Take 1 tablet (1 mg total) by mouth daily. 30 tablet 12 Past Week at Unknown time  . fluticasone (FLONASE)  50 MCG/ACT nasal spray Place 1 spray into both nostrils daily. (Patient taking differently: Place 2 sprays daily as needed into both nostrils for allergies. ) 1 g 0 Past Week at Unknown time  . Fluticasone-Salmeterol (ADVAIR DISKUS) 250-50 MCG/DOSE AEPB Inhale 2 puffs 2 (two) times daily into the lungs.    03/27/2018 at 0600  . gabapentin (NEURONTIN) 100 MG capsule Take 100 mg 2 (two) times daily as needed by mouth (for pain).    03/26/2018 at 0600  . levothyroxine (SYNTHROID, LEVOTHROID) 175 MCG tablet Take 175 mcg daily before breakfast by mouth.    03/26/2018 at 0600  . lidocaine (XYLOCAINE) 5 % ointment Apply 1 application 2 (two) times daily as needed topically for moderate pain.    Past Week at Unknown time  . liraglutide (VICTOZA) 18 MG/3ML SOPN Inject 0.6 mg into the skin 1 day or 1 dose.   03/26/2018 at Unknown time  . meloxicam (MOBIC) 15 MG tablet Take 15 mg daily by mouth.   03/22/2018 at Unknown time  . NON FORMULARY Apply 1 g 2 (two) times daily topically. Baclofen5%/Diclofenac3%/Gabapentin10% 120 gm   Past Week at Unknown time  . ondansetron (ZOFRAN) 4 MG tablet Take 4 mg every 8 (eight) hours as needed by mouth for nausea or vomiting.   Past Week at Unknown time  . pregabalin (LYRICA) 100 MG capsule Take 100 mg 2 (two) times daily by mouth.  03/27/2018 at 0600  . ranitidine (ZANTAC) 150 MG capsule Take 150 mg daily as needed by mouth for heartburn.    03/26/2018 at Unknown time  . rizatriptan (MAXALT) 5 MG tablet Take 5 mg by mouth as needed for migraine. May repeat in 2 hours if needed   03/26/2018 at Unknown time  . simvastatin (ZOCOR) 20 MG tablet Take 20 mg by mouth every morning.   03/26/2018 at Unknown time  . tiotropium (SPIRIVA HANDIHALER) 18 MCG inhalation capsule Place 18 mcg into inhaler and inhale every morning.   03/27/2018 at 0600  . tiZANidine (ZANAFLEX) 4 MG tablet Take 4 mg every 6 (six) hours as needed by mouth for muscle spasms.    03/26/2018 at Unknown time  . traZODone (DESYREL) 100  MG tablet Take 50 mg by mouth at bedtime as needed for sleep.   03/26/2018 at Unknown time  . montelukast (SINGULAIR) 10 MG tablet Take 10 mg at bedtime by mouth.   Taking  . oxyCODONE-acetaminophen (PERCOCET/ROXICET) 5-325 MG tablet Take 1 tablet by mouth every 6 (six) hours as needed for severe pain (moderate to severe pain (when tolerating fluids)). (Patient not taking: Reported on 03/27/2018) 15 tablet 0 Completed Course at Unknown time     No Known Allergies   Past Medical History:  Diagnosis Date  . Abnormal uterine bleeding   . Anemia   . Anxiety   . Arthritis    NECK  . Bell's palsy    right side  . Bipolar 1 disorder ([place])   . Brittle bones   . COPD (chronic obstructive pulmonary disease) ([place])   . Depression   . Diabetes mellitus without complication ([place])   . GERD (gastroesophageal reflux disease)   . Heart murmur   . Hepatitis 2016   c: treated by Monica Terry  . Hypercholesteremia   . Hypothyroidism   . Neck pain   . Neck pain, chronic   . Panic attack   . Sciatica   . Shortness of breath dyspnea   . Thyroid disease     Review of systems:      Physical Exam  Gen: Alert, oriented. Appears stated age.  HEENT: [place]/AT. PERRLA. Lungs: CTA, no wheezes. CV: RR nl S1, S2. Abd: soft, benign, no masses. BS+ Ext: No edema. Pulses 2+    Planned procedures: EGD and colonoscopy. The patient understands the nature of the planned procedure, indications, risks, alternatives and potential complications including but not limited to bleeding, infection, perforation, damage to internal organs and possible oversedation/side effects from anesthesia. The patient agrees and gives consent to proceed.  Please refer to procedure notes for findings, recommendations and patient disposition/instructions.    Monica Terry K. Monica Terry, M.D. Gastroenterology 03/27/2018  8:03 AM

## 2018-03-28 LAB — SURGICAL PATHOLOGY

## 2018-03-28 NOTE — Progress Notes (Signed)
Results were reviewed and found to be: mildly abnormal  No acute injury or pathology identified  Review would suggest interventional pain management techniques may be of benefit 

## 2018-03-28 NOTE — Anesthesia Postprocedure Evaluation (Signed)
Anesthesia Post Note  Patient: Monica Terry  Procedure(s) Performed: COLONOSCOPY WITH PROPOFOL (N/A ) ESOPHAGOGASTRODUODENOSCOPY (EGD) WITH PROPOFOL (N/A )  Patient location during evaluation: PACU Anesthesia Type: General Level of consciousness: awake and alert Pain management: pain level controlled Vital Signs Assessment: post-procedure vital signs reviewed and stable Respiratory status: spontaneous breathing, nonlabored ventilation, respiratory function stable and patient connected to nasal cannula oxygen Cardiovascular status: blood pressure returned to baseline and stable Postop Assessment: no apparent nausea or vomiting Anesthetic complications: no     Last Vitals:  Vitals:   03/27/18 0926 03/27/18 0946  BP:  118/65  Pulse:    Resp:    Temp: (!) 36.1 C   SpO2:      Last Pain:  Vitals:   03/28/18 0743  TempSrc:   PainSc: 0-No pain                 Molli Barrows

## 2018-03-30 ENCOUNTER — Encounter: Payer: Self-pay | Admitting: Internal Medicine

## 2018-04-02 ENCOUNTER — Encounter: Payer: Self-pay | Admitting: Podiatry

## 2018-04-02 ENCOUNTER — Ambulatory Visit: Payer: Medicaid Other | Admitting: Podiatry

## 2018-04-02 DIAGNOSIS — M79674 Pain in right toe(s): Secondary | ICD-10-CM | POA: Diagnosis not present

## 2018-04-02 DIAGNOSIS — L600 Ingrowing nail: Secondary | ICD-10-CM

## 2018-04-02 DIAGNOSIS — B351 Tinea unguium: Secondary | ICD-10-CM

## 2018-04-02 NOTE — Patient Instructions (Signed)

## 2018-04-02 NOTE — Progress Notes (Signed)
This patient presents to the office with chief complaint of a painful ingrowing toenail on the inside border big toe, right foot.  She says this toenail has been painful for the last year but she has been working on her nail herself.  She says it is getting worse over time and she presents the office today for an evaluation and treatment of this painful ingrowing toenail right big toe.  Patient states that she is prediabetic an evaluation of her blood sugar reveals a value of 89.   General Appearance  Alert, conversant and in no acute stress.  Vascular  Dorsalis pedis and posterior tibial  pulses are palpable  bilaterally.  Capillary return is within normal limits  bilaterally. Temperature is within normal limits  Bilaterally. Hair present on digits.  Neurologic  Senn-Weinstein monofilament wire test within normal limits  bilaterally. Muscle power within normal limits bilaterally.  Nails Thick disfigured discolored nails with subungual debris  from hallux to fifth toes bilaterally. No evidence of bacterial infection or drainage bilaterally. Marked incurvation noted along the medial borders of both great toes.  Orthopedic  No limitations of motion of motion feet .  No crepitus or effusions noted.  No bony pathology or digital deformities noted.  Skin  normotropic skin with no porokeratosis noted bilaterally.  No signs of infections or ulcers noted.    Ingrown Toenail right hallux.  ROV.  Nail surgery.  Treatment options and alternatives discussed.  Recommended permanent phenol matrixectomy and patient agreed.  Right hallux  was prepped with alcohol and a toe block of 3cc of 2% lidocaine plain was administered in a digital toe block. .  The toe was then prepped with betadine solution .  The offending nail border was then excised and matrix tissue exposed.  Phenol was then applied to the matrix tissue followed by an alcohol wash.  Antibiotic ointment and a dry sterile dressing was applied.  The patient  was dispensed instructions for aftercare. RTC 10 days.     Gardiner Barefoot DPM    .

## 2018-04-09 ENCOUNTER — Ambulatory Visit: Payer: Medicaid Other | Admitting: Podiatry

## 2018-04-09 ENCOUNTER — Encounter: Payer: Self-pay | Admitting: Podiatry

## 2018-04-09 DIAGNOSIS — B351 Tinea unguium: Secondary | ICD-10-CM

## 2018-04-09 DIAGNOSIS — M79675 Pain in left toe(s): Secondary | ICD-10-CM

## 2018-04-09 DIAGNOSIS — L600 Ingrowing nail: Secondary | ICD-10-CM | POA: Diagnosis not present

## 2018-04-09 DIAGNOSIS — Z09 Encounter for follow-up examination after completed treatment for conditions other than malignant neoplasm: Secondary | ICD-10-CM

## 2018-04-09 NOTE — Progress Notes (Signed)
This patient returns to the office follow-up for correction of an ingrowing toenail on the right big toe.  She says she is doing much better and not having any pain and discomfort.  She is very pleased with her progress.  She says that she would like to have the same procedure performed on the inside nail on the left big toe.  She says this nail is painful walking and wearing her shoes.   General Appearance  Alert, conversant and in no acute stress.  Vascular  Dorsalis pedis and posterior tibial  pulses are palpable  bilaterally.  Capillary return is within normal limits  bilaterally. Temperature is within normal limits  bilaterally.  Neurologic  Senn-Weinstein monofilament wire test within normal limits  bilaterally. Muscle power within normal limits bilaterally.  Nails healing noted along the medial border of the right great toenail.  No signs of redness or swelling  or infection.  Patient has marked incurvation noted along the medial border of the left great toenail. Thick disfigured discolored hallux toenail left.  Orthopedic  No limitations of motion of motion feet .  No crepitus or effusions noted.  No bony pathology or digital deformities noted.  Skin  normotropic skin with no porokeratosis noted bilaterally.  No signs of infections or ulcers noted.    Ingrown toenail medial border left great toenail  ROV  Nail surgery.  Treatment options and alternatives discussed.  Recommended permanent phenol matrixectomy and patient agreed.  Right hallux was prepped with alcohol and a toe block of 3cc of 2% lidocaine plain was administered in a digital toe block. .  The toe was then prepped with betadine solution .  The offending nail border was then excised and matrix tissue exposed.  Phenol was then applied to the matrix tissue followed by an alcohol wash.  Antibiotic ointment and a dry sterile dressing was applied.  The patient was dispensed instructions for aftercare.  Her right great toenail  is  healing nicely from previous nail surgery.  RTC 1 week.     Gardiner Barefoot DPM

## 2018-04-09 NOTE — Patient Instructions (Signed)

## 2018-04-16 ENCOUNTER — Ambulatory Visit: Payer: Medicaid Other | Admitting: Podiatry

## 2018-05-09 ENCOUNTER — Ambulatory Visit: Payer: Medicaid Other | Admitting: Pain Medicine

## 2018-05-15 DIAGNOSIS — M2240 Chondromalacia patellae, unspecified knee: Secondary | ICD-10-CM | POA: Insufficient documentation

## 2018-05-15 NOTE — Progress Notes (Signed)
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 tablet (5 mg total) by mouth daily as needed for severe pain., Disp: 30 tablet, Rfl: 0 .  pregabalin (LYRICA) 100 MG capsule, Take 100 mg 2 (two) times daily by mouth. , Disp: , Rfl:  .  ranitidine (ZANTAC) 150 MG capsule, Take 150 mg daily as needed by mouth for heartburn. , Disp: , Rfl:  .  rizatriptan (MAXALT) 5 MG tablet, Take 5 mg by mouth as needed for migraine. May repeat in 2 hours if needed, Disp: , Rfl:  .  simvastatin (ZOCOR) 20 MG tablet, Take 20 mg by mouth every morning., Disp: , Rfl:  .  tiotropium (SPIRIVA HANDIHALER) 18 MCG inhalation capsule, Place 18 mcg into inhaler and inhale every morning., Disp: , Rfl:  .  tiZANidine (ZANAFLEX) 4 MG tablet, Take 4 mg every 6 (six) hours as needed by mouth for muscle spasms. , Disp: , Rfl:  .  traZODone  (DESYREL) 100 MG tablet, Take 50 mg by mouth at bedtime as needed for sleep., Disp: , Rfl:   ROS  Constitutional: Denies any fever or chills Gastrointestinal: No reported hemesis, hematochezia, vomiting, or acute GI distress Musculoskeletal: Denies any acute onset joint swelling, redness, loss of ROM, or weakness Neurological: No reported episodes of acute onset apraxia, aphasia, dysarthria, agnosia, amnesia, paralysis, loss of coordination, or loss of consciousness  Allergies  Ms. Nicolaou has No Known Allergies.  PFSH  Drug: Ms. Lover  reports that she does not use drugs. Alcohol:  reports that she drinks alcohol. Tobacco:  reports that  she quit smoking about 3 months ago. Her smoking use included cigarettes. She started smoking about 44 years ago. She has a 4.10 pack-year smoking history. She has never used smokeless tobacco. Medical:  has a past medical history of Abnormal uterine bleeding, Anemia, Anxiety, Arthritis, Bell's palsy, Bipolar 1 disorder ([place]), Brittle bones, COPD (chronic obstructive pulmonary disease) ([place]), Depression, Diabetes mellitus without complication ([place]), GERD (gastroesophageal reflux disease), Heart murmur, Hepatitis (2016), Hypercholesteremia, Hypothyroidism, Neck pain, Neck pain, chronic, Panic attack, Sciatica, Shortness of breath dyspnea, and Thyroid disease. Surgical: Ms. Hogston  has a past surgical history that includes Neck surgery (2009); spleenectomy; Tubal ligation; Hysteroscopy w/D&C (N/A, 02/08/2016); Vaginal hysterectomy (Bilateral, 04/11/2016); Salpingoophorectomy (Bilateral, 04/11/2016); spinal meningitis (12/2016); Carpal tunnel release (Right, 11/22/2017); Abdominal hysterectomy; Colonoscopy with propofol (N/A, 03/27/2018); and Esophagogastroduodenoscopy (egd) with propofol (N/A, 03/27/2018). Family: family history includes Cervical cancer in her sister; Colon cancer in her maternal grandfather; Diabetes in her maternal grandmother and mother; Heart disease in  her mother.  Constitutional Exam  General appearance: Well nourished, well developed, and well hydrated. In no apparent acute distress Vitals:   05/16/18 0833  BP: 135/76  Pulse: 81  Resp: 16  Temp: 98 F (36.7 C)  SpO2: 99%  Weight: 240 lb (108.9 kg)  Height: _0  (1.727 m)   BMI Assessment: Estimated body mass index is 36.49 kg/m as calculated from the following:   Height as of this encounter: _1  (1.727 m).   Weight as of this encounter: 240 lb (108.9 kg).  BMI interpretation table: BMI level Category Range association with higher incidence of chronic pain  <18 kg/m2 Underweight   18.5-24.9 kg/m2 Ideal body weight   25-29.9 kg/m2 Overweight Increased incidence by 20%  30-34.9 kg/m2 Obese (Class I) Increased incidence by 68%  35-39.9 kg/m2 Severe obesity (Class II) Increased incidence by 136%  >40 kg/m2 Extreme obesity (Class III) Increased incidence by 254%   Patient's current BMI Ideal Body weight  Body mass index is 36.49 kg/m. Ideal body weight: 63.9 kg (140 lb 14 oz) Adjusted ideal body weight: 81.9 kg (180 lb 8.4 oz)   BMI Readings from Last 4 Encounters:  05/16/18 36.49 kg/m  03/27/18 36.49 kg/m  02/08/18 36.49 kg/m  11/22/17 31.02 kg/m   Wt Readings from Last 4 Encounters:  05/16/18 240 lb (108.9 kg)  03/27/18 240 lb (108.9 kg)  02/08/18 240 lb (108.9 kg)  11/22/17 204 lb (92.5 kg)  Psych/Mental status: Alert, oriented x 3 (person, place, & time)       Eyes: PERLA Respiratory: No evidence of acute respiratory distress  Cervical Spine Area Exam  Skin & Axial Inspection: No masses, redness, edema, swelling, or associated skin lesions Alignment: Symmetrical Functional ROM: Decreased ROM      Stability: No instability detected Muscle Tone/Strength: Functionally intact. No obvious neuro-muscular anomalies detected. Sensory (Neurological): Movement-associated pain Palpation: Complains of area being tender to palpation              Upper Extremity  (UE) Exam    Side: Right upper extremity  Side: Left upper extremity  Skin & Extremity Inspection: Skin color, temperature, and hair growth are WNL. No peripheral edema or cyanosis. No masses, redness, swelling, asymmetry, or associated skin lesions. No contractures.  Skin & Extremity Inspection: Skin color, temperature, and hair growth are WNL. No peripheral edema or cyanosis. No masses, redness, swelling, asymmetry, or associated skin lesions. No contractures.  Functional ROM: Decreased ROM for shoulder  Functional ROM: Unrestricted ROM  Muscle Tone/Strength: Functionally intact. No obvious neuro-muscular anomalies detected.  Muscle Tone/Strength: Functionally intact. No obvious neuro-muscular anomalies detected.  Sensory (Neurological): Movement-associated pain          Sensory (Neurological): Unimpaired          Palpation: No palpable anomalies              Palpation: No palpable anomalies              Provocative Test(s):  Phalen's test: deferred Tinel's test: deferred Apley's scratch test (touch opposite shoulder):  Action 1 (Across chest): Decreased ROM Action 2 (Overhead): Decreased ROM Action 3 (LB reach): Decreased ROM   Provocative Test(s):  Phalen's test: deferred Tinel's test: deferred Apley's scratch test (touch opposite shoulder):  Action 1 (Across chest): Adequate ROM Action 2 (Overhead): Adequate ROM Action 3 (LB reach): Adequate ROM    Thoracic Spine Area Exam  Skin & Axial Inspection: No masses, redness, or swelling Alignment: Symmetrical Functional ROM: Unrestricted ROM Stability: No instability detected Muscle Tone/Strength: Functionally intact. No obvious neuro-muscular anomalies detected. Sensory (Neurological): Unimpaired Muscle strength & Tone: No palpable anomalies  Lumbar Spine Area Exam  Skin & Axial Inspection: No masses, redness, or swelling Alignment: Symmetrical Functional ROM: Unrestricted ROM       Stability: No instability  detected Muscle Tone/Strength: Functionally intact. No obvious neuro-muscular anomalies detected. Sensory (Neurological): Unimpaired Palpation: No palpable anomalies       Provocative Tests: Lumbar Hyperextension/rotation test: Positive on the right for facet joint pain. Lumbar quadrant test (Kemp's test): deferred today       Lumbar Lateral bending test: deferred today       Patrick's Maneuver: (+) for right hip arthralgia             (Groin Pain) FABER test: deferred today       Thigh-thrust test: deferred today       S-I compression test: deferred today       S-I distraction test: deferred today        Gait & Posture Assessment  Ambulation: Unassisted Gait: Relatively normal for age and body habitus Posture: WNL   Lower Extremity Exam    Side: Right lower extremity  Side: Left lower extremity  Stability: No instability observed          Stability: No instability observed          Skin & Extremity Inspection: Skin color, temperature, and hair growth are WNL. No peripheral edema or cyanosis. No masses, redness, swelling, asymmetry, or associated skin lesions. No contractures.  Skin & Extremity Inspection: Skin color, temperature, and hair growth are WNL. No peripheral edema or cyanosis. No masses, redness, swelling, asymmetry, or associated skin lesions. No contractures.  Functional ROM: Decreased ROM for knee joint          Functional ROM: Decreased ROM for knee joint          Muscle Tone/Strength: Able to Toe-walk & Heel-walk without problems  Muscle Tone/Strength: Able to Toe-walk & Heel-walk without problems  Sensory (Neurological): Movement-associated discomfort  Sensory (Neurological): Movement-associated discomfort  Palpation: No palpable anomalies  Palpation: No palpable anomalies   Assessment & Plan  Primary Diagnosis & Pertinent Problem List: The primary encounter diagnosis was Chronic pain syndrome. Diagnoses of Chronic neck pain (Primary Area of Pain) (Bilateral) (R>L),  Chronic shoulder pain (Secondary Area of Pain) (Right), Chronic knee pain (Tertiary Area of Pain) (Bilateral) (R>L), Disorder of skeletal system, Problems influencing health status, and Pharmacologic therapy were  also pertinent to this visit.  Visit Diagnosis: 1. Chronic pain syndrome   2. Chronic neck pain (Primary Area of Pain) (Bilateral) (R>L)   3. Chronic shoulder pain (Secondary Area of Pain) (Right)   4. Chronic knee pain (Tertiary Area of Pain) (Bilateral) (R>L)   5. Disorder of skeletal system   6. Problems influencing health status   7. Pharmacologic therapy    Problems updated and reviewed during this visit: Problem  Chondromalacia Patellae  Chronic Tension-Type Headache, Intractable  Cervical Post-Laminectomy Syndrome  Cervical Radiculopathy   Time Note: Greater than 50% of the 40 minute(s) of face-to-face time spent with Ms. Arata, was spent in counseling/coordination of care regarding: the appropriate use of the pain scale, Ms. Roland's primary cause of pain, the results of her recent test(s), the significance of each one oth the test(s) anomalies and it's corresponding characteristic pain pattern(s), the treatment plan, treatment alternatives, the risks and possible complications of proposed treatment, medication side effects, the opioid analgesic risks and possible complications, realistic expectations, the goals of pain management (increased in functionality), the medication agreement, the importance of providing [place] with accurate post-procedure information, the patient's responsibilities when it comes to controlled substances and the need to collect and read the AVS material.  Plan of Care  Pharmacotherapy (Medications Ordered): Meds ordered this encounter  Medications  . oxyCODONE (OXY IR/ROXICODONE) 5 MG immediate release tablet    Sig: Take 1 tablet (5 mg total) by mouth daily as needed for severe pain.    Dispense:  30 tablet    Refill:  0    Do not place this  medication, or any other prescription from our practice, on "Automatic Refill". Patient may have prescription filled one day early if pharmacy is closed on scheduled refill date. Do not fill until: 05/16/18 To last until: 06/15/18    Procedure Orders     SHOULDER INJECTION Lab Orders  No laboratory test(s) ordered today   Imaging Orders  No imaging studies ordered today   Referral Orders  No referral(s) requested today    Pharmacological management options:  Opioid Analgesics: We'll take over management today. See above orders Membrane stabilizer: We have discussed the possibility of optimizing this mode of therapy, if tolerated Muscle relaxant: We have discussed the possibility of a trial NSAID: We have discussed the possibility of a trial Other analgesic(s): To be determined at a later time   Interventional management options: Planned, scheduled, and/or pending:    Diagnostic right shoulder intra-articular joint injection #1 under fluoro and IV sedation   Considering:   Diagnostic bilateral cervical facet nerve block #1  Possible bilateral cervical facet RFA  Diagnostic right shoulder intra-articular joint injection  Diagnostic right suprascapular nerve block  Possible right suprascapular RFA  Diagnostic bilateral intra-articular knee injection  Possible series of 5 intra-articular bilateral Hyal[place] knee injection series Diagnostic bilateral genicular nerve block  Possible bilateral genicular RFA    PRN Procedures:   None at this time   Provider-requested follow-up: Return for Procedure (w/ sedation): (R) IA Shoulder inj #1.  Future Appointments  Date Time Provider [place]  05/29/2018  8:15 AM Milinda Pointer, MD [facility] None    Primary Care Physician: Marinda Elk, MD Location: [facility] Note by: Gaspar Cola, MD Date: 05/16/2018; Time: 9:17 AM

## 2018-05-16 ENCOUNTER — Ambulatory Visit: Payer: Medicaid Other | Attending: Pain Medicine | Admitting: Pain Medicine

## 2018-05-16 ENCOUNTER — Encounter: Payer: Self-pay | Admitting: Pain Medicine

## 2018-05-16 ENCOUNTER — Other Ambulatory Visit: Payer: Self-pay

## 2018-05-16 VITALS — BP 135/76 | HR 81 | Temp 98.0°F | Resp 16 | Ht 68.0 in | Wt 240.0 lb

## 2018-05-16 DIAGNOSIS — E669 Obesity, unspecified: Secondary | ICD-10-CM | POA: Insufficient documentation

## 2018-05-16 DIAGNOSIS — E119 Type 2 diabetes mellitus without complications: Secondary | ICD-10-CM | POA: Diagnosis not present

## 2018-05-16 DIAGNOSIS — M961 Postlaminectomy syndrome, not elsewhere classified: Secondary | ICD-10-CM | POA: Insufficient documentation

## 2018-05-16 DIAGNOSIS — Z7982 Long term (current) use of aspirin: Secondary | ICD-10-CM | POA: Diagnosis not present

## 2018-05-16 DIAGNOSIS — M5412 Radiculopathy, cervical region: Secondary | ICD-10-CM | POA: Insufficient documentation

## 2018-05-16 DIAGNOSIS — Z79891 Long term (current) use of opiate analgesic: Secondary | ICD-10-CM | POA: Insufficient documentation

## 2018-05-16 DIAGNOSIS — Z87891 Personal history of nicotine dependence: Secondary | ICD-10-CM | POA: Insufficient documentation

## 2018-05-16 DIAGNOSIS — Z981 Arthrodesis status: Secondary | ICD-10-CM | POA: Insufficient documentation

## 2018-05-16 DIAGNOSIS — E7800 Pure hypercholesterolemia, unspecified: Secondary | ICD-10-CM | POA: Diagnosis not present

## 2018-05-16 DIAGNOSIS — M25561 Pain in right knee: Secondary | ICD-10-CM | POA: Diagnosis not present

## 2018-05-16 DIAGNOSIS — G8929 Other chronic pain: Secondary | ICD-10-CM

## 2018-05-16 DIAGNOSIS — M25562 Pain in left knee: Secondary | ICD-10-CM | POA: Diagnosis not present

## 2018-05-16 DIAGNOSIS — F419 Anxiety disorder, unspecified: Secondary | ICD-10-CM | POA: Diagnosis not present

## 2018-05-16 DIAGNOSIS — K219 Gastro-esophageal reflux disease without esophagitis: Secondary | ICD-10-CM | POA: Diagnosis not present

## 2018-05-16 DIAGNOSIS — Z6839 Body mass index (BMI) 39.0-39.9, adult: Secondary | ICD-10-CM | POA: Insufficient documentation

## 2018-05-16 DIAGNOSIS — M25511 Pain in right shoulder: Secondary | ICD-10-CM | POA: Insufficient documentation

## 2018-05-16 DIAGNOSIS — G44221 Chronic tension-type headache, intractable: Secondary | ICD-10-CM | POA: Diagnosis not present

## 2018-05-16 DIAGNOSIS — M899 Disorder of bone, unspecified: Secondary | ICD-10-CM

## 2018-05-16 DIAGNOSIS — M2240 Chondromalacia patellae, unspecified knee: Secondary | ICD-10-CM | POA: Diagnosis not present

## 2018-05-16 DIAGNOSIS — Z7951 Long term (current) use of inhaled steroids: Secondary | ICD-10-CM | POA: Diagnosis not present

## 2018-05-16 DIAGNOSIS — E0590 Thyrotoxicosis, unspecified without thyrotoxic crisis or storm: Secondary | ICD-10-CM | POA: Insufficient documentation

## 2018-05-16 DIAGNOSIS — Z789 Other specified health status: Secondary | ICD-10-CM | POA: Diagnosis not present

## 2018-05-16 DIAGNOSIS — M542 Cervicalgia: Secondary | ICD-10-CM | POA: Diagnosis not present

## 2018-05-16 DIAGNOSIS — J449 Chronic obstructive pulmonary disease, unspecified: Secondary | ICD-10-CM | POA: Insufficient documentation

## 2018-05-16 DIAGNOSIS — E038 Other specified hypothyroidism: Secondary | ICD-10-CM | POA: Diagnosis not present

## 2018-05-16 DIAGNOSIS — Z79899 Other long term (current) drug therapy: Secondary | ICD-10-CM | POA: Diagnosis not present

## 2018-05-16 DIAGNOSIS — Z79890 Hormone replacement therapy (postmenopausal): Secondary | ICD-10-CM | POA: Insufficient documentation

## 2018-05-16 DIAGNOSIS — G894 Chronic pain syndrome: Secondary | ICD-10-CM | POA: Diagnosis not present

## 2018-05-16 DIAGNOSIS — Q828 Other specified congenital malformations of skin: Secondary | ICD-10-CM | POA: Insufficient documentation

## 2018-05-16 DIAGNOSIS — F3189 Other bipolar disorder: Secondary | ICD-10-CM | POA: Insufficient documentation

## 2018-05-16 MED ORDER — OXYCODONE HCL 5 MG PO TABS: 5.0000 mg | ORAL_TABLET | Freq: Every day | ORAL | 0 refills | Status: DC | PRN

## 2018-05-16 NOTE — Patient Instructions (Addendum)
____________________________________________________________________________________________  Pain Scale  Introduction: The pain score used by this practice is the Verbal Numerical Rating Scale (VNRS-11). This is an 11-point scale. It is for adults and children 10 years or older. There are significant differences in how the pain score is reported, used, and applied. Forget everything you learned in the past and learn this scoring system.  General Information: The scale should reflect your current level of pain. Unless you are specifically asked for the level of your worst pain, or your average pain. If you are asked for one of these two, then it should be understood that it is over the past 24 hours.  Basic Activities of Daily Living (ADL): Personal hygiene, dressing, eating, transferring, and using restroom.  Instructions: Most patients tend to report their level of pain as a combination of two factors, their physical pain and their psychosocial pain. This last one is also known as "suffering" and it is reflection of how physical pain affects you socially and psychologically. From now on, report them separately. From this point on, when asked to report your pain level, report only your physical pain. Use the following table for reference.  Pain Clinic Pain Levels (0-5/10)  Pain Level Score  Description  No Pain 0   Mild pain 1 Nagging, annoying, but does not interfere with basic activities of daily living (ADL). Patients are able to eat, bathe, get dressed, toileting (being able to get on and off the toilet and perform personal hygiene functions), transfer (move in and out of bed or a chair without assistance), and maintain continence (able to control bladder and bowel functions). Blood pressure and heart rate are unaffected. A normal heart rate for a healthy adult ranges from 60 to 100 bpm (beats per minute).   Mild to moderate pain 2 Noticeable and distracting. Impossible to hide from other  people. More frequent flare-ups. Still possible to adapt and function close to normal. It can be very annoying and may have occasional stronger flare-ups. With discipline, patients may get used to it and adapt.   Moderate pain 3 Interferes significantly with activities of daily living (ADL). It becomes difficult to feed, bathe, get dressed, get on and off the toilet or to perform personal hygiene functions. Difficult to get in and out of bed or a chair without assistance. Very distracting. With effort, it can be ignored when deeply involved in activities.   Moderately severe pain 4 Impossible to ignore for more than a few minutes. With effort, patients may still be able to manage work or participate in some social activities. Very difficult to concentrate. Signs of autonomic nervous system discharge are evident: dilated pupils (mydriasis); mild sweating (diaphoresis); sleep interference. Heart rate becomes elevated (>115 bpm). Diastolic blood pressure (lower number) rises above 100 mmHg. Patients find relief in laying down and not moving.   Severe pain 5 Intense and extremely unpleasant. Associated with frowning face and frequent crying. Pain overwhelms the senses.  Ability to do any activity or maintain social relationships becomes significantly limited. Conversation becomes difficult. Pacing back and forth is common, as getting into a comfortable position is nearly impossible. Pain wakes you up from deep sleep. Physical signs will be obvious: pupillary dilation; increased sweating; goosebumps; brisk reflexes; cold, clammy hands and feet; nausea, vomiting or dry heaves; loss of appetite; significant sleep disturbance with inability to fall asleep or to remain asleep. When persistent, significant weight loss is observed due to the complete loss of appetite and sleep deprivation.  Blood   pressure and heart rate becomes significantly elevated. Caution: If elevated blood pressure triggers a pounding headache  associated with blurred vision, then the patient should immediately seek attention at an urgent or emergency care unit, as these may be signs of an impending stroke.    Emergency Department Pain Levels (6-10/10)  Emergency Room Pain 6 Severely limiting. Requires emergency care and should not be seen or managed at an outpatient pain management facility. Communication becomes difficult and requires great effort. Assistance to reach the emergency department may be required. Facial flushing and profuse sweating along with potentially dangerous increases in heart rate and blood pressure will be evident.   Distressing pain 7 Self-care is very difficult. Assistance is required to transport, or use restroom. Assistance to reach the emergency department will be required. Tasks requiring coordination, such as bathing and getting dressed become very difficult.   Disabling pain 8 Self-care is no longer possible. At this level, pain is disabling. The individual is unable to do even the most "basic" activities such as walking, eating, bathing, dressing, transferring to a bed, or toileting. Fine motor skills are lost. It is difficult to think clearly.   Incapacitating pain 9 Pain becomes incapacitating. Thought processing is no longer possible. Difficult to remember your own name. Control of movement and coordination are lost.   The worst pain imaginable 10 At this level, most patients pass out from pain. When this level is reached, collapse of the autonomic nervous system occurs, leading to a sudden drop in blood pressure and heart rate. This in turn results in a temporary and dramatic drop in blood flow to the brain, leading to a loss of consciousness. Fainting is one of the body's self defense mechanisms. Passing out puts the brain in a calmed state and causes it to shut down for a while, in order to begin the healing process.    Summary: 1. Refer to this scale when providing us with your pain level. 2. Be  accurate and careful when reporting your pain level. This will help with your care. 3. Over-reporting your pain level will lead to loss of credibility. 4. Even a level of 1/10 means that there is pain and will be treated at our facility. 5. High, inaccurate reporting will be documented as "Symptom Exaggeration", leading to loss of credibility and suspicions of possible secondary gains such as obtaining more narcotics, or wanting to appear disabled, for fraudulent reasons. 6. Only pain levels of 5 or below will be seen at our facility. 7. Pain levels of 6 and above will be sent to the Emergency Department and the appointment cancelled. ____________________________________________________________________________________________   ____________________________________________________________________________________________  Preparing for Procedure with Sedation  Instructions: . Oral Intake: Do not eat or drink anything for at least 8 hours prior to your procedure. . Transportation: Public transportation is not allowed. Bring an adult driver. The driver must be physically present in our waiting room before any procedure can be started. . Physical Assistance: Bring an adult physically capable of assisting you, in the event you need help. This adult should keep you company at home for at least 6 hours after the procedure. . Blood Pressure Medicine: Take your blood pressure medicine with a sip of water the morning of the procedure. . Blood thinners:  . Diabetics on insulin: Notify the staff so that you can be scheduled 1st case in the morning. If your diabetes requires high dose insulin, take only  of your normal insulin dose the morning of the procedure and   notify the staff that you have done so. . Preventing infections: Shower with an antibacterial soap the morning of your procedure. . Build-up your immune system: Take 1000 mg of Vitamin C with every meal (3 times a day) the day prior to your  procedure. Marland Kitchen Antibiotics: Inform the staff if you have a condition or reason that requires you to take antibiotics before dental procedures. . Pregnancy: If you are pregnant, call and cancel the procedure. . Sickness: If you have a cold, fever, or any active infections, call and cancel the procedure. . Arrival: You must be in the facility at least 30 minutes prior to your scheduled procedure. . Children: Do not bring children with you. . Dress appropriately: Bring dark clothing that you would not mind if they get stained. . Valuables: Do not bring any jewelry or valuables.  Procedure appointments are reserved for interventional treatments only. Marland Kitchen No Prescription Refills. . No medication changes will be discussed during procedure appointments. . No disability issues will be discussed.  Remember:  Regular Business hours are:  Monday to Thursday 8:00 AM to 4:00 PM  Provider's Schedule: Milinda Pointer, MD:  Procedure days: Tuesday and Thursday 7:30 AM to 4:00 PM  Gillis Santa, MD:  Procedure days: Monday and Wednesday 7:30 AM to 4:00 PM ____________________________________________________________________________________________  ____________________________________________________________________________________________  Medication Rules  Applies to: All patients receiving prescriptions (written or electronic).  Pharmacy of record: Pharmacy where electronic prescriptions will be sent. If written prescriptions are taken to a different pharmacy, please inform the nursing staff. The pharmacy listed in the electronic medical record should be the one where you would like electronic prescriptions to be sent.  Prescription refills: Only during scheduled appointments. Applies to both, written and electronic prescriptions.  NOTE: The following applies primarily to controlled substances (Opioid* Pain Medications).   Patient's responsibilities: 1. Pain Pills: Bring all pain pills to every  appointment (except for procedure appointments). 2. Pill Bottles: Bring pills in original pharmacy bottle. Always bring newest bottle. Bring bottle, even if empty. 3. Medication refills: You are responsible for knowing and keeping track of what medications you need refilled. The day before your appointment, write a list of all prescriptions that need to be refilled. Bring that list to your appointment and give it to the admitting nurse. Prescriptions will be written only during appointments. If you forget a medication, it will not be "Called in", "Faxed", or "electronically sent". You will need to get another appointment to get these prescribed. 4. Prescription Accuracy: You are responsible for carefully inspecting your prescriptions before leaving our office. Have the discharge nurse carefully go over each prescription with you, before taking them home. Make sure that your name is accurately spelled, that your address is correct. Check the name and dose of your medication to make sure it is accurate. Check the number of pills, and the written instructions to make sure they are clear and accurate. Make sure that you are given enough medication to last until your next medication refill appointment. 5. Taking Medication: Take medication as prescribed. Never take more pills than instructed. Never take medication more frequently than prescribed. Taking less pills or less frequently is permitted and encouraged, when it comes to controlled substances (written prescriptions).  6. Inform other Doctors: Always inform, all of your healthcare providers, of all the medications you take. 7. Pain Medication from other Providers: You are not allowed to accept any additional pain medication from any other Doctor or Healthcare provider. There are two exceptions to  this rule. (see below) In the event that you require additional pain medication, you are responsible for notifying us, as stated below. 8. Medication Agreement: You  are responsible for carefully reading and following our Medication Agreement. This must be signed before receiving any prescriptions from our practice. Safely store a copy of your signed Agreement. Violations to the Agreement will result in no further prescriptions. (Additional copies of our Medication Agreement are available upon request.) 9. Laws, Rules, & Regulations: All patients are expected to follow all Federal and [employer], [employer], Rules, [employer]. Ignorance of the Laws does not constitute a valid excuse. The use of any illegal substances is prohibited. 10. Adopted CDC guidelines & recommendations: Target dosing levels will be at or below 60 MME/day. Use of benzodiazepines** is not recommended.  Exceptions: There are only two exceptions to the rule of not receiving pain medications from other Healthcare Providers. 1. Exception #1 (Emergencies): In the event of an emergency (i.e.: accident requiring emergency care), you are allowed to receive additional pain medication. However, you are responsible for: As soon as you are able, call our office [phone], at any time of the day or night, and leave a message stating your name, the date and nature of the emergency, and the name and dose of the medication prescribed. In the event that your call is answered by a member of our staff, make sure to document and save the date, time, and the name of the person that took your information.  2. Exception #2 (Planned Surgery): In the event that you are scheduled by another doctor or dentist to have any type of surgery or procedure, you are allowed (for a period no longer than 30 days), to receive additional pain medication, for the acute post-op pain. However, in this case, you are responsible for picking up a copy of our "Post-op Pain Management for Surgeons" handout, and giving it to your surgeon or dentist. This document is available at our office, and does not require an appointment to obtain it.  Simply go to our office during business hours (Monday-Thursday from 8:00 AM to 4:00 PM) (Friday 8:00 AM to 12:00 Noon) or if you have a scheduled appointment with [place], prior to your surgery, and ask for it by name. In addition, you will need to provide [place] with your name, name of your surgeon, type of surgery, and date of procedure or surgery.  *Opioid medications include: morphine, codeine, oxycodone, oxymorphone, hydrocodone, hydromorphone, meperidine, tramadol, tapentadol, buprenorphine, fentanyl, methadone. **Benzodiazepine medications include: diazepam (Valium), alprazolam (Xanax), clonazepam (Klonopine), lorazepam (Ativan), clorazepate (Tranxene), chlordiazepoxide (Librium), estazolam (Prosom), oxazepam (Serax), temazepam (Restoril), triazolam (Halcion) (Last updated: 02/22/2018) ____________________________________________________________________________________________   ____________________________________________________________________________________________  Medication Recommendations and Reminders  Applies to: All patients receiving prescriptions (written and/or electronic).  Medication Rules & Regulations: These rules and regulations exist for your safety and that of others. They are not flexible and neither are we. Dismissing or ignoring them will be considered "non-compliance" with medication therapy, resulting in complete and irreversible termination of such therapy. (See document titled "Medication Rules" for more details.) In all conscience, because of safety reasons, we cannot continue providing a therapy where the patient does not follow instructions.  Pharmacy of record:   Definition: This is the pharmacy where your electronic prescriptions will be sent.   We do not endorse any particular pharmacy.  You are not restricted in your choice of pharmacy.  The pharmacy listed in the electronic medical record should be the one where you  want electronic prescriptions to be  sent.  If you choose to change pharmacy, simply notify our nursing staff of your choice of new pharmacy.  Recommendations:  Keep all of your pain medications in a safe place, under lock and key, even if you live alone.   After you fill your prescription, take 1 week's worth of pills and put them away in a safe place. You should keep a separate, properly labeled bottle for this purpose. The remainder should be kept in the original bottle. Use this as your primary supply, until it runs out. Once it's gone, then you know that you have 1 week's worth of medicine, and it is time to come in for a prescription refill. If you do this correctly, it is unlikely that you will ever run out of medicine.  To make sure that the above recommendation works, it is very important that you make sure your medication refill appointments are scheduled at least 1 week before you run out of medicine. To do this in an effective manner, make sure that you do not leave the office without scheduling your next medication management appointment. Always ask the nursing staff to show you in your prescription , when your medication will be running out. Then arrange for the receptionist to get you a return appointment, at least 7 days before you run out of medicine. Do not wait until you have 1 or 2 pills left, to come in. This is very poor planning and does not take into consideration that we may need to cancel appointments due to bad weather, sickness, or emergencies affecting our staff.  Prescription refills and/or changes in medication(s):   Prescription refills, and/or changes in dose or medication, will be conducted only during scheduled medication management appointments. (Applies to both, written and electronic prescriptions.)  No refills on procedure days. No medication will be changed or started on procedure days. No changes, adjustments, and/or refills will be conducted on a procedure day. Doing so will interfere with the  diagnostic portion of the procedure.  No phone refills. No medications will be "called into the pharmacy".  No Fax refills.  No weekend refills.  No Holliday refills.  No after hours refills.  Remember:  Business hours are:  Monday to Thursday 8:00 AM to 4:00 PM Provider's Schedule: Dionisio David, NP - Appointments are:  Medication management: Monday to Thursday 8:00 AM to 4:00 PM Milinda Pointer, MD - Appointments are:  Medication management: Monday and Wednesday 8:00 AM to 4:00 PM Procedure day: Tuesday and Thursday 7:30 AM to 4:00 PM Gillis Santa, MD - Appointments are:  Medication management: Tuesday and Thursday 8:00 AM to 4:00 PM Procedure day: Monday and Wednesday 7:30 AM to 4:00 PM (Last update: 02/22/2018) ____________________________________________________________________________________________   ____________________________________________________________________________________________  CANNABIDIOL (AKA: CBD Oil or Pills)  Applies to: All patients receiving prescriptions of controlled substances (written and/or electronic).  General Information: Cannabidiol (CBD) was discovered in 45. It is one of some 113 identified cannabinoids in cannabis (Marijuana) plants, accounting for up to 40% of the plant's extract. As of 2018, preliminary clinical research on cannabidiol included studies of anxiety, cognition, movement disorders, and pain.  Cannabidiol is consummed in multiple ways, including inhalation of cannabis smoke or vapor, as an aerosol spray into the cheek, and by mouth. It may be supplied as CBD oil containing CBD as the active ingredient (no added tetrahydrocannabinol (THC) or terpenes), a full-plant CBD-dominant hemp extract oil, capsules, dried cannabis, or as a liquid solution. CBD is  thought not have the same psychoactivity as THC, and may affect the actions of THC. Studies suggest that CBD may interact with different biological targets, including  cannabinoid receptors and other neurotransmitter receptors. As of 2018 the mechanism of action for its biological effects has not been determined.  In the [place], cannabidiol has a limited approval by the Food and Drug Administration (FDA) for treatment of only two types of epilepsy disorders. The side effects of long-term use of the drug include somnolence, decreased appetite, diarrhea, fatigue, malaise, weakness, sleeping problems, and others.  CBD remains a Schedule I drug prohibited for any use.  Legality: Some manufacturers ship CBD products nationally, an illegal action which the FDA has not enforced in 2018, with CBD remaining the subject of an FDA investigational new drug evaluation, and is not considered legal as a dietary supplement or food ingredient as of December 2018. Federal illegality has made it difficult historically to conduct research on CBD. CBD is openly sold in head shops and health food stores in some states where such sales have not been explicitly legalized.  Warning: Because it is not FDA approved for general use or treatment of pain, it is not required to undergo the same manufacturing controls as prescription drugs.  This means that the available cannabidiol (CBD) may be contaminated with THC.  If this is the case, it will trigger a positive urine drug screen (UDS) test for cannabinoids (Marijuana).  Because a positive UDS for illicit substances is a violation of our medication agreement, your opioid analgesics (pain medicine) may be permanently discontinued. (Last update: 03/15/2018) ____________________________________________________________________________________________ Monica Terry have been given Rx for Oxycodone to last until 06/15/2018               Pre-Procedure Instructions    Trigger Point Injection Trigger points are areas where you have pain. A trigger point injection is a shot given in the trigger point to help relieve pain for a few days to a few  months. Common places for trigger points include:  The neck.  The shoulders.  The upper back.  The lower back.  A trigger point injection will not cure long-lasting (chronic) pain permanently. These injections do not always work for every person, but for some people they can help to relieve pain for a few days to a few months. Tell a health care provider about:  Any allergies you have.  All medicines you are taking, including vitamins, herbs, eye drops, creams, and over-the-counter medicines.  Any problems you or family members have had with anesthetic medicines.  Any blood disorders you have.  Any surgeries you have had.  Any medical conditions you have. What are the risks? Generally, this is a safe procedure. However, problems may occur, including:  Infection.  Bleeding.  Allergic reaction to the injected medicine.  Irritation of the skin around the injection site.  What happens before the procedure?  Ask your health care provider about changing or stopping your regular medicines. This is especially important if you are taking diabetes medicines or blood thinners. What happens during the procedure?  Your health care provider will feel for trigger points. A marker may be used to circle the area for the injection.  The skin over the trigger point will be washed with a germ-killing (antiseptic) solution.  A thin needle is used for the shot. You may feel pain or a twitching feeling when the needle enters the trigger point.  A numbing solution may be injected into the trigger point.  Sometimes a medicine to keep down swelling, redness, and warmth (inflammation) is also injected.  Your health care provider may move the needle around the area where the trigger point is located until the tightness and twitching goes away.  After the injection, your health care provider may put gentle pressure over the injection site.  The injection site will be covered with a bandage  (dressing). The procedure may vary among health care providers and hospitals. What happens after the procedure?  The dressing can be taken off in a few hours or as told by your health care provider.  You may feel sore and stiff for 1-2 days. This information is not intended to replace advice given to you by your health care provider. Make sure you discuss any questions you have with your health care provider. Document Released: 12/01/2011 Document Revised: 08/14/2016 Document Reviewed: 06/01/2015 Elsevier Interactive Patient Education  2018 [place]  What are the risk, side effects and possible complications? Generally speaking, most procedures are safe.  However, with any procedure there are risks, side effects, and the possibility of complications.  The risks and complications are dependent upon the sites that are lesioned, or the type of nerve block to be performed.  The closer the procedure is to the spine, the more serious the risks are.  Great care is taken when placing the radio frequency needles, block needles or lesioning probes, but sometimes complications can occur. 1. Infection: Any time there is an injection through the skin, there is a risk of infection.  This is why sterile conditions are used for these blocks.  There are four possible types of infection. 1. Localized skin infection. 2. Central Nervous System Infection-This can be in the form of Meningitis, which can be deadly. 3. Epidural Infections-This can be in the form of an epidural abscess, which can cause pressure inside of the spine, causing compression of the spinal cord with subsequent paralysis. This would require an emergency surgery to decompress, and there are no guarantees that the patient would recover from the paralysis. 4. Discitis-This is an infection of the intervertebral discs.  It occurs in about 1% of discography procedures.  It is difficult to treat and it may lead to  surgery.        2. Pain: the needles have to go through skin and soft tissues, will cause soreness.       3. Damage to internal structures:  The nerves to be lesioned may be near blood vessels or    other nerves which can be potentially damaged.       4. Bleeding: Bleeding is more common if the patient is taking blood thinners such as  aspirin, Coumadin, Ticiid, Plavix, etc., or if he/she have some genetic predisposition  such as hemophilia. Bleeding into the spinal canal can cause compression of the spinal  cord with subsequent paralysis.  This would require an emergency surgery to  decompress and there are no guarantees that the patient would recover from the  paralysis.       5. Pneumothorax:  Puncturing of a lung is a possibility, every time a needle is introduced in  the area of the chest or upper back.  Pneumothorax refers to free air around the  collapsed lung(s), inside of the thoracic cavity (chest cavity).  Another two possible  complications related to a similar event would include: Hemothorax and Chylothorax.   These are variations of the Pneumothorax, where instead of air around the  collapsed  lung(s), you may have blood or chyle, respectively.       6. Spinal headaches: They may occur with any procedures in the area of the spine.       7. Persistent CSF (Cerebro-Spinal Fluid) leakage: This is a rare problem, but may occur  with prolonged intrathecal or epidural catheters either due to the formation of a fistulous  track or a dural tear.       8. Nerve damage: By working so close to the spinal cord, there is always a possibility of  nerve damage, which could be as serious as a permanent spinal cord injury with  paralysis.       9. Death:  Although rare, severe deadly allergic reactions known as "Anaphylactic  reaction" can occur to any of the medications used.      10. Worsening of the symptoms:  We can always make thing worse.  What are the chances of something like this  happening? Chances of any of this occuring are extremely low.  By statistics, you have more of a chance of getting killed in a motor vehicle accident: while driving to the hospital than any of the above occurring .  Nevertheless, you should be aware that they are possibilities.  In general, it is similar to taking a shower.  Everybody knows that you can slip, hit your head and get killed.  Does that mean that you should not shower again?  Nevertheless always keep in mind that statistics do not mean anything if you happen to be on the wrong side of them.  Even if a procedure has a 1 (one) in a 1,000,000 (million) chance of going wrong, it you happen to be that one..Also, keep in mind that by statistics, you have more of a chance of having something go wrong when taking medications.  Who should not have this procedure? If you are on a blood thinning medication (e.g. Coumadin, Plavix, see list of "Blood Thinners"), or if you have an active infection going on, you should not have the procedure.  If you are taking any blood thinners, please inform your physician.  How should I prepare for this procedure?  Do not eat or drink anything at least six hours prior to the procedure.  Bring a driver with you .  It cannot be a taxi.  Come accompanied by an adult that can drive you back, and that is strong enough to help you if your legs get weak or numb from the local anesthetic.  Take all of your medicines the morning of the procedure with just enough water to swallow them.  If you have diabetes, make sure that you are scheduled to have your procedure done first thing in the morning, whenever possible.  If you have diabetes, take only half of your insulin dose and notify our nurse that you have done so as soon as you arrive at the clinic.  If you are diabetic, but only take blood sugar pills (oral hypoglycemic), then do not take them on the morning of your procedure.  You may take them after you have had the  procedure.  Do not take aspirin or any aspirin-containing medications, at least eleven (11) days prior to the procedure.  They may prolong bleeding.  Wear loose fitting clothing that may be easy to take off and that you would not mind if it got stained with Betadine or blood.  Do not wear any jewelry or perfume  Remove any nail coloring.  It will interfere with some of our monitoring equipment.  NOTE: Remember that this is not meant to be interpreted as a complete list of all possible complications.  Unforeseen problems may occur.  BLOOD THINNERS The following drugs contain aspirin or other products, which can cause increased bleeding during surgery and should not be taken for 2 weeks prior to and 1 week after surgery.  If you should need take something for relief of minor pain, you may take acetaminophen which is found in Tylenol,m Datril, Anacin-3 and Panadol. It is not blood thinner. The products listed below are.  Do not take any of the products listed below in addition to any listed on your instruction sheet.  A.P.C or A.P.C with Codeine Codeine Phosphate Capsules #3 Ibuprofen Ridaura  ABC compound Congesprin Imuran rimadil  Advil Cope Indocin Robaxisal  Alka-Seltzer Effervescent Pain Reliever and Antacid Coricidin or Coricidin-D  Indomethacin Rufen  Alka-Seltzer plus Cold Medicine Cosprin Ketoprofen S-A-C Tablets  Anacin Analgesic Tablets or Capsules Coumadin Korlgesic Salflex  Anacin Extra Strength Analgesic tablets or capsules CP-2 Tablets Lanoril Salicylate  Anaprox Cuprimine Capsules Levenox Salocol  Anexsia-D Dalteparin Magan Salsalate  Anodynos Darvon compound Magnesium Salicylate Sine-off  Ansaid Dasin Capsules Magsal Sodium Salicylate  Anturane Depen Capsules Marnal Soma  APF Arthritis pain formula Dewitt's Pills Measurin Stanback  Argesic Dia-Gesic Meclofenamic Sulfinpyrazone  Arthritis Bayer Timed Release Aspirin Diclofenac Meclomen Sulindac  Arthritis pain formula  Anacin Dicumarol Medipren Supac  Analgesic (Safety coated) Arthralgen Diffunasal Mefanamic Suprofen  Arthritis Strength Bufferin Dihydrocodeine Mepro Compound Suprol  Arthropan liquid Dopirydamole Methcarbomol with Aspirin Synalgos  ASA tablets/Enseals Disalcid Micrainin Tagament  Ascriptin Doan's Midol Talwin  Ascriptin A/D Dolene Mobidin Tanderil  Ascriptin Extra Strength Dolobid Moblgesic Ticlid  Ascriptin with Codeine Doloprin or Doloprin with Codeine Momentum Tolectin  Asperbuf Duoprin Mono-gesic Trendar  Aspergum Duradyne Motrin or Motrin IB Triminicin  Aspirin plain, buffered or enteric coated Durasal Myochrisine Trigesic  Aspirin Suppositories Easprin Nalfon Trillsate  Aspirin with Codeine Ecotrin Regular or Extra Strength Naprosyn Uracel  Atromid-S Efficin Naproxen Ursinus  Auranofin Capsules Elmiron Neocylate Vanquish  Axotal Emagrin Norgesic Verin  Azathioprine Empirin or Empirin with Codeine Normiflo Vitamin E  Azolid Emprazil Nuprin Voltaren  Bayer Aspirin plain, buffered or children's or timed BC Tablets or powders Encaprin Orgaran Warfarin Sodium  Buff-a-Comp Enoxaparin Orudis Zorpin  Buff-a-Comp with Codeine Equegesic Os-Cal-Gesic   Buffaprin Excedrin plain, buffered or Extra Strength Oxalid   Bufferin Arthritis Strength Feldene Oxphenbutazone   Bufferin plain or Extra Strength Feldene Capsules Oxycodone with Aspirin   Bufferin with Codeine Fenoprofen Fenoprofen Pabalate or Pabalate-SF   Buffets II Flogesic Panagesic   Buffinol plain or Extra Strength Florinal or Florinal with Codeine Panwarfarin   Buf-Tabs Flurbiprofen Penicillamine   Butalbital Compound Four-way cold tablets Penicillin   Butazolidin Fragmin Pepto-Bismol   Carbenicillin Geminisyn Percodan   Carna Arthritis Reliever Geopen Persantine   Carprofen Gold's salt Persistin   Chloramphenicol Goody's Phenylbutazone   Chloromycetin Haltrain Piroxlcam   Clmetidine heparin Plaquenil   Cllnoril Hyco-pap  Ponstel   Clofibrate Hydroxy chloroquine Propoxyphen         Before stopping any of these medications, be sure to consult the physician who ordered them.  Some, such as Coumadin (Warfarin) are ordered to prevent or treat serious conditions such as "deep thrombosis", "pumonary embolisms", and other heart problems.  The amount of time that you may need off of the medication may also vary with the medication and the reason for which you  were taking it.  If you are taking any of these medications, please make sure you notify your pain physician before you undergo any procedures.         ____________________________________________________________________________________________  Preparing for Procedure with Sedation  Instructions: . Oral Intake: Do not eat or drink anything for at least 8 hours prior to your procedure. . Transportation: Public transportation is not allowed. Bring an adult driver. The driver must be physically present in our waiting room before any procedure can be started. Marland Kitchen Physical Assistance: Bring an adult physically capable of assisting you, in the event you need help. This adult should keep you company at home for at least 6 hours after the procedure. . Blood Pressure Medicine: Take your blood pressure medicine with a sip of water the morning of the procedure. . Blood thinners:  . Diabetics on insulin: Notify the staff so that you can be scheduled 1st case in the morning. If your diabetes requires high dose insulin, take only  of your normal insulin dose the morning of the procedure and notify the staff that you have done so. . Preventing infections: Shower with an antibacterial soap the morning of your procedure. . Build-up your immune system: Take 1000 mg of Vitamin C with every meal (3 times a day) the day prior to your procedure. Marland Kitchen Antibiotics: Inform the staff if you have a condition or reason that requires you to take antibiotics before dental  procedures. . Pregnancy: If you are pregnant, call and cancel the procedure. . Sickness: If you have a cold, fever, or any active infections, call and cancel the procedure. . Arrival: You must be in the facility at least 30 minutes prior to your scheduled procedure. . Children: Do not bring children with you. . Dress appropriately: Bring dark clothing that you would not mind if they get stained. . Valuables: Do not bring any jewelry or valuables.  Procedure appointments are reserved for interventional treatments only. Marland Kitchen No Prescription Refills. . No medication changes will be discussed during procedure appointments. . No disability issues will be discussed.  Remember:  Regular Business hours are:  Monday to Thursday 8:00 AM to 4:00 PM  Provider's Schedule: Milinda Pointer, MD:  Procedure days: Tuesday and Thursday 7:30 AM to 4:00 PM  Gillis Santa, MD:  Procedure days: Monday and Wednesday 7:30 AM to 4:00 PM ____________________________________________________________________________________________

## 2018-05-29 ENCOUNTER — Ambulatory Visit
Admission: RE | Admit: 2018-05-29 | Discharge: 2018-05-29 | Disposition: A | Discharge status: Home / Self Care | Payer: Medicaid Other | Source: Ambulatory Visit | Attending: Pain Medicine | Admitting: Pain Medicine

## 2018-05-29 ENCOUNTER — Encounter: Payer: Self-pay | Admitting: Pain Medicine

## 2018-05-29 ENCOUNTER — Other Ambulatory Visit: Payer: Self-pay

## 2018-05-29 ENCOUNTER — Ambulatory Visit: Payer: Medicaid Other | Admitting: Pain Medicine

## 2018-05-29 VITALS — BP 115/65 | HR 75 | Temp 97.7°F | Resp 22 | Ht 68.0 in | Wt 220.0 lb

## 2018-05-29 DIAGNOSIS — Z79899 Other long term (current) drug therapy: Secondary | ICD-10-CM | POA: Diagnosis not present

## 2018-05-29 DIAGNOSIS — M25511 Pain in right shoulder: Secondary | ICD-10-CM

## 2018-05-29 DIAGNOSIS — M7551 Bursitis of right shoulder: Secondary | ICD-10-CM | POA: Diagnosis not present

## 2018-05-29 DIAGNOSIS — G8929 Other chronic pain: Secondary | ICD-10-CM | POA: Diagnosis not present

## 2018-05-29 DIAGNOSIS — Z7951 Long term (current) use of inhaled steroids: Secondary | ICD-10-CM | POA: Insufficient documentation

## 2018-05-29 DIAGNOSIS — M19011 Primary osteoarthritis, right shoulder: Secondary | ICD-10-CM | POA: Diagnosis not present

## 2018-05-29 DIAGNOSIS — Z79890 Hormone replacement therapy (postmenopausal): Secondary | ICD-10-CM | POA: Insufficient documentation

## 2018-05-29 MED ORDER — LACTATED RINGERS IV SOLN
1000.0000 mL | Freq: Once | INTRAVENOUS | Status: AC
  Administered 2018-05-29: 1000 mL via INTRAVENOUS

## 2018-05-29 MED ORDER — MIDAZOLAM HCL 5 MG/5ML IJ SOLN
INTRAMUSCULAR | Status: AC
Start: 2018-05-29 — End: ?
  Filled 2018-05-29: qty 5

## 2018-05-29 MED ORDER — FENTANYL CITRATE (PF) 100 MCG/2ML IJ SOLN
INTRAMUSCULAR | Status: AC
  Filled 2018-05-29: qty 2

## 2018-05-29 MED ORDER — ROPIVACAINE HCL 2 MG/ML IJ SOLN
INTRAMUSCULAR | Status: AC
  Filled 2018-05-29: qty 10

## 2018-05-29 MED ORDER — MIDAZOLAM HCL 5 MG/5ML IJ SOLN
1.0000 mg | INTRAMUSCULAR | Status: DC | PRN
  Administered 2018-05-29: 3 mg via INTRAVENOUS

## 2018-05-29 MED ORDER — METHYLPREDNISOLONE ACETATE 80 MG/ML IJ SUSP
80.0000 mg | Freq: Once | INTRAMUSCULAR | Status: AC
  Administered 2018-05-29: 80 mg via INTRA_ARTICULAR

## 2018-05-29 MED ORDER — LIDOCAINE HCL 2 % IJ SOLN
20.0000 mL | Freq: Once | INTRAMUSCULAR | Status: AC
  Administered 2018-05-29: 400 mg

## 2018-05-29 MED ORDER — LIDOCAINE HCL 2 % IJ SOLN
INTRAMUSCULAR | Status: AC
  Filled 2018-05-29: qty 20

## 2018-05-29 MED ORDER — METHYLPREDNISOLONE ACETATE 80 MG/ML IJ SUSP
INTRAMUSCULAR | Status: AC
  Filled 2018-05-29: qty 1

## 2018-05-29 MED ORDER — FENTANYL CITRATE (PF) 100 MCG/2ML IJ SOLN
25.0000 ug | INTRAMUSCULAR | Status: DC | PRN
  Administered 2018-05-29: 100 ug via INTRAVENOUS

## 2018-05-29 MED ORDER — IOPAMIDOL (ISOVUE-M 200) INJECTION 41%
INTRAMUSCULAR | Status: AC
  Filled 2018-05-29: qty 10

## 2018-05-29 MED ORDER — ROPIVACAINE HCL 2 MG/ML IJ SOLN
9.0000 mL | Freq: Once | INTRAMUSCULAR | Status: AC
  Administered 2018-05-29: 10 mL via INTRA_ARTICULAR

## 2018-05-29 NOTE — Progress Notes (Signed)
Patient's Name: Monica Terry  MRN: [id number]  Referring Provider: Marinda Elk, MD  DOB: December 08, 1966  PCP: Marinda Elk, MD  DOS: 05/29/2018  Note by: Gaspar Cola, MD  Service setting: Ambulatory outpatient  Specialty: Interventional Pain Management  Patient type: Established  Location: [facility] (AMB) Pain Management Facility  Visit type: Interventional Procedure   Primary Reason for Visit: Interventional Pain Management Treatment. CC: Shoulder Pain (right)  Procedure:  Anesthesia, Analgesia, Anxiolysis:  Type: Diagnostic Glenohumeral Joint (shoulder) Injection #1  CPT: 20610      Primary Purpose: Diagnostic Region: Superior Shoulder Area Level:  Shoulder Target Area: Glenohumeral Joint (shoulder) Approach: Anterior approach. Laterality: Right Position: Supine  Type: Moderate (Conscious) Sedation combined with Local Anesthesia Indication(s): Analgesia and Anxiety Route: Intravenous (IV) IV Access: Secured Sedation: Meaningful verbal contact was maintained at all times during the procedure  Local Anesthetic: Lidocaine 1-2%   Indications: 1. Osteoarthritis of shoulder (Right)   2. Osteoarthritis of AC (acromioclavicular) joint (Right)   3. Chronic Subdeltoid bursitis (with calcifications) (Right)   4. Chronic shoulder pain (Secondary Area of Pain) (Right)    Pain Score: Pre-procedure: 8 /10 Post-procedure: 7 (when raising arm )/10  Pre-op Assessment:  Monica Terry is a 52 y.o. (year old), female patient, seen today for interventional treatment. 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 Monica Terry has a current medication list which includes the following prescription(s): albuterol, alendronate, artificial tears, aspirin, bupropion, cetirizine, docusate sodium, estradiol, fluticasone, fluticasone-salmeterol, gabapentin, levothyroxine, lidocaine, liraglutide, meloxicam, montelukast, NON FORMULARY, ondansetron, oxycodone, pregabalin, ranitidine, rizatriptan, simvastatin, tiotropium, tizanidine, and trazodone, and the following Facility-Administered Medications: fentanyl and midazolam. Her primarily concern today is the Shoulder Pain (right)  Initial Vital Signs:  Pulse/HCG Rate: 70ECG Heart Rate: 75 Temp: 97.8 F (36.6 C) Resp: 16 BP: 121/70 SpO2: 100 %  BMI: Estimated body mass index is 33.45 kg/m as calculated from the following:   Height as of this encounter: 5\' 8"  (1.727 m).   Weight as of this encounter: 220 lb (99.8 kg).  Risk Assessment: Allergies: Reviewed. She has No Known Allergies.  Allergy Precautions: None required Coagulopathies: Reviewed. None identified.  Blood-thinner therapy: None at this time Active Infection(s): Reviewed. None identified. Monica Terry is afebrile  Site Confirmation: Monica Terry was asked to confirm the procedure and laterality before marking the site Procedure checklist: Completed Consent: Before the procedure and under the influence of no sedative(s), amnesic(s), or anxiolytics, the patient was informed of the treatment options, risks and possible complications. To fulfill our ethical and legal obligations, as recommended by the American Medical Association's Code of Ethics, I have informed the patient of my clinical impression; the nature and purpose of the treatment or procedure; the risks, benefits, and possible complications of the intervention; the alternatives, including doing nothing; the risk(s) and benefit(s) of the alternative treatment(s) or procedure(s); and the risk(s) and benefit(s) of doing nothing. The patient was  provided information about the general risks and possible complications associated with the procedure. These may include, but are not limited to: failure to achieve desired goals, infection, bleeding, organ or nerve damage, allergic reactions, paralysis, and death. In addition, the patient was informed of those risks and complications associated to the procedure, such as failure to decrease pain;  infection; bleeding; organ or nerve damage with subsequent damage to sensory, motor, and/or autonomic systems, resulting in permanent pain, numbness, and/or weakness of one or several areas of the body; allergic reactions; (i.e.: anaphylactic reaction); and/or death. Furthermore, the patient was informed of those risks and complications associated with the medications. These include, but are not limited to: allergic reactions (i.e.: anaphylactic or anaphylactoid reaction(s)); adrenal axis suppression; blood sugar elevation that in diabetics may result in ketoacidosis or comma; water retention that in patients with history of congestive heart failure may result in shortness of breath, pulmonary edema, and decompensation with resultant heart failure; weight gain; swelling or edema; medication-induced neural toxicity; particulate matter embolism and blood vessel occlusion with resultant organ, and/or nervous system infarction; and/or aseptic necrosis of one or more joints. Finally, the patient was informed that Medicine is not an exact science; therefore, there is also the possibility of unforeseen or unpredictable risks and/or possible complications that may result in a catastrophic outcome. The patient indicated having understood very clearly. We have given the patient no guarantees and we have made no promises. Enough time was given to the patient to ask questions, all of which were answered to the patient's satisfaction. Monica Terry has indicated that she wanted to continue with the procedure. Attestation: I, the  ordering provider, attest that I have discussed with the patient the benefits, risks, side-effects, alternatives, likelihood of achieving goals, and potential problems during recovery for the procedure that I have provided informed consent. Date  Time: 05/29/2018  8:15 AM  Pre-Procedure Preparation:  Monitoring: As per clinic protocol. Respiration, ETCO2, SpO2, BP, heart rate and rhythm monitor placed and checked for adequate function Safety Precautions: Patient was assessed for positional comfort and pressure points before starting the procedure. Time-out: I initiated and conducted the "Time-out" before starting the procedure, as per protocol. The patient was asked to participate by confirming the accuracy of the "Time Out" information. Verification of the correct person, site, and procedure were performed and confirmed by me, the nursing staff, and the patient. "Time-out" conducted as per Joint Commission's Universal Protocol (UP.01.01.01). Time: 0904  Description of Procedure Process:   Area Prepped: Entire shoulder Area Prepping solution: ChloraPrep (2% chlorhexidine gluconate and 70% isopropyl alcohol) Safety Precautions: Aspiration looking for blood return was conducted prior to all injections. At no point did we inject any substances, as a needle was being advanced. No attempts were made at seeking any paresthesias. Safe injection practices and needle disposal techniques used. Medications properly checked for expiration dates. SDV (single dose vial) medications used. Description of the Procedure: Protocol guidelines were followed. The patient was placed in position over the procedure table. The target area was identified and the area prepped in the usual manner. Skin & deeper tissues infiltrated with local anesthetic. Appropriate amount of time allowed to pass for local anesthetics to take effect. The procedure needles were then advanced to the target area. Proper needle placement secured.  Negative aspiration confirmed. Solution injected in intermittent fashion, asking for systemic symptoms every 0.5cc of injectate. The needles were then removed and the area cleansed, making sure to leave some of the prepping solution back to take advantage of its long term bactericidal properties.  Vitals:   05/29/18 0917 05/29/18 0922 05/29/18 0932 05/29/18 0942  BP: 121/88 127/73 129/78 115/65  Pulse:  75 74 75  Resp: 18 14 20  (!) 22  Temp:  97.7 F (36.5 C)  97.7 F (36.5 C)  TempSrc:  Temporal  Temporal  SpO2: 98% 97% 98% 99%  Weight:      Height:        Start Time: 0904 hrs. End Time: 0915 hrs. Materials:  Needle(s) Type: Regular needle Gauge: 22G Length: 3.5-in Medication(s): Please see orders for medications and dosing details.  Imaging Guidance (Non-Spinal):  Type of Imaging Technique: Fluoroscopy Guidance (Non-Spinal) Indication(s): Assistance in needle guidance and placement for procedures requiring needle placement in or near specific anatomical locations not easily accessible without such assistance. Exposure Time: Please see nurses notes. Contrast: None used. Fluoroscopic Guidance: I was personally present during the use of fluoroscopy. "Tunnel Vision Technique" used to obtain the best possible view of the target area. Parallax error corrected before commencing the procedure. "Direction-depth-direction" technique used to introduce the needle under continuous pulsed fluoroscopy. Once target was reached, antero-posterior, oblique, and lateral fluoroscopic projection used confirm needle placement in all planes. Images permanently stored in EMR. Interpretation: No contrast injected. I personally interpreted the imaging intraoperatively. Adequate needle placement confirmed in multiple planes. Permanent images saved into the patient's record.  Antibiotic Prophylaxis:   Anti-infectives (From admission, onward)   None     Indication(s): None identified  Post-operative  Assessment:  Post-procedure Vital Signs:  Pulse/HCG Rate: 7584 Temp: 97.7 F (36.5 C) Resp: (!) 22 BP: 115/65 SpO2: 99 %  EBL: None  Complications: No immediate post-treatment complications observed by team, or reported by patient.  Note: The patient tolerated the entire procedure well. A repeat set of vitals were taken after the procedure and the patient was kept under observation following institutional policy, for this type of procedure. Post-procedural neurological assessment was performed, showing return to baseline, prior to discharge. The patient was provided with post-procedure discharge instructions, including a section on how to identify potential problems. Should any problems arise concerning this procedure, the patient was given instructions to immediately contact us, at any time, without hesitation. In any case, we plan to contact the patient by telephone for a follow-up status report regarding this interventional procedure.  Comments:  No additional relevant information.  Plan of Care    Imaging Orders     DG C-Arm 1-60 Min-No Report  Procedure Orders     SHOULDER INJECTION  Medications ordered for procedure: Meds ordered this encounter  Medications  . lidocaine (XYLOCAINE) 2 % (with pres) injection 400 mg  . midazolam (VERSED) 5 MG/5ML injection 1-2 mg    Make sure Flumazenil is available in the pyxis when using this medication. If oversedation occurs, administer 0.2 mg IV over 15 sec. If after 45 sec no response, administer 0.2 mg again over 1 min; may repeat at 1 min intervals; not to exceed 4 doses (1 mg)  . fentaNYL (SUBLIMAZE) injection 25-50 mcg    Make sure Narcan is available in the pyxis when using this medication. In the event of respiratory depression (RR< 8/min): Titrate NARCAN (naloxone) in increments of 0.1 to 0.2 mg IV at 2-3 minute intervals, until desired degree of reversal.  . lactated ringers infusion 1,000 mL  . methylPREDNISolone acetate  (DEPO-MEDROL) injection 80 mg  . ropivacaine (PF) 2 mg/mL (0.2%) (NAROPIN) injection 9 mL   Medications administered: We administered lidocaine, midazolam, fentaNYL, lactated ringers, methylPREDNISolone acetate, and ropivacaine (PF) 2 mg/mL (0.2%).  See the medical record for exact dosing, route, and time of administration.  New Prescriptions   No medications on file   Disposition: Discharge home  Discharge Date & Time: 05/29/2018; 0947 hrs.   Physician-requested Follow-up: Return for post-procedure eval (  2 wks), w/ Dr. Dossie Arbour.  Future Appointments  Date Time Provider [place]  06/11/2018  1:30 PM Milinda Pointer, MD [facility] None   Primary Care Physician: Marinda Elk, MD Location: [facility] Note by: Gaspar Cola, MD Date: 05/29/2018; Time: 10:53 AM  Disclaimer:  Medicine is not an exact science. The only guarantee in medicine is that nothing is guaranteed. It is important to note that the decision to proceed with this intervention was based on the information collected from the patient. The Data and conclusions were drawn from the patient's questionnaire, the interview, and the physical examination. Because the information was provided in large part by the patient, it cannot be guaranteed that it has not been purposely or unconsciously manipulated. Every effort has been made to obtain as much relevant data as possible for this evaluation. It is important to note that the conclusions that lead to this procedure are derived in large part from the available data. Always take into account that the treatment will also be dependent on availability of resources and existing treatment guidelines, considered by other Pain Management Practitioners as being common knowledge and practice, at the time of the intervention. For Medico-Legal purposes, it is also important to point out that variation in procedural techniques and pharmacological choices  are the acceptable norm. The indications, contraindications, technique, and results of the above procedure should only be interpreted and judged by a Board-Certified Interventional Pain Specialist with extensive familiarity and expertise in the same exact procedure and technique.

## 2018-05-29 NOTE — Patient Instructions (Signed)

## 2018-05-29 NOTE — Progress Notes (Signed)
Safety precautions to be maintained throughout the outpatient stay will include: orient to surroundings, keep bed in low position, maintain call bell within reach at all times, provide assistance with transfer out of bed and ambulation.  

## 2018-05-30 ENCOUNTER — Telehealth: Payer: Self-pay | Admitting: *Deleted

## 2018-05-30 NOTE — Telephone Encounter (Signed)
Attempted to call for post procedure follow-up. Message left. 

## 2018-06-11 ENCOUNTER — Ambulatory Visit: Payer: Medicaid Other | Attending: Pain Medicine | Admitting: Pain Medicine

## 2018-06-11 ENCOUNTER — Other Ambulatory Visit: Payer: Self-pay

## 2018-06-11 ENCOUNTER — Encounter: Payer: Self-pay | Admitting: Pain Medicine

## 2018-06-11 VITALS — BP 111/70 | HR 87 | Temp 98.7°F | Ht 68.0 in | Wt 235.0 lb

## 2018-06-11 DIAGNOSIS — M533 Sacrococcygeal disorders, not elsewhere classified: Secondary | ICD-10-CM | POA: Diagnosis not present

## 2018-06-11 DIAGNOSIS — E669 Obesity, unspecified: Secondary | ICD-10-CM | POA: Insufficient documentation

## 2018-06-11 DIAGNOSIS — J449 Chronic obstructive pulmonary disease, unspecified: Secondary | ICD-10-CM | POA: Diagnosis not present

## 2018-06-11 DIAGNOSIS — Z794 Long term (current) use of insulin: Secondary | ICD-10-CM | POA: Diagnosis not present

## 2018-06-11 DIAGNOSIS — E039 Hypothyroidism, unspecified: Secondary | ICD-10-CM | POA: Insufficient documentation

## 2018-06-11 DIAGNOSIS — F329 Major depressive disorder, single episode, unspecified: Secondary | ICD-10-CM | POA: Insufficient documentation

## 2018-06-11 DIAGNOSIS — M25562 Pain in left knee: Secondary | ICD-10-CM | POA: Diagnosis not present

## 2018-06-11 DIAGNOSIS — M961 Postlaminectomy syndrome, not elsewhere classified: Secondary | ICD-10-CM

## 2018-06-11 DIAGNOSIS — Z79891 Long term (current) use of opiate analgesic: Secondary | ICD-10-CM | POA: Diagnosis not present

## 2018-06-11 DIAGNOSIS — M19011 Primary osteoarthritis, right shoulder: Secondary | ICD-10-CM | POA: Diagnosis not present

## 2018-06-11 DIAGNOSIS — G8929 Other chronic pain: Secondary | ICD-10-CM

## 2018-06-11 DIAGNOSIS — Z6835 Body mass index (BMI) 35.0-35.9, adult: Secondary | ICD-10-CM | POA: Diagnosis not present

## 2018-06-11 DIAGNOSIS — G5603 Carpal tunnel syndrome, bilateral upper limbs: Secondary | ICD-10-CM | POA: Diagnosis not present

## 2018-06-11 DIAGNOSIS — M25561 Pain in right knee: Secondary | ICD-10-CM | POA: Diagnosis not present

## 2018-06-11 DIAGNOSIS — M25511 Pain in right shoulder: Secondary | ICD-10-CM | POA: Insufficient documentation

## 2018-06-11 DIAGNOSIS — E119 Type 2 diabetes mellitus without complications: Secondary | ICD-10-CM | POA: Diagnosis not present

## 2018-06-11 DIAGNOSIS — M542 Cervicalgia: Secondary | ICD-10-CM

## 2018-06-11 DIAGNOSIS — K219 Gastro-esophageal reflux disease without esophagitis: Secondary | ICD-10-CM | POA: Diagnosis not present

## 2018-06-11 DIAGNOSIS — Z87891 Personal history of nicotine dependence: Secondary | ICD-10-CM | POA: Insufficient documentation

## 2018-06-11 DIAGNOSIS — Z7982 Long term (current) use of aspirin: Secondary | ICD-10-CM | POA: Insufficient documentation

## 2018-06-11 DIAGNOSIS — M5412 Radiculopathy, cervical region: Secondary | ICD-10-CM

## 2018-06-11 DIAGNOSIS — G894 Chronic pain syndrome: Secondary | ICD-10-CM | POA: Insufficient documentation

## 2018-06-11 DIAGNOSIS — M47812 Spondylosis without myelopathy or radiculopathy, cervical region: Secondary | ICD-10-CM | POA: Insufficient documentation

## 2018-06-11 DIAGNOSIS — Z79899 Other long term (current) drug therapy: Secondary | ICD-10-CM | POA: Diagnosis not present

## 2018-06-11 MED ORDER — OXYCODONE HCL 5 MG PO TABS: 5.0000 mg | ORAL_TABLET | Freq: Every day | ORAL | 0 refills | Status: DC | PRN

## 2018-06-11 NOTE — Progress Notes (Signed)
Nursing Pain Medication Assessment:  Safety precautions to be maintained throughout the outpatient stay will include: orient to surroundings, keep bed in low position, maintain call bell within reach at all times, provide assistance with transfer out of bed and ambulation.  Medication Inspection Compliance: Pill count conducted under aseptic conditions, in front of the patient. Neither the pills nor the bottle was removed from the patient's sight at any time. Once count was completed pills were immediately returned to the patient in their original bottle.  Medication: Oxycodone IR Pill/Patch Count: 4 of 30 pills remain Pill/Patch Appearance: Markings consistent with prescribed medication Bottle Appearance: Standard pharmacy container. Clearly labeled. Filled Date: 05/22 / 2019 Last Medication intake:  Today

## 2018-06-11 NOTE — Patient Instructions (Addendum)
____________________________________________________________________________________________  Preparing for Procedure with Sedation  Instructions: . Oral Intake: Do not eat or drink anything for at least 8 hours prior to your procedure. . Transportation: Public transportation is not allowed. Bring an adult driver. The driver must be physically present in our waiting room before any procedure can be started. Marland Kitchen Physical Assistance: Bring an adult physically capable of assisting you, in the event you need help. This adult should keep you company at home for at least 6 hours after the procedure. . Blood Pressure Medicine: Take your blood pressure medicine with a sip of water the morning of the procedure. . Blood thinners:  . Diabetics on insulin: Notify the staff so that you can be scheduled 1st case in the morning. If your diabetes requires high dose insulin, take only  of your normal insulin dose the morning of the procedure and notify the staff that you have done so. . Preventing infections: Shower with an antibacterial soap the morning of your procedure. . Build-up your immune system: Take 1000 mg of Vitamin C with every meal (3 times a day) the day prior to your procedure. Marland Kitchen Antibiotics: Inform the staff if you have a condition or reason that requires you to take antibiotics before dental procedures. . Pregnancy: If you are pregnant, call and cancel the procedure. . Sickness: If you have a cold, fever, or any active infections, call and cancel the procedure. . Arrival: You must be in the facility at least 30 minutes prior to your scheduled procedure. . Children: Do not bring children with you. . Dress appropriately: Bring dark clothing that you would not mind if they get stained. . Valuables: Do not bring any jewelry or valuables.  Procedure appointments are reserved for interventional treatments only. Marland Kitchen No Prescription Refills. . No medication changes will be discussed during procedure  appointments. . No disability issues will be discussed.  Remember:  Regular Business hours are:  Monday to Thursday 8:00 AM to 4:00 PM  Provider's Schedule: Milinda Pointer, MD:  Procedure days: Tuesday and Thursday 7:30 AM to 4:00 PM  Gillis Santa, MD:  Procedure days: Monday and Wednesday 7:30 AM to 4:00 PM ____________________________________________________________________________________________    Trigger Point Injection Trigger points are areas where you have pain. A trigger point injection is a shot given in the trigger point to help relieve pain for a few days to a few months. Common places for trigger points include:  The neck.  The shoulders.  The upper back.  The lower back.  A trigger point injection will not cure long-lasting (chronic) pain permanently. These injections do not always work for every person, but for some people they can help to relieve pain for a few days to a few months. Tell a health care provider about:  Any allergies you have.  All medicines you are taking, including vitamins, herbs, eye drops, creams, and over-the-counter medicines.  Any problems you or family members have had with anesthetic medicines.  Any blood disorders you have.  Any surgeries you have had.  Any medical conditions you have. What are the risks? Generally, this is a safe procedure. However, problems may occur, including:  Infection.  Bleeding.  Allergic reaction to the injected medicine.  Irritation of the skin around the injection site.  What happens before the procedure?  Ask your health care provider about changing or stopping your regular medicines. This is especially important if you are taking diabetes medicines or blood thinners. What happens during the procedure?  Your health  care provider will feel for trigger points. A marker may be used to circle the area for the injection.  The skin over the trigger point will be washed with a germ-killing  (antiseptic) solution.  A thin needle is used for the shot. You may feel pain or a twitching feeling when the needle enters the trigger point.  A numbing solution may be injected into the trigger point. Sometimes a medicine to keep down swelling, redness, and warmth (inflammation) is also injected.  Your health care provider may move the needle around the area where the trigger point is located until the tightness and twitching goes away.  After the injection, your health care provider may put gentle pressure over the injection site.  The injection site will be covered with a bandage (dressing). The procedure may vary among health care providers and hospitals. What happens after the procedure?  The dressing can be taken off in a few hours or as told by your health care provider.  You may feel sore and stiff for 1-2 days. This information is not intended to replace advice given to you by your health care provider. Make sure you discuss any questions you have with your health care provider. Document Released: 12/01/2011 Document Revised: 08/14/2016 Document Reviewed: 06/01/2015 Elsevier Interactive Patient Education  2018 [place].  ____________________________________________________________________________________________  Muscle Spasms & Cramps  Cause:  The most common cause of muscle spasms and cramps is vitamin and/or electrolyte (calcium, potassium, sodium, etc.) deficiencies.  Possible triggers: Sweating - causes loss of electrolytes thru the skin. Steroids - causes loss of electrolytes thru the urine.  Treatment: 1. Gatorade (or any other electrolyte-replenishing drink) - Take 1, 8 oz glass with each meal (3 times a day). 2. OTC (over-the-counter) Magnesium 400 to 500 mg - Take 1 tablet twice a day (one with breakfast and one before bedtime). If you have kidney problems, talk to your primary care physician before taking any Magnesium. 3. Tonic Water with quinine - Take  1, 8 oz glass before bedtime.   ____________________________________________________________________________________________

## 2018-06-11 NOTE — Progress Notes (Signed)
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    Side: Right lower extremity  Side: Left lower extremity  Stability: No instability observed          Stability: No instability observed          Skin & Extremity Inspection: Skin color, temperature, and hair growth are WNL. No peripheral edema or cyanosis. No masses, redness, swelling, asymmetry, or associated skin lesions. No contractures.  Skin & Extremity Inspection: Skin color, temperature, and hair growth are WNL. No peripheral edema or cyanosis. No masses, redness, swelling, asymmetry, or associated skin lesions. No contractures.  Functional ROM: Unrestricted ROM                  Functional ROM: Unrestricted ROM                  Muscle Tone/Strength: Functionally intact. No obvious neuro-muscular anomalies detected.  Muscle Tone/Strength: Functionally intact. No obvious neuro-muscular anomalies detected.  Sensory (Neurological): Unimpaired  Sensory (Neurological): Unimpaired  Palpation: No palpable anomalies   Palpation: No palpable anomalies   Assessment  Primary Diagnosis & Pertinent Problem List: The primary encounter diagnosis was Acromioclavicular [facility]) joint (Right). Diagnoses of Chronic shoulder pain (Secondary Area of Pain) (Right), Chronic neck pain (Primary Area of Pain) (Bilateral) (R>L), Chronic knee pain (Tertiary Area of Pain) (Bilateral) (R>L), Cervical post-laminectomy syndrome, Cervical radiculopathy, Cervical facet syndrome, and Chronic pain syndrome were also pertinent to this visit.  Status Diagnosis  Persistent Improved Improved 1. Acromioclavicular (AC) joint (Right)   2. Chronic shoulder pain (Secondary Area of Pain) (Right)   3. Chronic neck pain (Primary Area of Pain) (Bilateral) (R>L)   4. Chronic knee pain (Tertiary Area of Pain) (Bilateral) (R>L)   5. Cervical post-laminectomy syndrome   6. Cervical radiculopathy   7. Cervical facet syndrome   8. Chronic  pain syndrome     Problems updated and reviewed during this visit: Problem  Cervical Facet Syndrome  Acromioclavicular (AC) joint (Right)   Plan of Care  Pharmacotherapy (Medications Ordered): Meds ordered this encounter  Medications  . oxyCODONE (OXY IR/ROXICODONE) 5 MG immediate release tablet    Sig: Take 1 tablet (5 mg total) by mouth daily as needed for severe pain.    Dispense:  30 tablet    Refill:  0    Do not place this medication, or any other prescription from our practice, on "Automatic Refill". Patient may have prescription filled one day early if pharmacy is closed on scheduled refill date. Do not fill until: 06/15/18 To last until: 07/15/18   Medications administered today: Monica Terry had no medications administered during this visit.   Procedure Orders     SHOULDER INJECTION Lab Orders  No laboratory test(s) ordered today   Imaging Orders  No imaging studies ordered today   Referral Orders  No referral(s) requested today   Interventional management options: Planned,  scheduled, and/or pending:    Diagnostic right AC joint injection #1 under fluoro and IV sedation   Considering:   Diagnosticbilateral cervicalfacet nerve block #1  Possiblebilateral cervical facet RFA  Diagnostic right shoulder intra-articular joint injection  Diagnostic right suprascapular nerve block  Possibleright suprascapular RFA  Diagnostic bilateral intra-articular knee injection  Possible series of 5 intra-articular bilateral [place] knee injection series Diagnosticbilateral genicular nerve block  Possible bilateral genicular RFA    PRN Procedures:   None at this time   Provider-requested follow-up: Return for Procedure (w/ sedation): (R) AC Joint inj #1.  Future Appointments  Date Time Provider [place]  07/10/2018 10:30 AM Vevelyn Francois, NP [facility] None  07/17/2018  8:00 AM Milinda Pointer, MD [facility] None   Primary Care Physician: Marinda Elk, MD Location: [facility] Note by: Gaspar Cola, MD Date: 06/11/2018; Time: 3:59 PM

## 2018-07-10 ENCOUNTER — Encounter: Payer: Medicaid Other | Admitting: Nurse Practitioner

## 2018-07-10 NOTE — Progress Notes (Deleted)
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 Patient Instructions on file for this visit.

## 2018-07-17 ENCOUNTER — Ambulatory Visit: Payer: Medicaid Other | Attending: Pain Medicine | Admitting: Pain Medicine

## 2018-07-17 NOTE — Progress Notes (Deleted)
Patient's Name: Monica Terry  MRN: [id number]  Referring Provider: Marinda Elk, MD  DOB: [birth year]  PCP: Marinda Elk, MD  DOS: 07/17/2018  Note by: Gaspar Cola, MD  Service setting: Ambulatory outpatient  Specialty: Interventional Pain Management  Patient type: Established  Location: [facility] (AMB) Pain Management Facility  Visit type: Interventional Procedure   Primary Reason for Visit: Interventional Pain Management Treatment. CC: No chief complaint on file.  Procedure:          Anesthesia, Analgesia, Anxiolysis:  Type: Diagnostic Acromio-clavicular Joint Injection #1  CPT: 20610      Primary Purpose: Diagnostic Region: Superior Shoulder Area Level:  Shoulder Target Area: Acromio-clavicular Joint Approach: Anterolateral approach. Laterality: Right-Sided Position: Supine  Type: Moderate (Conscious) Sedation combined with Local Anesthesia Indication(s): Analgesia and Anxiety Route: Intravenous (IV) IV Access: Secured Sedation: Meaningful verbal contact was maintained at all times during the procedure  Local Anesthetic: Lidocaine 1-2%   Indications: 1. Osteoarthritis of AC (acromioclavicular) joint (Right)   2. Chronic shoulder pain (Secondary Area of Pain) (Right)   3. Chronic Subdeltoid bursitis (with calcifications) (Right)   4. Osteoarthritis of shoulder (Right)    Pain Score: Pre-procedure:  /10 Post-procedure:  /10  Pre-op Assessment:  Monica Terry is a 52 y.o. (year old), female patient, seen today for interventional treatment. 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  Monica Terry has a current medication list which includes the following prescription(s): albuterol, alendronate, artificial tears, aspirin, bupropion, cetirizine, docusate sodium, estradiol, fluticasone, fluticasone-salmeterol, gabapentin, levothyroxine, lidocaine, liraglutide, meloxicam, montelukast, NON FORMULARY, nortriptyline, ondansetron, oxycodone, pregabalin, ranitidine, rizatriptan, simvastatin, tiotropium, tizanidine, trazodone, and varenicline. Her primarily concern today is the No chief complaint on file.  Initial Vital Signs:  Pulse/HCG Rate:    Temp:   Resp:   BP:   SpO2:    BMI: Estimated body mass index is 35.73 kg/m as calculated from the following:   Height as of 06/11/18: 5\' 8"  (1.727 m).   Weight as of 06/11/18: 235 lb (106.6 kg).  Risk Assessment: Allergies: Reviewed. She has No Known Allergies.  Allergy Precautions: None required Coagulopathies: Reviewed. None identified.  Blood-thinner therapy: None at this time Active Infection(s): Reviewed. None identified. Monica Terry is afebrile  Site Confirmation: Monica Terry was asked to confirm the procedure and laterality before marking the site Procedure checklist: Completed Consent: Before the procedure and under the influence of no sedative(s), amnesic(s), or anxiolytics, the patient was informed of the treatment options, risks and possible complications. To fulfill our ethical and legal obligations, as recommended by the American Medical Association's Code of Ethics, I have informed the patient of my clinical impression; the nature and purpose of the treatment or procedure; the risks, benefits, and possible complications of the intervention; the alternatives, including doing nothing; the risk(s) and benefit(s) of the alternative treatment(s) or procedure(s); and the risk(s) and benefit(s) of doing nothing. The patient was provided information about the general risks and possible complications associated with the procedure. These  may include, but are not limited to: failure to achieve desired goals, infection, bleeding, organ or nerve damage, allergic reactions, paralysis, and death. In addition, the patient was informed of those risks and complications associated to the procedure, such as failure to decrease pain; infection; bleeding;  organ or nerve damage with subsequent damage to sensory, motor, and/or autonomic systems, resulting in permanent pain, numbness, and/or weakness of one or several areas of the body; allergic reactions; (i.e.: anaphylactic reaction); and/or death. Furthermore, the patient was informed of those risks and complications associated with the medications. These include, but are not limited to: allergic reactions (i.e.: anaphylactic or anaphylactoid reaction(s)); adrenal axis suppression; blood sugar elevation that in diabetics may result in ketoacidosis or comma; water retention that in patients with history of congestive heart failure may result in shortness of breath, pulmonary edema, and decompensation with resultant heart failure; weight gain; swelling or edema; medication-induced neural toxicity; particulate matter embolism and blood vessel occlusion with resultant organ, and/or nervous system infarction; and/or aseptic necrosis of one or more joints. Finally, the patient was informed that Medicine is not an exact science; therefore, there is also the possibility of unforeseen or unpredictable risks and/or possible complications that may result in a catastrophic outcome. The patient indicated having understood very clearly. We have given the patient no guarantees and we have made no promises. Enough time was given to the patient to ask questions, all of which were answered to the patient's satisfaction. Monica Terry has indicated that she wanted to continue with the procedure. Attestation: I, the ordering provider, attest that I have discussed with the patient the benefits, risks, side-effects, alternatives,  likelihood of achieving goals, and potential problems during recovery for the procedure that I have provided informed consent. Date  Time: {CHL [facility]-PAIN TIME CHOICES:[id number]}  Pre-Procedure Preparation:  Monitoring: As per clinic protocol. Respiration, ETCO2, SpO2, BP, heart rate and rhythm monitor placed and checked for adequate function Safety Precautions: Patient was assessed for positional comfort and pressure points before starting the procedure. Time-out: I initiated and conducted the "Time-out" before starting the procedure, as per protocol. The patient was asked to participate by confirming the accuracy of the "Time Out" information. Verification of the correct person, site, and procedure were performed and confirmed by me, the nursing staff, and the patient. "Time-out" conducted as per Joint Commission's Universal Protocol (UP.01.01.01). Time:    Description of Procedure:          Area Prepped: Entire shoulder Area Prepping solution: ChloraPrep (2% chlorhexidine gluconate and 70% isopropyl alcohol) Safety Precautions: Aspiration looking for blood return was conducted prior to all injections. At no point did we inject any substances, as a needle was being advanced. No attempts were made at seeking any paresthesias. Safe injection practices and needle disposal techniques used. Medications properly checked for expiration dates. SDV (single dose vial) medications used. Description of the Procedure: Protocol guidelines were followed. The patient was placed in position over the procedure table. The target area was identified and the area prepped in the usual manner. Skin & deeper tissues infiltrated with local anesthetic. Appropriate amount of time allowed to pass for local anesthetics to take effect. The procedure needles were then advanced to the target area. Proper needle placement secured. Negative aspiration confirmed. Solution injected in intermittent fashion, asking for systemic symptoms  every 0.5cc of injectate. The needles were then removed and the area cleansed, making sure to leave some of the prepping solution back to take advantage of its long term bactericidal properties.    There were no vitals filed for this visit.  Start Time:   hrs. End Time:   hrs. Materials:  Needle(s) Type: Regular needle Gauge: 22G Length: 3.5-in Medication(s): Please see orders for medications and dosing details.  Imaging Guidance (Non-Spinal):  Type of Imaging Technique: Fluoroscopy Guidance (Non-Spinal) Indication(s): Assistance in needle guidance and placement for procedures requiring needle placement in or near specific anatomical locations not easily accessible without such assistance. Exposure Time: Please see nurses notes. Contrast: None used. Fluoroscopic Guidance: I was personally present during the use of fluoroscopy. "Tunnel Vision Technique" used to obtain the best possible view of the target area. Parallax error corrected before commencing the procedure. "Direction-depth-direction" technique used to introduce the needle under continuous pulsed fluoroscopy. Once target was reached, antero-posterior, oblique, and lateral fluoroscopic projection used confirm needle placement in all planes. Images permanently stored in EMR. Interpretation: No contrast injected. I personally interpreted the imaging intraoperatively. Adequate needle placement confirmed in multiple planes. Permanent images saved into the patient's record.  Antibiotic Prophylaxis:   Anti-infectives (From admission, onward)   None     Indication(s): None identified  Post-operative Assessment:  Post-procedure Vital Signs:  Pulse/HCG Rate:    Temp:   Resp:   BP:   SpO2:    EBL: None  Complications: No immediate post-treatment complications observed by team, or reported by patient.  Note: The patient tolerated the entire procedure well. A repeat set of vitals were taken after the procedure and the  patient was kept under observation following institutional policy, for this type of procedure. Post-procedural neurological assessment was performed, showing return to baseline, prior to discharge. The patient was provided with post-procedure discharge instructions, including a section on how to identify potential problems. Should any problems arise concerning this procedure, the patient was given instructions to immediately contact us, at any time, without hesitation. In any case, we plan to contact the patient by telephone for a follow-up status report regarding this interventional procedure.  Comments:  No additional relevant information.  Plan of Care   Imaging Orders  No imaging studies ordered today   Procedure Orders    No procedure(s) ordered today    Medications ordered for procedure: No orders of the defined types were placed in this encounter.  Medications administered: Monica Terry had no medications administered during this visit.  See the medical record for exact dosing, route, and time of administration.  New Prescriptions   No medications on file   Disposition: Discharge home  Discharge Date & Time: 07/17/2018;   hrs.   Physician-requested Follow-up: No follow-ups on file.  Future Appointments  Date Time Provider [place]  07/17/2018  8:00 AM Milinda Pointer, MD [facility] None   Primary Care Physician: Marinda Elk, MD Location: [facility] Note by: Gaspar Cola, MD Date: 07/17/2018; Time: 5:54 AM  Disclaimer:  Medicine is not an Chief Strategy Officer. The only guarantee in medicine is that nothing is guaranteed. It is important to note that the decision to proceed with this intervention was based on the information collected from the patient. The Data and conclusions were drawn from the patient's questionnaire, the interview, and the physical examination. Because the information was provided in large part by the  patient, it cannot be guaranteed that it has not been purposely or unconsciously manipulated. Every effort has been made to obtain as much relevant data as possible for this evaluation. It is important to note that the conclusions that lead to this procedure are derived in large part from the available data. Always take into account that the treatment will also be dependent on availability of resources and existing treatment guidelines, considered by other Pain Management Practitioners as being common knowledge and practice, at the time of  the intervention. For Medico-Legal purposes, it is also important to point out that variation in procedural techniques and pharmacological choices are the acceptable norm. The indications, contraindications, technique, and results of the above procedure should only be interpreted and judged by a Board-Certified Interventional Pain Specialist with extensive familiarity and expertise in the same exact procedure and technique.

## 2018-08-05 ENCOUNTER — Emergency Department
Admission: EM | Admit: 2018-08-05 | Discharge: 2018-08-05 | Disposition: A | Discharge status: Home / Self Care | Payer: Medicaid Other | Attending: Emergency Medicine | Admitting: Emergency Medicine

## 2018-08-05 ENCOUNTER — Other Ambulatory Visit: Payer: Self-pay

## 2018-08-05 ENCOUNTER — Encounter: Payer: Self-pay | Admitting: Emergency Medicine

## 2018-08-05 DIAGNOSIS — W260XXA Contact with knife, initial encounter: Secondary | ICD-10-CM | POA: Diagnosis not present

## 2018-08-05 DIAGNOSIS — Z79899 Other long term (current) drug therapy: Secondary | ICD-10-CM | POA: Diagnosis not present

## 2018-08-05 DIAGNOSIS — S61213A Laceration without foreign body of left middle finger without damage to nail, initial encounter: Secondary | ICD-10-CM | POA: Diagnosis not present

## 2018-08-05 DIAGNOSIS — F319 Bipolar disorder, unspecified: Secondary | ICD-10-CM | POA: Diagnosis not present

## 2018-08-05 DIAGNOSIS — S61211A Laceration without foreign body of left index finger without damage to nail, initial encounter: Secondary | ICD-10-CM | POA: Insufficient documentation

## 2018-08-05 DIAGNOSIS — G510 Bell's palsy: Secondary | ICD-10-CM | POA: Insufficient documentation

## 2018-08-05 DIAGNOSIS — Y92000 Kitchen of unspecified non-institutional (private) residence as  the place of occurrence of the external cause: Secondary | ICD-10-CM | POA: Insufficient documentation

## 2018-08-05 DIAGNOSIS — Y93G1 Activity, food preparation and clean up: Secondary | ICD-10-CM | POA: Insufficient documentation

## 2018-08-05 DIAGNOSIS — F419 Anxiety disorder, unspecified: Secondary | ICD-10-CM | POA: Diagnosis not present

## 2018-08-05 DIAGNOSIS — E039 Hypothyroidism, unspecified: Secondary | ICD-10-CM | POA: Diagnosis not present

## 2018-08-05 DIAGNOSIS — Z7984 Long term (current) use of oral hypoglycemic drugs: Secondary | ICD-10-CM | POA: Insufficient documentation

## 2018-08-05 DIAGNOSIS — Z23 Encounter for immunization: Secondary | ICD-10-CM | POA: Diagnosis not present

## 2018-08-05 DIAGNOSIS — Z7982 Long term (current) use of aspirin: Secondary | ICD-10-CM | POA: Diagnosis not present

## 2018-08-05 DIAGNOSIS — Y998 Other external cause status: Secondary | ICD-10-CM | POA: Insufficient documentation

## 2018-08-05 DIAGNOSIS — E119 Type 2 diabetes mellitus without complications: Secondary | ICD-10-CM | POA: Insufficient documentation

## 2018-08-05 DIAGNOSIS — S61215A Laceration without foreign body of left ring finger without damage to nail, initial encounter: Secondary | ICD-10-CM

## 2018-08-05 MED ORDER — CEPHALEXIN 500 MG PO CAPS: 500.0000 mg | ORAL_CAPSULE | Freq: Four times a day (QID) | ORAL | 0 refills | Status: DC

## 2018-08-05 MED ORDER — TETANUS-DIPHTH-ACELL PERTUSSIS 5-2.5-18.5 LF-MCG/0.5 IM SUSP
0.5000 mL | Freq: Once | INTRAMUSCULAR | Status: AC
  Administered 2018-08-05: 0.5 mL via INTRAMUSCULAR
  Filled 2018-08-05: qty 0.5

## 2018-08-05 NOTE — ED Notes (Signed)
See triage note   Presents with laceration to left hand   Laceration noted to middle and ring fingers

## 2018-08-05 NOTE — Discharge Instructions (Addendum)
Keep the areas dry as possible for the next 3 to 4 days.  The Steri-Strips will start to peel off by themselves.  Return if there is any sign of infection.  Been given a prescription for Keflex to prevent infection due to your diabetes.

## 2018-08-05 NOTE — ED Triage Notes (Addendum)
Pt arrived via POV with reports of laceration to 2 fingers on the left hand (middle and ring finger). Pt states she cut herself a couple of hours ago at home while cutting vegetables.  Pt states fingers continue to ooze blood. Pt has glove in place at this time.  Pt states she is DUE for tetanus shot
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
## 2018-08-16 ENCOUNTER — Other Ambulatory Visit: Payer: Self-pay | Admitting: Orthopedic Surgery

## 2018-08-16 DIAGNOSIS — M25511 Pain in right shoulder: Secondary | ICD-10-CM

## 2018-09-02 ENCOUNTER — Ambulatory Visit
Admission: RE | Admit: 2018-09-02 | Discharge: 2018-09-02 | Disposition: A | Discharge status: Home / Self Care | Payer: Medicaid Other | Source: Ambulatory Visit | Attending: Orthopedic Surgery | Admitting: Orthopedic Surgery

## 2018-09-02 DIAGNOSIS — M19011 Primary osteoarthritis, right shoulder: Secondary | ICD-10-CM | POA: Diagnosis not present

## 2018-09-02 DIAGNOSIS — M25511 Pain in right shoulder: Secondary | ICD-10-CM | POA: Insufficient documentation

## 2018-09-13 ENCOUNTER — Other Ambulatory Visit: Payer: Self-pay

## 2018-09-13 ENCOUNTER — Emergency Department
Admission: EM | Admit: 2018-09-13 | Discharge: 2018-09-13 | Disposition: A | Discharge status: Home / Self Care | Payer: Medicaid Other | Attending: Emergency Medicine | Admitting: Emergency Medicine

## 2018-09-13 ENCOUNTER — Encounter: Payer: Self-pay | Admitting: Emergency Medicine

## 2018-09-13 DIAGNOSIS — Z7982 Long term (current) use of aspirin: Secondary | ICD-10-CM | POA: Diagnosis not present

## 2018-09-13 DIAGNOSIS — Z7984 Long term (current) use of oral hypoglycemic drugs: Secondary | ICD-10-CM | POA: Insufficient documentation

## 2018-09-13 DIAGNOSIS — Z79899 Other long term (current) drug therapy: Secondary | ICD-10-CM | POA: Diagnosis not present

## 2018-09-13 DIAGNOSIS — M436 Torticollis: Secondary | ICD-10-CM | POA: Diagnosis not present

## 2018-09-13 DIAGNOSIS — Z87891 Personal history of nicotine dependence: Secondary | ICD-10-CM | POA: Diagnosis not present

## 2018-09-13 DIAGNOSIS — R51 Headache: Secondary | ICD-10-CM | POA: Insufficient documentation

## 2018-09-13 DIAGNOSIS — E039 Hypothyroidism, unspecified: Secondary | ICD-10-CM | POA: Diagnosis not present

## 2018-09-13 DIAGNOSIS — E119 Type 2 diabetes mellitus without complications: Secondary | ICD-10-CM | POA: Diagnosis not present

## 2018-09-13 DIAGNOSIS — M542 Cervicalgia: Secondary | ICD-10-CM | POA: Diagnosis present

## 2018-09-13 DIAGNOSIS — J449 Chronic obstructive pulmonary disease, unspecified: Secondary | ICD-10-CM | POA: Diagnosis not present
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
## 2018-09-13 NOTE — ED Notes (Signed)
Cervical collar applied by this RN.

## 2018-09-13 NOTE — ED Triage Notes (Signed)
Pt to ED via POV, pt c/o pain in her neck that radiates into her head. Pt states that she is sensitive to light as well. Pain is worse when she turned her head to the left side. Pt denies any known fever. Pt states that she is concerned because she has had viral and bacterial meningitis in the past. Pt is in NAD at this time. Pt states that she has had symptoms x 1 week.

## 2018-09-13 NOTE — ED Notes (Signed)
Goodman MD at bedside. 

## 2018-09-13 NOTE — Discharge Instructions (Addendum)
Please seek medical attention for any high fevers, chest pain, shortness of breath, change in behavior, persistent vomiting, bloody stool or any other new or concerning symptoms.  

## 2018-12-17 IMAGING — MR MR SHOULDER*R* W/O CM
7 series · 40 of 40 positions shown · non-contrast
Comparison: Right shoulder x-rays dated March 26, 2018.

CLINICAL DATA: Severe right shoulder pain.

EXAM:
MRI OF THE RIGHT SHOULDER WITHOUT CONTRAST
TECHNIQUE: Multiplanar, multisequence MR imaging of the shoulder was performed.
No intravenous contrast was administered.

[Series 5: PD fat-sat · axial · right · 4.0mm · 0.49mm/px · z∈[-92,+23]mm · 6 of 25 slices shown (1 of 2)]
[im 1/25]
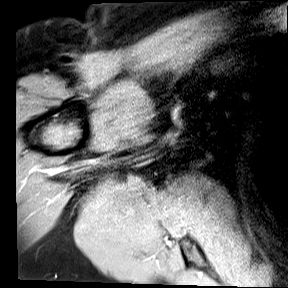
[im 5/25]
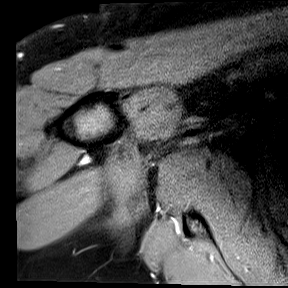
[im 10/25]
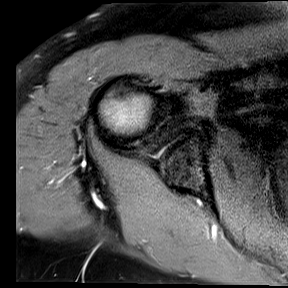
[im 15/25]
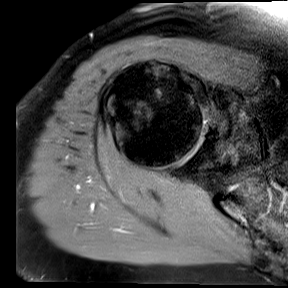
[im 20/25]
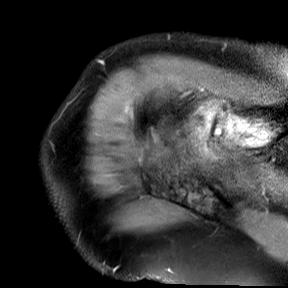
[im 25/25]
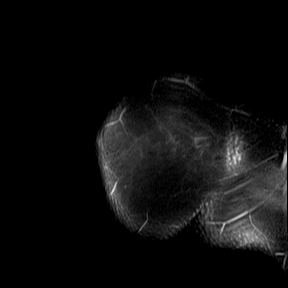

[Series 6: PD fat-sat · oblique · right · 4.0mm · 0.47mm/px · 6 of 26 slices shown (2 of 2)]
[im 1/26]
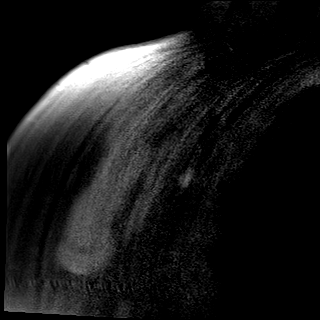
[im 6/26]
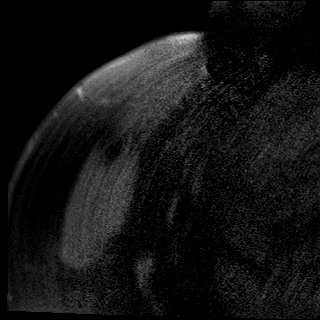
[im 11/26]
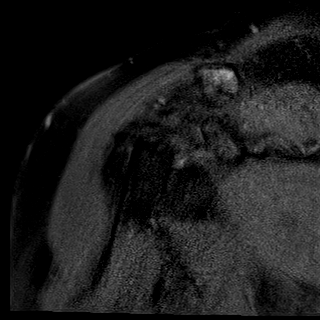
[im 16/26]
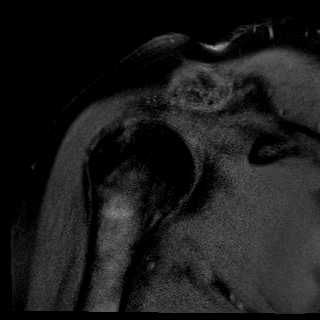
[im 21/26]
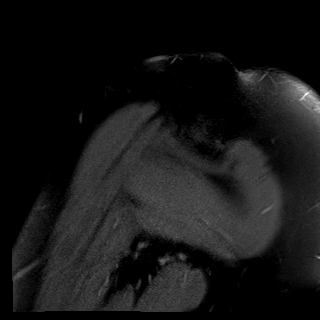
[im 26/26]
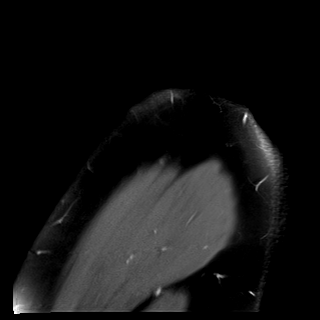

[Series 7: T2 fat-sat · oblique · right · 4.0mm · 0.44mm/px · 6 of 26 slices shown]
[im 1/26]
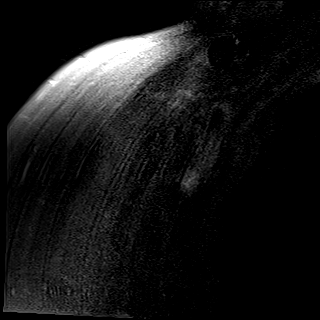
[im 6/26]
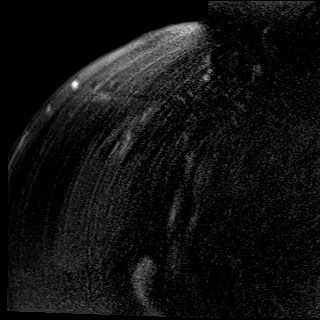
[im 11/26]
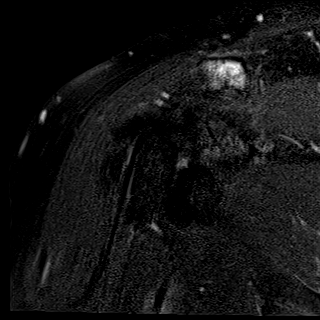
[im 16/26]
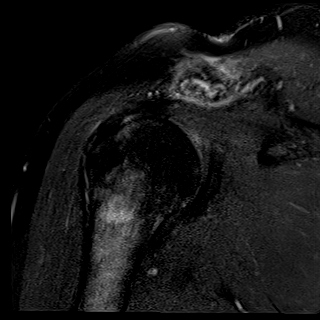
[im 21/26]
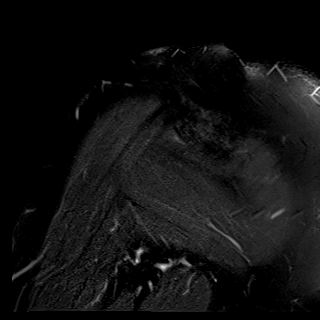
[im 26/26]
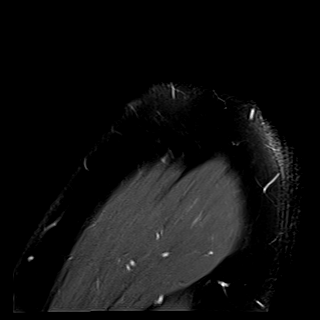

[Series 8: pd_blade_fs sag_ · oblique · right · 4.0mm · 0.59mm/px · 6 of 26 slices shown (1 of 2)]
[im 1/26]
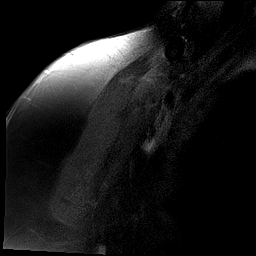
[im 6/26]
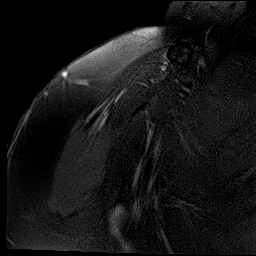
[im 11/26]
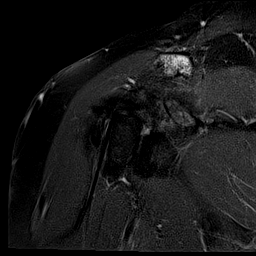
[im 16/26]
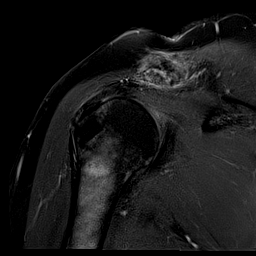
[im 21/26]
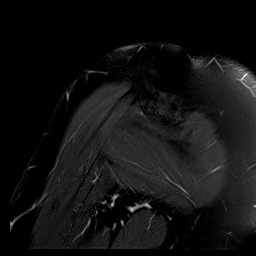
[im 26/26]
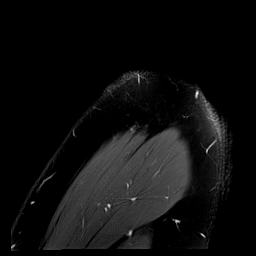

[Series 9: t2_blade_fs_cor · oblique · right · 4.0mm · 0.52mm/px · 6 of 26 slices shown]
[im 1/26]
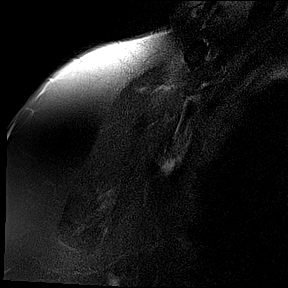
[im 6/26]
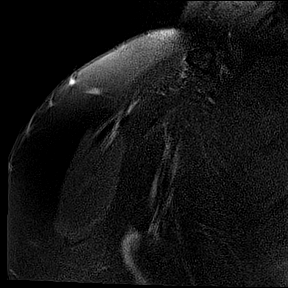
[im 11/26]
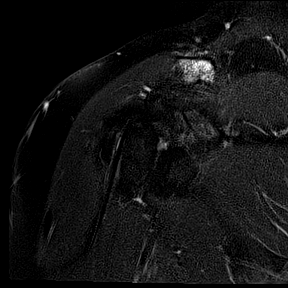
[im 16/26]
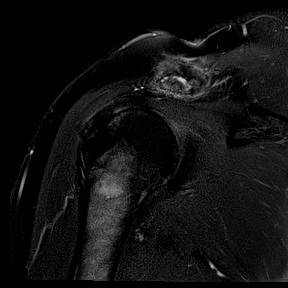
[im 21/26]
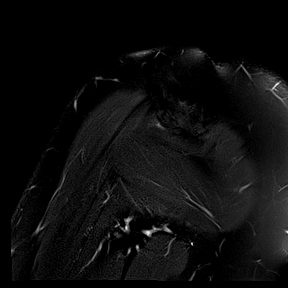
[im 26/26]
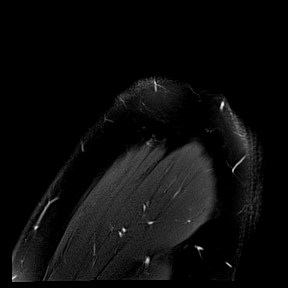

[Series 10: pd_blade_fs sag_ · oblique · right · 4.0mm · 0.55mm/px · 5 of 19 slices shown (2 of 2)]
[im 1/19]
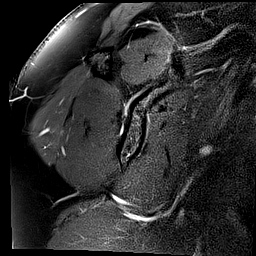
[im 5/19]
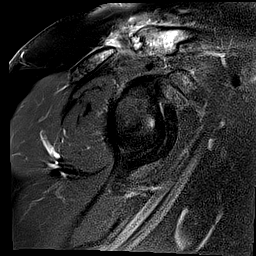
[im 10/19]
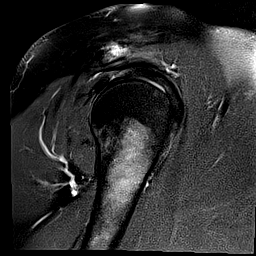
[im 14/19]
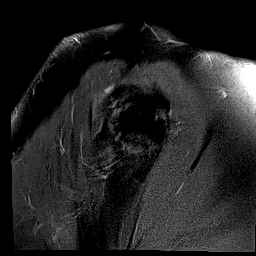
[im 19/19]
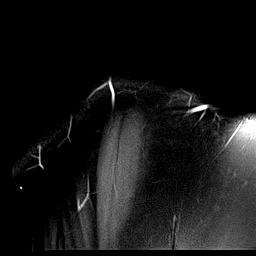

[Series 11: T1 · oblique · right · 4.0mm · 0.55mm/px · 5 of 19 slices shown]
[im 1/19]
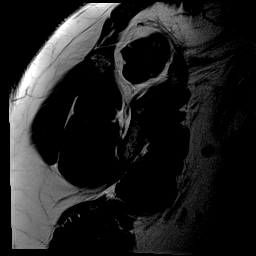
[im 5/19]
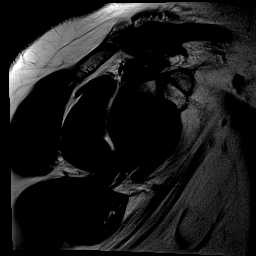
[im 10/19]
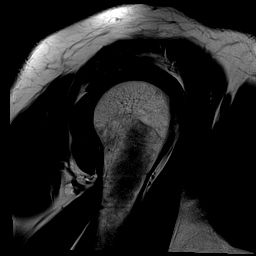
[im 14/19]
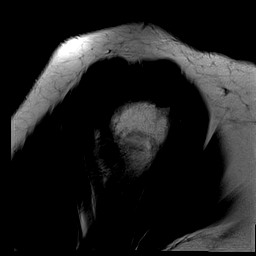
[im 19/19]
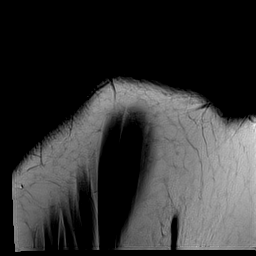

[40 of 40 positions shown; findings below may reference images not displayed]

FINDINGS: Rotator cuff: Mild supraspinatus, infraspinatus, and subscapularis
tendinosis. No rotator cuff tear. The teres minor tendon is
unremarkable.

Muscles: No atrophy or abnormal signal of the muscles of the rotator
cuff.

Biceps long head:  Intact and normally positioned.

Acromioclavicular Joint: Severe arthropathy of the acromioclavicular
joint. Type I acromion. No subacromial/subdeltoid bursal fluid.

Glenohumeral Joint: No joint effusion. No chondral defect.

Labrum: Grossly intact, but evaluation is limited by lack of
intraarticular fluid.

Bones: Prominent degenerative periarticular marrow edema of the
distal clavicle and acromion. No acute fracture or dislocation.
Reactive cystic changes in the anterior greater tuberosity. No focal
bone lesion.

Other: None.
IMPRESSION: 1. Severe acromioclavicular osteoarthritis.
2. Mild rotator cuff tendinosis.  No tear.

## 2019-01-01 ENCOUNTER — Other Ambulatory Visit: Payer: Self-pay

## 2019-01-01 ENCOUNTER — Encounter: Payer: Self-pay | Admitting: *Deleted

## 2019-01-04 ENCOUNTER — Encounter
Admission: RE | Disposition: A | Discharge status: Home / Self Care | Payer: Self-pay | Source: Home / Self Care | Attending: Orthopedic Surgery

## 2019-01-04 ENCOUNTER — Ambulatory Visit: Payer: Medicaid Other | Admitting: Anesthesiology

## 2019-01-04 ENCOUNTER — Emergency Department
Admission: EM | Admit: 2019-01-04 | Discharge: 2019-01-04 | Disposition: A | Discharge status: Home / Self Care | Payer: Medicaid Other | Attending: Emergency Medicine | Admitting: Emergency Medicine

## 2019-01-04 ENCOUNTER — Ambulatory Visit
Admission: RE | Admit: 2019-01-04 | Discharge: 2019-01-04 | Disposition: A | Discharge status: Home / Self Care | Payer: Medicaid Other | Attending: Orthopedic Surgery | Admitting: Orthopedic Surgery

## 2019-01-04 ENCOUNTER — Emergency Department: Payer: Medicaid Other

## 2019-01-04 DIAGNOSIS — R7303 Prediabetes: Secondary | ICD-10-CM | POA: Insufficient documentation

## 2019-01-04 DIAGNOSIS — E785 Hyperlipidemia, unspecified: Secondary | ICD-10-CM | POA: Insufficient documentation

## 2019-01-04 DIAGNOSIS — E119 Type 2 diabetes mellitus without complications: Secondary | ICD-10-CM | POA: Insufficient documentation

## 2019-01-04 DIAGNOSIS — E039 Hypothyroidism, unspecified: Secondary | ICD-10-CM | POA: Diagnosis not present

## 2019-01-04 DIAGNOSIS — M19011 Primary osteoarthritis, right shoulder: Secondary | ICD-10-CM | POA: Diagnosis not present

## 2019-01-04 DIAGNOSIS — M7521 Bicipital tendinitis, right shoulder: Secondary | ICD-10-CM | POA: Diagnosis not present

## 2019-01-04 DIAGNOSIS — Z79899 Other long term (current) drug therapy: Secondary | ICD-10-CM | POA: Insufficient documentation

## 2019-01-04 DIAGNOSIS — M25811 Other specified joint disorders, right shoulder: Secondary | ICD-10-CM | POA: Insufficient documentation

## 2019-01-04 DIAGNOSIS — F329 Major depressive disorder, single episode, unspecified: Secondary | ICD-10-CM | POA: Insufficient documentation

## 2019-01-04 DIAGNOSIS — Z87891 Personal history of nicotine dependence: Secondary | ICD-10-CM | POA: Diagnosis not present

## 2019-01-04 DIAGNOSIS — R0600 Dyspnea, unspecified: Secondary | ICD-10-CM | POA: Diagnosis not present

## 2019-01-04 DIAGNOSIS — Z794 Long term (current) use of insulin: Secondary | ICD-10-CM | POA: Diagnosis not present

## 2019-01-04 DIAGNOSIS — J449 Chronic obstructive pulmonary disease, unspecified: Secondary | ICD-10-CM | POA: Insufficient documentation

## 2019-01-04 DIAGNOSIS — R0602 Shortness of breath: Secondary | ICD-10-CM

## 2019-01-04 DIAGNOSIS — Z7982 Long term (current) use of aspirin: Secondary | ICD-10-CM | POA: Diagnosis not present

## 2019-01-04 DIAGNOSIS — F419 Anxiety disorder, unspecified: Secondary | ICD-10-CM | POA: Insufficient documentation

## 2019-01-04 HISTORY — DX: Meningitis, unspecified: G03.9

## 2019-01-04 HISTORY — PX: SHOULDER ARTHROSCOPY: SHX128

## 2019-01-04 HISTORY — DX: Presence of dental prosthetic device (complete) (partial): Z97.2

## 2019-01-04 HISTORY — DX: Migraine, unspecified, not intractable, without status migrainosus: G43.909

## 2019-01-04 LAB — GLUCOSE, CAPILLARY
GLUCOSE-CAPILLARY: 134 mg/dL — AB (ref 70–99)
Glucose-Capillary: 116 mg/dL — ABNORMAL HIGH (ref 70–99)

## 2019-01-04 LAB — BASIC METABOLIC PANEL
Anion gap: 10 (ref 5–15)
BUN: 13 mg/dL (ref 6–20)
CO2: 22 mmol/L (ref 22–32)
Calcium: 8.9 mg/dL (ref 8.9–10.3)
Chloride: 102 mmol/L (ref 98–111)
Creatinine, Ser: 0.88 mg/dL (ref 0.44–1.00)
GFR calc Af Amer: >60 mL/min (ref >60)
GLUCOSE: 223 mg/dL — AB (ref 70–99)
Potassium: 4.3 mmol/L (ref 3.5–5.1)
Sodium: 134 mmol/L — ABNORMAL LOW (ref 135–145)

## 2019-01-04 LAB — CBC
HCT: 41.3 % (ref 36.0–46.0)
Hemoglobin: 13.5 g/dL (ref 12.0–15.0)
MCH: 29.3 pg (ref 26.0–34.0)
MCHC: 32.7 g/dL (ref 30.0–36.0)
MCV: 89.8 fL (ref 80.0–100.0)
Platelets: 428 10*3/uL — ABNORMAL HIGH (ref 150–400)
RBC: 4.6 MIL/uL (ref 3.87–5.11)
RDW: 13.7 % (ref 11.5–15.5)
WBC: 19.8 10*3/uL — AB (ref 4.0–10.5)
nRBC: 0 % (ref 0.0–0.2)

## 2019-01-04 LAB — TROPONIN I: Troponin I: <0.03 ng/mL (ref <0.03)

## 2019-01-04 SURGERY — ARTHROSCOPY, SHOULDER
Anesthesia: Regional | Site: Shoulder | Laterality: Right

## 2019-01-04 MED ORDER — FENTANYL CITRATE (PF) 100 MCG/2ML IJ SOLN
INTRAMUSCULAR | Status: DC | PRN
  Administered 2019-01-04: 100 ug via INTRAVENOUS

## 2019-01-04 MED ORDER — DEXAMETHASONE SODIUM PHOSPHATE 4 MG/ML IJ SOLN
INTRAMUSCULAR | Status: DC | PRN
  Administered 2019-01-04: 4 mg via INTRAVENOUS

## 2019-01-04 MED ORDER — CEFAZOLIN SODIUM 1 G IJ SOLR
2000.0000 mg | Freq: Once | INTRAMUSCULAR | Status: AC
  Administered 2019-01-04: 2000 mg via INTRAVENOUS

## 2019-01-04 MED ORDER — BUPIVACAINE HCL (PF) 0.5 % IJ SOLN
INTRAMUSCULAR | Status: DC | PRN
  Administered 2019-01-04 (×2): 10 mL via PERINEURAL

## 2019-01-04 MED ORDER — BUPIVACAINE LIPOSOME 1.3 % IJ SUSP
INTRAMUSCULAR | Status: DC | PRN
  Administered 2019-01-04: 10 mL
  Administered 2019-01-04: 10 mL via PERINEURAL

## 2019-01-04 MED ORDER — LORAZEPAM 2 MG/ML IJ SOLN
0.5000 mg | Freq: Once | INTRAMUSCULAR | Status: AC
  Administered 2019-01-04: 0.5 mg via INTRAVENOUS
  Filled 2019-01-04: qty 1

## 2019-01-04 MED ORDER — ONDANSETRON 4 MG PO TBDP: 4.0000 mg | ORAL_TABLET | Freq: Three times a day (TID) | ORAL | 0 refills | Status: DC | PRN

## 2019-01-04 MED ORDER — ASPIRIN EC 325 MG PO TBEC: 325.0000 mg | DELAYED_RELEASE_TABLET | Freq: Every day | ORAL | 0 refills | Status: AC

## 2019-01-04 MED ORDER — MIDAZOLAM HCL 2 MG/2ML IJ SOLN
INTRAMUSCULAR | Status: DC | PRN
  Administered 2019-01-04: 2 mg via INTRAVENOUS

## 2019-01-04 MED ORDER — PROPOFOL 10 MG/ML IV BOLUS
INTRAVENOUS | Status: DC | PRN
  Administered 2019-01-04: 200 mg via INTRAVENOUS

## 2019-01-04 MED ORDER — ONDANSETRON HCL 4 MG/2ML IJ SOLN
4.0000 mg | Freq: Once | INTRAMUSCULAR | Status: AC | PRN
  Administered 2019-01-04: 4 mg via INTRAVENOUS

## 2019-01-04 MED ORDER — OXYCODONE HCL 5 MG PO TABS: 5.0000 mg | ORAL_TABLET | ORAL | 0 refills | Status: DC | PRN

## 2019-01-04 MED ORDER — OXYCODONE HCL 5 MG PO TABS
5.0000 mg | ORAL_TABLET | Freq: Once | ORAL | Status: AC
  Administered 2019-01-04: 5 mg via ORAL
  Filled 2019-01-04: qty 1

## 2019-01-04 MED ORDER — LACTATED RINGERS IV SOLN
INTRAVENOUS | Status: DC | PRN
  Administered 2019-01-04: 10 mL

## 2019-01-04 MED ORDER — LACTATED RINGERS IV SOLN
10.0000 mL/h | INTRAVENOUS | Status: DC
  Administered 2019-01-04: 10 mL/h via INTRAVENOUS

## 2019-01-04 MED ORDER — ACETAMINOPHEN 500 MG PO TABS: 1000.0000 mg | ORAL_TABLET | Freq: Three times a day (TID) | ORAL | 2 refills | Status: DC

## 2019-01-04 MED ORDER — ONDANSETRON HCL 4 MG/2ML IJ SOLN
INTRAMUSCULAR | Status: DC | PRN
  Administered 2019-01-04: 4 mg via INTRAVENOUS

## 2019-01-04 SURGICAL SUPPLY — 53 items
ADAPTER IRRIG TUBE 2 SPIKE SOL (ADAPTER) ×6 IMPLANT
BUR RADIUS 4.0X18.5 (BURR) ×2 IMPLANT
BUR RADIUS 5.5 (BURR) ×3 IMPLANT
CANNULA 5.75X7 CRYSTAL CLEAR (CANNULA) ×3 IMPLANT
CHLORAPREP W/TINT 26ML (MISCELLANEOUS) ×3 IMPLANT
COOLER POLAR GLACIER W/PUMP (MISCELLANEOUS) ×3 IMPLANT
COVER LIGHT HANDLE UNIVERSAL (MISCELLANEOUS) ×6 IMPLANT
DERMABOND ADVANCED (GAUZE/BANDAGES/DRESSINGS)
DERMABOND ADVANCED .7 DNX12 (GAUZE/BANDAGES/DRESSINGS) ×1 IMPLANT
DRAPE IMP U-DRAPE 54X76 (DRAPES) ×6 IMPLANT
DRAPE INCISE IOBAN 66X45 STRL (DRAPES) ×3 IMPLANT
DRAPE SHEET LG 3/4 BI-LAMINATE (DRAPES) ×3 IMPLANT
DRAPE U-SHAPE 48X52 POLY STRL (PACKS) ×6 IMPLANT
DRSG TEGADERM 4X4.75 (GAUZE/BANDAGES/DRESSINGS) ×9 IMPLANT
ELECT REM PT RETURN 9FT ADLT (ELECTROSURGICAL) ×3
ELECTRODE REM PT RTRN 9FT ADLT (ELECTROSURGICAL) ×1 IMPLANT
GAUZE PETRO XEROFOAM 1X8 (MISCELLANEOUS) ×2 IMPLANT
GAUZE SPONGE 4X4 12PLY STRL (GAUZE/BANDAGES/DRESSINGS) ×1 IMPLANT
GLOVE BIO SURGEON STRL SZ7.5 (GLOVE) ×8 IMPLANT
GLOVE BIOGEL PI IND STRL 8 (GLOVE) ×1 IMPLANT
GLOVE BIOGEL PI INDICATOR 8 (GLOVE) ×4
GOWN STRL REIN 2XL XLG LVL4 (GOWN DISPOSABLE) ×3 IMPLANT
GOWN STRL REUS W/ TWL LRG LVL3 (GOWN DISPOSABLE) ×1 IMPLANT
GOWN STRL REUS W/TWL LRG LVL3 (GOWN DISPOSABLE) ×2
GOWN STRL REUS W/TWL LRG LVL4 (GOWN DISPOSABLE) IMPLANT
IV LACTATED RINGER IRRG 3000ML (IV SOLUTION) ×16
IV LR IRRIG 3000ML ARTHROMATIC (IV SOLUTION) ×8 IMPLANT
KIT STABILIZATION SHOULDER (MISCELLANEOUS) ×3 IMPLANT
KIT TURNOVER KIT A (KITS) ×3 IMPLANT
MANIFOLD NEPTUNE II (INSTRUMENTS) ×3 IMPLANT
MASK FACE SPIDER DISP (MASK) ×3 IMPLANT
MAT GRAY ABSORB FLUID 28X50 (MISCELLANEOUS) ×6 IMPLANT
NDL SAFETY ECLIPSE 18X1.5 (NEEDLE) ×1 IMPLANT
NEEDLE HYPO 18GX1.5 SHARP (NEEDLE) ×2
PACK ARTHROSCOPY SHOULDER (MISCELLANEOUS) ×3 IMPLANT
PAD WRAPON POLAR SHDR UNIV (MISCELLANEOUS) ×1 IMPLANT
PENCIL SMOKE EVACUATOR (MISCELLANEOUS) ×3 IMPLANT
SET TUBE SUCT SHAVER OUTFL 24K (TUBING) ×3 IMPLANT
SET TUBE TIP INTRA-ARTICULAR (MISCELLANEOUS) ×3 IMPLANT
SPONGE GAUZE 2X2 8PLY STER LF (GAUZE/BANDAGES/DRESSINGS) ×3
SPONGE GAUZE 2X2 8PLY STRL LF (GAUZE/BANDAGES/DRESSINGS) ×6 IMPLANT
SUT ETHILON 3-0 FS-10 30 BLK (SUTURE)
SUT MNCRL 4-0 (SUTURE)
SUT MNCRL 4-0 27XMFL (SUTURE)
SUT VIC AB 0 CT1 36 (SUTURE) ×1 IMPLANT
SUT VIC AB 2-0 CT2 27 (SUTURE) IMPLANT
SUTURE EHLN 3-0 FS-10 30 BLK (SUTURE) ×1 IMPLANT
SUTURE MNCRL 4-0 27XMF (SUTURE) ×1 IMPLANT
SYR 10ML LL (SYRINGE) ×3 IMPLANT
TAPE MICROFOAM 4IN (TAPE) ×2 IMPLANT
TUBING ARTHRO INFLOW-ONLY STRL (TUBING) ×3 IMPLANT
WAND WEREWOLF FLOW 90D (MISCELLANEOUS) ×2 IMPLANT
WRAPON POLAR PAD SHDR UNIV (MISCELLANEOUS) ×3

## 2019-01-04 NOTE — Progress Notes (Signed)
Assisted Debabtrata Maji ANMD with right, ultrasound guided, supraclavicular block. Side rails up, monitors on throughout procedure. See vital signs in flow sheet. Tolerated Procedure well.

## 2019-01-04 NOTE — Op Note (Signed)
SURGERY DATE: 01/04/2019  PRE-OP DIAGNOSIS:  1. Right acromioclavicular joint osteoarthritis 2. Right biceps tendinopathy 3. Right subacromial impingement  POST-OP DIAGNOSIS: 1. Right acromioclavicular joint osteoarthritis 2. Right biceps tendinopathy 3. Right subacromial impingement   PROCEDURES:  1. Right biceps tenotomy 2. Right arthroscopic distal clavicle excision 3. Right extensive debridement of shoulder (glenohumeral and subacromial spaces) 4. Right subacromial decompression  SURGEON: Cato Mulligan, MD  ANESTHESIA: Gen with Exparel interscalene block  ESTIMATED BLOOD LOSS: 10cc  DRAINS:  none  TOTAL IV FLUIDS: per anesthesia   SPECIMENS: none  IMPLANTS: none  OPERATIVE FINDINGS:  Examination under anesthesia: A careful examination under anesthesia was performed.  Passive range of motion was: FF: 160; ER at side: 50; ER in abduction: 90; IR in abduction: 50.  Anterior load shift: NT.  Posterior load shift: NT.  Sulcus in neutral: NT.  Sulcus in ER: NT.    Intra-operative findings: A thorough arthroscopic examination of the shoulder was performed.  The findings are: 1. Biceps tendon: tendinopathy with yellowish degenerative appearing tendon 2. Superior labrum: Type II SLAP tear 3. Posterior labrum and capsule: normal 4. Inferior capsule and inferior recess: normal 5. Glenoid cartilage surface: normal 6. Supraspinatus attachment: Minimal fraying of the anterior supraspinatus intra-articularly 7. Posterior rotator cuff attachment: normal 8. Humeral head articular cartilage: normal 9. Rotator interval: significant synovitis 10: Subscapularis tendon: attachment intact 11. Anterior labrum: degenerative tissue 12. IGHL: normal  OPERATIVE REPORT:   Indications for procedure: Monica Terry is a 53 y.o. female with chronic shoulder pain.  Clinical exam and an MRI was obtained.  Findings are concerning for significant AC joint arthropathy with surrounding bone marrow  edema as well as subacromial bursitis.  She underwent an ultrasound-guided acromioclavicular joint injection on the left side by Dr. Candelaria Stagers and partial relief of symptoms from this, although it was short-lived.  She also ultrasound-guided subacromial bursa injection, also with partial relief of her symptoms.  Clinically, most of her pain was over her [facility] joint itself with mild pain over her biceps tendon. After discussion of risks, benefits, and alternatives to surgery, the patient elected to proceed with above procedures.  She understands that given the partial relief with each of the injections, she may not receive complete relief of pain from this surgery.   Procedure in detail:  I identified the patient in the pre-operative holding area.  I marked the operative shoulder with my initials. I reviewed the risks and benefits of the proposed surgical intervention, and the patient (and/or patient's guardian) wished to proceed.  Anesthesia was then performed with an interscalene block with Exparel.  The patient was transferred to the operative suite and placed in the beach chair position.    SCDs were placed on the lower extremities. Appropriate IV antibiotics were administered prior to incision. The operative upper extremity was then prepped and draped in standard fashion. A time out was performed confirming the correct extremity, correct patient, and correct procedure.   I then created a standard posterior portal with an 11 blade. The glenohumeral joint was easily entered with a blunt trochar and the arthroscope introduced. The findings of diagnostic arthroscopy are described above. I debrided degenerative tissue including the synovitic tissue about the rotator interval, the anterior labrum, superior labrum, and anterior supraspinatus. I then coagulated the inflamed synovium to obtain hemostasis and reduce the risk of post-operative swelling using an Arthrocare radiofrequency device. I performed a biceps  tenotomy using an arthroscopic scissors and used a motorized shaver to debride  the stump back to a stable base.   Next, the arthroscope was then introduced into the subacromial space. A direct lateral portal was created with an 11-blade after spinal needle localization. An extensive subacromial bursectomy was performed using a combination of the shaver and Arthrocare wand. The entire acromial undersurface was exposed and the CA ligament was subperiosteally elevated to expose the anterior acromial hook. A 5.40mm barrel burr was used to create a flat anterior and lateral aspect of the acromion, converting it from a Type 2 to a Type 1 acromion. Care was made to keep the deltoid fascia intact.  I then turned my attention to the arthroscopic distal clavicle excision. I identified the acromioclavicular joint. Surrounding bursal tissue was debrided and the edges of the joint were identified. I used the 5.47mm barrel burr to remove the distal clavicle parallel to the edge of the acromion. I was able to fit two widths of the burr into the space between the distal clavicle and acromion, signifying that I had removed ~40mm of distal clavicle.  This was confirmed using a probe to measure the space between the distal clavicle and acromion.  Hemostasis was achieved with an Arthrocare wand.   Fluid was evacuated from the shoulder, and the portals were closed with 3-0 Nylon. Xeroform was applied to the portals. A sterile dressing was applied, followed by a Polar Care sleeve and a SlingShot shoulder immobilizer/sling. The patient was awakened from anesthesia without difficulty and was transferred to the PACU in stable condition.     COMPLICATIONS: none  DISPOSITION: plan for discharge home after recovery in PACU   POSTOPERATIVE PLAN: Remain in sling (except hygiene and elbow/wrist/hand RoM exercises as instructed by PT) for 1-2 weeks until able to self-wean. Advance of ROM as tolerated. PT to begin 3-4 days after  surgery. Distal clavicle excision/subacromiald decompression rehab protocol.

## 2019-01-04 NOTE — Anesthesia Postprocedure Evaluation (Signed)
Anesthesia Post Note  Patient: Monica Terry  Procedure(s) Performed: ARTHROSCOPY SHOULDER WITH ARTHROSCOPIC  DISTAL CLAVICLE EXCISION, SUBACROMIAL AND DECOMPRESSION BICEPS TENOTOMY (Right Shoulder)  Patient location during evaluation: PACU Anesthesia Type: Regional Level of consciousness: awake and alert Pain management: pain level controlled Vital Signs Assessment: post-procedure vital signs reviewed and stable Respiratory status: spontaneous breathing, nonlabored ventilation, respiratory function stable and patient connected to nasal cannula oxygen Cardiovascular status: blood pressure returned to baseline and stable Postop Assessment: no apparent nausea or vomiting Anesthetic complications: no    Renee Erb
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
## 2019-01-04 NOTE — Transfer of Care (Signed)
Immediate Anesthesia Transfer of Care Note  Patient: Monica Terry  Procedure(s) Performed: ARTHROSCOPY SHOULDER WITH ARTHROSCOPIC  DISTAL CLAVICLE EXCISION, SUBACROMIAL AND DECOMPRESSION BICEPS TENOTOMY (Right Shoulder)  Patient Location: PACU  Anesthesia Type: General, Regional  Level of Consciousness: awake, alert  and patient cooperative  Airway and Oxygen Therapy: Patient Spontanous Breathing and Patient connected to supplemental oxygen  Post-op Assessment: Post-op Vital signs reviewed, Patient's Cardiovascular Status Stable, Respiratory Function Stable, Patent Airway and No signs of Nausea or vomiting  Post-op Vital Signs: Reviewed and stable  Complications: No apparent anesthesia complications

## 2019-01-04 NOTE — H&P (Signed)
Paper H&P to be scanned into permanent record. H&P reviewed. No significant changes noted.  

## 2019-01-04 NOTE — Discharge Instructions (Signed)
Post-Op Instructions - Arthroscopic Subacromial Decompression and/or Distal Clavicle Excision  1. Bracing: You will wear a shoulder immobilizer or sling for no longer than 2 weeks. You can self wean from sling when it is comfortable to do so.   2. Driving: No driving for at least 2 weeks post-op.   3. Activity: Progress to motion as tolerated, moving from passive to active-assisted to active motion. Goal for motion by 4 weeks is 140 degrees forward flexion, 40 ER, IR behind back. Avoid abduction and ER/IR until after 4 weeks.  Return to daily activities normally takes 2-3 months on average.  4. Physical Therapy: Begins 3-4 days after surgery  5. Medications:  - You will be provided a prescription for narcotic pain medicine. After surgery, take 1-2 narcotic tablets every 4 hours if needed for severe pain.  - A prescription for anti-nausea medication will be provided in case the narcotic medicine causes nausea - take 1 tablet every 6 hours only if nauseated.   - Take ASA 325mg /day x 2 weeks to help prevent DVTs/PEs (blood clots).  - Take ibuprofen 800mg  three times/day with food. Stop if there is any GI irritation.   If you are taking prescription medication for anxiety, depression, insomnia, muscle spasm, chronic pain, or for attention deficit disorder, you are advised that you are at a higher risk of adverse effects with use of narcotics post-op, including narcotic addiction/dependence, depressed breathing, death. If you use non-prescribed substances: alcohol, marijuana, cocaine, heroin, methamphetamines, etc., you are at a higher risk of adverse effects with use of narcotics post-op, including narcotic addiction/dependence, depressed breathing, death. You are advised that taking > 50 morphine milligram equivalents (MME) of narcotic pain medication per day results in twice the risk of overdose or death. For your prescription provided: hydrocodone 5 mg - taking more than 8 tablets per day would  result in > 50 morphine milligram equivalents (MME) of narcotic pain medication. Be advised that we will prescribe narcotics short-term, for acute post-operative pain only - 3 weeks for major operations such as shoulder repair/reconstruction surgeries.    6. Post-Op Appointment:  Your first post-op appointment will be 10-14 days post-op.  7. Work or School: For most, but not all procedures, we advise staying out of work or school for at least 1 to 2 weeks in order to recover from the stress of surgery and to allow time for healing.   If you need a work or school note this can be provided.   8. Smoking: If you are a smoker, you need to refrain from smoking in the postoperative period. The nicotine in cigarettes will inhibit healing of your shoulder repair and decrease the chance of successful repair. Similarly, nicotine containing products (gum, patches) should be avoided.   Post-operative Brace: Apply and remove the brace you received as you were instructed to at the time of fitting and as described in detail as the braces instructions for use indicate.  Wear the brace for the period of time prescribed by your physician.  The brace can be cleaned with soap and water and allowed to air dry only.  Should the brace result in increased pain, decreased feeling (numbness/tingling), increased swelling or an overall worsening of your medical condition, please contact your doctor immediately.  If an emergency situation occurs as a result of wearing the brace after normal business hours, please dial 911 and seek immediate medical attention.  Let your doctor know if you have any further questions about the brace issued  to you. Refer to the shoulder sling instructions for use if you have any questions regarding the correct fit of your shoulder sling.  [place] for Troubleshooting: [phone]  Video that illustrates how to properly use a shoulder sling: "Instructions for Proper Use of an  Orthopaedic Sling" [email or web address]   General Anesthesia, Adult, Care After This sheet gives you information about how to care for yourself after your procedure. Your health care provider may also give you more specific instructions. If you have problems or questions, contact your health care provider. What can I expect after the procedure? After the procedure, the following side effects are common:  Pain or discomfort at the IV site.  Nausea.  Vomiting.  Sore throat.  Trouble concentrating.  Feeling cold or chills.  Weak or tired.  Sleepiness and fatigue.  Soreness and body aches. These side effects can affect parts of the body that were not involved in surgery. Follow these instructions at home:  For at least 24 hours after the procedure:  Have a responsible adult stay with you. It is important to have someone help care for you until you are awake and alert.  Rest as needed.  Do not: ? Participate in activities in which you could fall or become injured. ? Drive. ? Use heavy machinery. ? Drink alcohol. ? Take sleeping pills or medicines that cause drowsiness. ? Make important decisions or sign legal documents. ? Take care of children on your own. Eating and drinking  Follow any instructions from your health care provider about eating or drinking restrictions.  When you feel hungry, start by eating small amounts of foods that are soft and easy to digest (bland), such as toast. Gradually return to your regular diet.  Drink enough fluid to keep your urine pale yellow.  If you vomit, rehydrate by drinking water, juice, or clear broth. General instructions  If you have sleep apnea, surgery and certain medicines can increase your risk for breathing problems. Follow instructions from your health care provider about wearing your sleep device: ? Anytime you are sleeping, including during daytime naps. ? While taking prescription pain  medicines, sleeping medicines, or medicines that make you drowsy.  Return to your normal activities as told by your health care provider. Ask your health care provider what activities are safe for you.  Take over-the-counter and prescription medicines only as told by your health care provider.  If you smoke, do not smoke without supervision.  Keep all follow-up visits as told by your health care provider. This is important. Contact a health care provider if:  You have nausea or vomiting that does not get better with medicine.  You cannot eat or drink without vomiting.  You have pain that does not get better with medicine.  You are unable to pass urine.  You develop a skin rash.  You have a fever.  You have redness around your IV site that gets worse. Get help right away if:  You have difficulty breathing.  You have chest pain.  You have blood in your urine or stool, or you vomit blood. Summary  After the procedure, it is common to have a sore throat or nausea. It is also common to feel tired.  Have a responsible adult stay with you for the first 24 hours after general anesthesia. It is important to have someone help care for you until you are awake and alert.  When you feel hungry, start by eating small amounts of foods that are  soft and easy to digest (bland), such as toast. Gradually return to your regular diet.  Drink enough fluid to keep your urine pale yellow.  Return to your normal activities as told by your health care provider. Ask your health care provider what activities are safe for you. This information is not intended to replace advice given to you by your health care provider. Make sure you discuss any questions you have with your health care provider. Document Released: 03/20/2001 Document Revised: 07/28/2017 Document Reviewed: 07/28/2017 Elsevier Interactive Patient Education  2019 [employer].

## 2019-01-04 NOTE — Anesthesia Preprocedure Evaluation (Addendum)
Anesthesia Evaluation  Patient identified by MRN, date of birth, ID band  Reviewed: NPO status   History of Anesthesia Complications Negative for: history of anesthetic complications  Airway Mallampati: II  TM Distance: >3 FB Neck ROM: full    Dental  (+) Upper Dentures   Pulmonary asthma , COPD (oxygen at night), former smoker,    Pulmonary exam normal        Cardiovascular negative cardio ROS Normal cardiovascular exam     Neuro/Psych  Headaches, Anxiety Depression meuropathy feet;  Chronic pain > neck fusion; negative psych ROS   GI/Hepatic GERD  Controlled,(+) Hepatitis - (cured), Cspleenectomy : age 53   Endo/Other  diabetesHypothyroidism Morbid obesity (bmi=36)  Renal/GU negative Renal ROS  negative genitourinary   Musculoskeletal  (+) Arthritis , ACDFusion.   Abdominal   Peds  Hematology negative hematology ROS (+)   Anesthesia Other Findings Pt on nortryptiline.    Reproductive/Obstetrics                             Anesthesia Physical Anesthesia Plan  ASA: III  Anesthesia Plan: Regional and General LMA   Post-op Pain Management: GA combined w/ Regional for post-op pain   Induction:   PONV Risk Score and Plan:   Airway Management Planned:   Additional Equipment:   Intra-op Plan:   Post-operative Plan:   Informed Consent: I have reviewed the patients History and Physical, chart, labs and discussed the procedure including the risks, benefits and alternatives for the proposed anesthesia with the patient or authorized representative who has indicated his/her understanding and acceptance.     Plan Discussed with: CRNA  Anesthesia Plan Comments:        Anesthesia Quick Evaluation

## 2019-01-04 NOTE — ED Triage Notes (Signed)
Patient c/o SOB beginning at approx 1200 today after coming out of surgery. Patient reports that she was discharged from [facility] with this complaint. Patient was instructed it was due to her intubation. Patient with labored breathing in triage.

## 2019-01-04 NOTE — Anesthesia Procedure Notes (Signed)
Anesthesia Regional Block: Interscalene brachial plexus block   Pre-Anesthetic Checklist: ,, timeout performed, Correct Patient, Correct Site, Correct Laterality, Correct Procedure, Correct Position, site marked, Risks and benefits discussed,  Surgical consent,  Pre-op evaluation,  At surgeon's request and post-op pain management  Laterality: Right  Prep: chloraprep       Needles:  Injection technique: Single-shot  Needle Type: Stimiplex     Needle Length: 10cm  Needle Gauge: 21     Additional Needles:   Procedures:,,,, ultrasound used (permanent image in chart),,,,  Narrative:  Start time: 01/04/2019 9:34 AM End time: 01/04/2019 9:39 AM Injection made incrementally with aspirations every 5 mL.  Performed by: Personally  Anesthesiologist: Fidel Levy, MD  Additional Notes: Functioning IV was confirmed and monitors applied. Ultrasound guidance: relevant anatomy identified, needle position confirmed, local anesthetic spread visualized around nerve(s)., vascular puncture avoided.  Image printed for medical record.  Negative aspiration and no paresthesias; incremental administration of local anesthetic. The patient tolerated the procedure well. Vitals signes recorded in RN notes.

## 2019-01-04 NOTE — Anesthesia Procedure Notes (Signed)
Procedure Name: LMA Insertion Date/Time: 01/04/2019 9:58 AM Performed by: Cameron Ali, CRNA Pre-anesthesia Checklist: Patient identified, Emergency Drugs available, Suction available, Timeout performed and Patient being monitored Patient Re-evaluated:Patient Re-evaluated prior to induction Oxygen Delivery Method: Circle system utilized Preoxygenation: Pre-oxygenation with 100% oxygen Induction Type: IV induction LMA: LMA inserted LMA Size: 4.0 Number of attempts: 1 Placement Confirmation: positive ETCO2 and breath sounds checked- equal and bilateral Tube secured with: Tape Dental Injury: Teeth and Oropharynx as per pre-operative assessment

## 2019-01-07 ENCOUNTER — Encounter: Payer: Self-pay | Admitting: Orthopedic Surgery

## 2019-02-06 ENCOUNTER — Other Ambulatory Visit: Payer: Self-pay | Admitting: Physician Assistant

## 2019-02-06 DIAGNOSIS — Z1231 Encounter for screening mammogram for malignant neoplasm of breast: Secondary | ICD-10-CM

## 2019-02-19 ENCOUNTER — Encounter: Payer: Self-pay | Admitting: Internal Medicine

## 2019-02-19 ENCOUNTER — Inpatient Hospital Stay: Payer: Medicaid Other | Attending: Internal Medicine | Admitting: Internal Medicine

## 2019-02-19 ENCOUNTER — Inpatient Hospital Stay: Payer: Medicaid Other

## 2019-02-19 DIAGNOSIS — M791 Myalgia, unspecified site: Secondary | ICD-10-CM | POA: Insufficient documentation

## 2019-02-19 DIAGNOSIS — E039 Hypothyroidism, unspecified: Secondary | ICD-10-CM | POA: Insufficient documentation

## 2019-02-19 DIAGNOSIS — D729 Disorder of white blood cells, unspecified: Secondary | ICD-10-CM

## 2019-02-19 DIAGNOSIS — D72820 Lymphocytosis (symptomatic): Secondary | ICD-10-CM

## 2019-02-19 DIAGNOSIS — E8801 Alpha-1-antitrypsin deficiency: Secondary | ICD-10-CM | POA: Insufficient documentation

## 2019-02-19 DIAGNOSIS — D72829 Elevated white blood cell count, unspecified: Secondary | ICD-10-CM

## 2019-02-19 DIAGNOSIS — G8929 Other chronic pain: Secondary | ICD-10-CM | POA: Diagnosis not present

## 2019-02-19 DIAGNOSIS — E119 Type 2 diabetes mellitus without complications: Secondary | ICD-10-CM | POA: Insufficient documentation

## 2019-02-19 DIAGNOSIS — J449 Chronic obstructive pulmonary disease, unspecified: Secondary | ICD-10-CM | POA: Diagnosis not present

## 2019-02-19 DIAGNOSIS — Z79899 Other long term (current) drug therapy: Secondary | ICD-10-CM | POA: Insufficient documentation

## 2019-02-19 DIAGNOSIS — Z87891 Personal history of nicotine dependence: Secondary | ICD-10-CM | POA: Insufficient documentation

## 2019-02-19 LAB — CBC WITH DIFFERENTIAL/PLATELET
Abs Immature Granulocytes: 0.04 10*3/uL (ref 0.00–0.07)
BASOS ABS: 0.1 10*3/uL (ref 0.0–0.1)
Basophils Relative: 1 %
EOS PCT: 3 %
Eosinophils Absolute: 0.3 10*3/uL (ref 0.0–0.5)
HCT: 40.4 % (ref 36.0–46.0)
Hemoglobin: 13.1 g/dL (ref 12.0–15.0)
Immature Granulocytes: 0 %
Lymphocytes Relative: 36 %
Lymphs Abs: 4.5 10*3/uL — ABNORMAL HIGH (ref 0.7–4.0)
MCH: 29.4 pg (ref 26.0–34.0)
MCHC: 32.4 g/dL (ref 30.0–36.0)
MCV: 90.6 fL (ref 80.0–100.0)
Monocytes Absolute: 0.6 10*3/uL (ref 0.1–1.0)
Monocytes Relative: 5 %
Neutro Abs: 7 10*3/uL (ref 1.7–7.7)
Neutrophils Relative %: 55 %
Platelets: 419 10*3/uL — ABNORMAL HIGH (ref 150–400)
RBC: 4.46 MIL/uL (ref 3.87–5.11)
RDW: 14.4 % (ref 11.5–15.5)
WBC: 12.5 10*3/uL — ABNORMAL HIGH (ref 4.0–10.5)
nRBC: 0 % (ref 0.0–0.2)

## 2019-02-19 LAB — LACTATE DEHYDROGENASE: LDH: 177 U/L (ref 98–192)

## 2019-02-19 LAB — C-REACTIVE PROTEIN: CRP: 1.7 mg/dL — ABNORMAL HIGH (ref <1.0)

## 2019-02-19 LAB — TECHNOLOGIST SMEAR REVIEW: TECH REVIEW: NORMAL

## 2019-02-19 NOTE — Assessment & Plan Note (Addendum)
#   Leucocytosis-lymphocytosis/neutrophilia.  Suspect reactive/secondary causes than primary causes.  Will check BCR ABL FISH/peripheral blood flow cytometry; CBC; LDH; also check CRP.  Check review of peripheral smear.  #Myalgias joint pains-suspicious of rheumatologic/inflammation; previously elevated CRP.  If continued elevation recommend consultation with rheumatology.  Thank you Dr.McLaughlin for allowing me to participate in the care of your pleasant patient. Please do not hesitate to contact me with questions or concerns in the interim.  # 45 minutes face-to-face with the patient discussing the above plan of care; more than 50% of time spent on prognosis/ natural history; counseling and coordination.  # DISPOSITION: # labs today # follow up in 2 weeks-MD; no labs-Dr.B

## 2019-02-19 NOTE — Progress Notes (Signed)
[facility] CONSULT NOTE  Patient Care Team: Marinda Elk, MD as PCP - General (Physician Assistant)  CHIEF COMPLAINTS/PURPOSE OF CONSULTATION: Leucocytosis  # LEUCOCYTOSIS- Mil Nuetrophilia/ lymphocytosis  #  alpha 1 tryspsin def -COPD; Hx of viral/ bacterial meningitis; Hep C s/p treatment [Dr.Wohl]; "cure"  No history exists.     HISTORY OF PRESENTING ILLNESS:  Monica Terry 53 y.o.  female longstanding history of leukocytosis has been referred to for further evaluation recommendations for leukocytosis.  Patient quit smoking 2019.  She has history of COPD secondary to alpha-1 antitrypsin deficiency.  Patient has not had recent infections.  No steroids.  Denies any new medications.  No significant night sweats.  No significant weight loss.  Patient had chest x-ray in January 2020 that did not show any acute process.  Patient complains of chronic back pain joint pains myalgias.  Review of Systems  Constitutional: Positive for malaise/fatigue and weight loss. Negative for chills, diaphoresis and fever.  HENT: Negative for nosebleeds and sore throat.   Eyes: Negative for double vision.  Respiratory: Negative for cough, hemoptysis, sputum production, shortness of breath and wheezing.   Cardiovascular: Negative for chest pain, palpitations, orthopnea and leg swelling.  Gastrointestinal: Negative for abdominal pain, blood in stool, constipation, diarrhea, heartburn, melena, nausea and vomiting.  Genitourinary: Negative for dysuria, frequency and urgency.  Musculoskeletal: Positive for back pain, joint pain and myalgias.  Skin: Negative.  Negative for itching and rash.  Neurological: Negative for dizziness, tingling, focal weakness, weakness and headaches.  Endo/Heme/Allergies: Does not bruise/bleed easily.  Psychiatric/Behavioral: Negative for depression. The patient is not nervous/anxious and does not have insomnia.      MEDICAL HISTORY:  Past Medical  History:  Diagnosis Date  . Abnormal uterine bleeding   . Anemia   . Anxiety   . Arthritis    NECK  . Bell's palsy    right side  . Bipolar 1 disorder ([place])   . Brittle bones   . COPD (chronic obstructive pulmonary disease) ([place])   . Depression   . Diabetes mellitus without complication ([place])   . GERD (gastroesophageal reflux disease)   . Heart murmur   . Hepatitis 2016   c: treated by Dr. Allen Norris ([place])  . Hypercholesteremia   . Hypothyroidism   . Meningitis 01/20/2017  . Migraine headache    after meningitis.  none since starting nortriptyline  . Neck pain   . Neck pain, chronic   . Panic attack   . Sciatica   . Shortness of breath dyspnea   . Thyroid disease   . Wears dentures    full upper    SURGICAL HISTORY: Past Surgical History:  Procedure Laterality Date  . ABDOMINAL HYSTERECTOMY    . CARPAL TUNNEL RELEASE Right 11/22/2017   Procedure: CARPAL TUNNEL RELEASE;  Surgeon: Leanor Kail, MD;  Location: [facility];  Service: Orthopedics;  Laterality: Right;  . COLONOSCOPY WITH PROPOFOL N/A 03/27/2018   Procedure: COLONOSCOPY WITH PROPOFOL;  Surgeon: Toledo, Benay Pike, MD;  Location: [facility];  Service: Gastroenterology;  Laterality: N/A;  . ESOPHAGOGASTRODUODENOSCOPY (EGD) WITH PROPOFOL N/A 03/27/2018   Procedure: ESOPHAGOGASTRODUODENOSCOPY (EGD) WITH PROPOFOL;  Surgeon: Toledo, Benay Pike, MD;  Location: [facility];  Service: Gastroenterology;  Laterality: N/A;  . HYSTEROSCOPY W/D&C N/A 02/08/2016   Procedure: DILATATION AND CURETTAGE /HYSTEROSCOPY;  Surgeon: Brayton Mars, MD;  Location: [facility];  Service: Gynecology;  Laterality: N/A;  . NECK SURGERY  2009   failed fusion  . SALPINGOOPHORECTOMY  Bilateral 04/11/2016   Procedure: SALPINGO OOPHORECTOMY;  Surgeon: Brayton Mars, MD;  Location: [facility];  Service: Gynecology;  Laterality: Bilateral;  . SHOULDER ARTHROSCOPY Right 01/04/2019   Procedure: ARTHROSCOPY SHOULDER WITH ARTHROSCOPIC  DISTAL  CLAVICLE EXCISION, SUBACROMIAL AND DECOMPRESSION BICEPS TENOTOMY;  Surgeon: Leim Fabry, MD;  Location: [place];  Service: Orthopedics;  Laterality: Right;  TODD MUNDY ASSISTING ARTHROCARE WAND FLOW 90 WAND BARREL [place] CHAIR WITH SPYDER SLING AFTER SURGERY Diabetic - injectible  . spinal meningitis  12/2016  . spleenectomy    . TUBAL LIGATION    . VAGINAL HYSTERECTOMY Bilateral 04/11/2016   Procedure: HYSTERECTOMY VAGINAL;  Surgeon: Brayton Mars, MD;  Location: [facility];  Service: Gynecology;  Laterality: Bilateral;    SOCIAL HISTORY: Social History   Socioeconomic History  . Marital status: Single    Spouse name: Not on file  . Number of children: Not on file  . Years of education: Not on file  . Highest education level: Not on file  Occupational History  . Not on file  Social Needs  . Financial resource strain: Not on file  . Food insecurity:    Worry: Not on file    Inability: Not on file  . Transportation needs:    Medical: Not on file    Non-medical: Not on file  Tobacco Use  . Smoking status: Former Smoker    Packs/day: 0.10    Years: 41.00    Pack years: 4.10    Types: Cigarettes    Start date: 02/08/1974    Last attempt to quit: 01/26/2018    Years since quitting: 1.0  . Smokeless tobacco: Never Used  Substance and Sexual Activity  . Alcohol use: Yes    Comment: occas (1-2x/yr)  . Drug use: No    Comment: CPD oil  . Sexual activity: Not Currently    Birth control/protection: Surgical  Lifestyle  . Physical activity:    Days per week: Not on file    Minutes per session: Not on file  . Stress: Not on file  Relationships  . Social connections:    Talks on phone: Not on file    Gets together: Not on file    Attends religious service: Not on file    Active member of club or organization: Not on file    Attends meetings of clubs or organizations: Not on file    Relationship status: Not on file  . Intimate partner violence:     Fear of current or ex partner: Not on file    Emotionally abused: Not on file    Physically abused: Not on file    Forced sexual activity: Not on file  Other Topics Concern  . 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     FAMILY HISTORY: Family History  Problem Relation Age of Onset  . Diabetes Mother   . Heart disease Mother   . Cervical cancer Sister   . Diabetes Maternal Grandmother   . Colon cancer Maternal Grandfather   . Breast cancer Neg Hx   . Ovarian cancer Neg Hx     ALLERGIES:  has No Known Allergies.  MEDICATIONS:  Current Outpatient Medications  Medication Sig Dispense Refill  . acetaminophen (TYLENOL) 500 MG tablet Take 2 tablets (1,000 mg total) by mouth every 8 (eight) hours. 90 tablet 2  . acyclovir ointment (ZOVIRAX) 5 % Apply  1 application topically 5 (five) times daily as needed.    Marland Kitchen albuterol (ACCUNEB) 1.25 MG/3ML nebulizer solution Take 1 ampule by nebulization every 6 (six) hours as needed for wheezing.    Marland Kitchen albuterol (PROVENTIL HFA;VENTOLIN HFA) 108 (90 Base) MCG/ACT inhaler Inhale into the lungs every 6 (six) hours as needed for wheezing or shortness of breath.    Marland Kitchen alendronate (FOSAMAX) 70 MG tablet Take 70 mg every Tuesday by mouth. Take with a full glass of water on an empty stomach.     Marland Kitchen artificial tears (LACRILUBE) OINT ophthalmic ointment Place into the right eye at bedtime as needed for dry eyes. 1 Tube 0  . buPROPion (WELLBUTRIN XL) 150 MG 24 hr tablet Take 150 mg daily by mouth.    . cetirizine (ZYRTEC) 10 MG tablet Take 10 mg by mouth daily.     Marland Kitchen docusate sodium (COLACE) 100 MG capsule TAKE 1 CAPSULE BY MOUTH TWICE DAILY (Patient taking differently: TAKE 100 MG BY MOUTH TWICE DAILY) 30 capsule 1  . estradiol (ESTRACE) 1 MG tablet Take 1 tablet (1 mg total) by mouth daily. 30 tablet 12  . fluticasone (FLONASE) 50 MCG/ACT nasal spray Place 1 spray into  both nostrils daily. (Patient taking differently: Place 2 sprays daily as needed into both nostrils for allergies. ) 1 g 0  . Fluticasone-Salmeterol (ADVAIR DISKUS) 250-50 MCG/DOSE AEPB Inhale 2 puffs 2 (two) times daily into the lungs.     . gabapentin (NEURONTIN) 100 MG capsule Take 100 mg 2 (two) times daily as needed by mouth (for pain).     Marland Kitchen levothyroxine (SYNTHROID, LEVOTHROID) 175 MCG tablet Take 175 mcg daily before breakfast by mouth.     . lidocaine (XYLOCAINE) 5 % ointment Apply 1 application 2 (two) times daily as needed topically for moderate pain.     Marland Kitchen liraglutide (VICTOZA) 18 MG/3ML SOPN Inject 0.6 mg into the skin 1 day or 1 dose.    . meloxicam (MOBIC) 15 MG tablet Take 15 mg daily by mouth.    . montelukast (SINGULAIR) 10 MG tablet Take 10 mg at bedtime by mouth.    . NON FORMULARY Apply 1 g 2 (two) times daily topically. Baclofen5%/Diclofenac3%/Gabapentin10% 120 gm    . nortriptyline (PAMELOR) 10 MG capsule Take 10 mg by mouth at bedtime.    . ondansetron (ZOFRAN ODT) 4 MG disintegrating tablet Take 1 tablet (4 mg total) by mouth every 8 (eight) hours as needed for nausea or vomiting. 20 tablet 0  . ondansetron (ZOFRAN) 4 MG tablet Take 4 mg every 8 (eight) hours as needed by mouth for nausea or vomiting.    Marland Kitchen oxyCODONE (OXY IR/ROXICODONE) 5 MG immediate release tablet Take 1 tablet (5 mg total) by mouth daily as needed for severe pain. 30 tablet 0  . oxyCODONE (ROXICODONE) 5 MG immediate release tablet Take 1-2 tablets (5-10 mg total) by mouth every 4 (four) hours as needed (pain). 45 tablet 0  . pregabalin (LYRICA) 100 MG capsule Take 100 mg 2 (two) times daily by mouth.     . ranitidine (ZANTAC) 150 MG capsule Take 150 mg daily as needed by mouth for heartburn.     . rizatriptan (MAXALT) 5 MG tablet Take 5 mg by mouth as needed for migraine. May repeat in 2 hours if needed    . simvastatin (ZOCOR) 20 MG tablet Take 20 mg by mouth every morning.    . tiotropium (SPIRIVA  HANDIHALER) 18 MCG inhalation capsule Place 18 mcg  into inhaler and inhale every morning.    Marland Kitchen tiZANidine (ZANAFLEX) 4 MG tablet Take 4 mg every 6 (six) hours as needed by mouth for muscle spasms.     . traZODone (DESYREL) 100 MG tablet Take 50 mg by mouth at bedtime as needed for sleep.     No current facility-administered medications for this visit.       Marland Kitchen  PHYSICAL EXAMINATION: ECOG PERFORMANCE STATUS: 0 - Asymptomatic  There were no vitals filed for this visit. There were no vitals filed for this visit.  Physical Exam  Constitutional: She is oriented to person, place, and time and well-developed, well-nourished, and in no distress.  HENT:  Head: Normocephalic and atraumatic.  Mouth/Throat: Oropharynx is clear and moist. No oropharyngeal exudate.  Eyes: Pupils are equal, round, and reactive to light.  Neck: Normal range of motion. Neck supple.  Cardiovascular: Normal rate and regular rhythm.  Pulmonary/Chest: No respiratory distress. She has no wheezes.  Abdominal: Soft. Bowel sounds are normal. She exhibits no distension and no mass. There is no abdominal tenderness. There is no rebound and no guarding.  Musculoskeletal: Normal range of motion.        General: No tenderness or edema.  Neurological: She is alert and oriented to person, place, and time.  Skin: Skin is warm.  Psychiatric: Affect normal.     LABORATORY DATA:  I have reviewed the data as listed Lab Results  Component Value Date   WBC 12.5 (H) 02/19/2019   HGB 13.1 02/19/2019   HCT 40.4 02/19/2019   MCV 90.6 02/19/2019   PLT 419 (H) 02/19/2019   Recent Labs    01/04/19 2004  NA 134*  K 4.3  CL 102  CO2 22  GLUCOSE 223*  BUN 13  CREATININE 0.88  CALCIUM 8.9  GFRNONAA >60  GFRAA >60    RADIOGRAPHIC STUDIES: I have personally reviewed the radiological images as listed and agreed with the findings in the report. No results found.  ASSESSMENT & PLAN:   Lymphocytosis #  Leucocytosis-lymphocytosis/neutrophilia.  Suspect reactive/secondary causes than primary causes.  Will check BCR ABL FISH/peripheral blood flow cytometry; CBC; LDH; also check CRP.  Check review of peripheral smear.  #Myalgias joint pains-suspicious of rheumatologic/inflammation; previously elevated CRP.  If continued elevation recommend consultation with rheumatology.  Thank you Dr.McLaughlin for allowing me to participate in the care of your pleasant patient. Please do not hesitate to contact me with questions or concerns in the interim.  # 45 minutes face-to-face with the patient discussing the above plan of care; more than 50% of time spent on prognosis/ natural history; counseling and coordination.  # DISPOSITION: # labs today # follow up in 2 weeks-MD; no labs-Dr.B    All questions were answered. The patient knows to call the clinic with any problems, questions or concerns.       Cammie Sickle, MD 02/19/2019 12:58 PM

## 2019-02-21 LAB — COMP PANEL: LEUKEMIA/LYMPHOMA

## 2019-02-27 LAB — BCR-ABL1 FISH
Cells Analyzed: 200
Cells Counted: 200

## 2019-03-02 ENCOUNTER — Encounter: Payer: Self-pay | Admitting: Emergency Medicine

## 2019-03-02 ENCOUNTER — Other Ambulatory Visit: Payer: Self-pay

## 2019-03-02 ENCOUNTER — Emergency Department
Admission: EM | Admit: 2019-03-02 | Discharge: 2019-03-02 | Disposition: A | Discharge status: Home / Self Care | Payer: Medicaid Other | Attending: Emergency Medicine | Admitting: Emergency Medicine

## 2019-03-02 DIAGNOSIS — J449 Chronic obstructive pulmonary disease, unspecified: Secondary | ICD-10-CM | POA: Diagnosis not present

## 2019-03-02 DIAGNOSIS — Z87891 Personal history of nicotine dependence: Secondary | ICD-10-CM | POA: Diagnosis not present

## 2019-03-02 DIAGNOSIS — M549 Dorsalgia, unspecified: Secondary | ICD-10-CM | POA: Diagnosis present

## 2019-03-02 DIAGNOSIS — L03312 Cellulitis of back [any part except buttock]: Secondary | ICD-10-CM | POA: Diagnosis not present

## 2019-03-02 DIAGNOSIS — E119 Type 2 diabetes mellitus without complications: Secondary | ICD-10-CM | POA: Diagnosis not present

## 2019-03-02 DIAGNOSIS — L0291 Cutaneous abscess, unspecified: Secondary | ICD-10-CM | POA: Diagnosis not present

## 2019-03-02 DIAGNOSIS — E039 Hypothyroidism, unspecified: Secondary | ICD-10-CM | POA: Diagnosis not present

## 2019-03-02 DIAGNOSIS — Z7982 Long term (current) use of aspirin: Secondary | ICD-10-CM | POA: Diagnosis not present

## 2019-03-02 DIAGNOSIS — Z79899 Other long term (current) drug therapy: Secondary | ICD-10-CM | POA: Insufficient documentation

## 2019-03-02 LAB — CBC WITH DIFFERENTIAL/PLATELET
Abs Immature Granulocytes: 0.05 10*3/uL (ref 0.00–0.07)
Basophils Absolute: 0.1 10*3/uL (ref 0.0–0.1)
Basophils Relative: 0 %
EOS ABS: 0.3 10*3/uL (ref 0.0–0.5)
EOS PCT: 2 %
HCT: 40.4 % (ref 36.0–46.0)
Hemoglobin: 13 g/dL (ref 12.0–15.0)
Immature Granulocytes: 0 %
Lymphocytes Relative: 30 %
Lymphs Abs: 5.2 10*3/uL — ABNORMAL HIGH (ref 0.7–4.0)
MCH: 29.3 pg (ref 26.0–34.0)
MCHC: 32.2 g/dL (ref 30.0–36.0)
MCV: 91.2 fL (ref 80.0–100.0)
Monocytes Absolute: 0.9 10*3/uL (ref 0.1–1.0)
Monocytes Relative: 5 %
Neutro Abs: 10.8 10*3/uL — ABNORMAL HIGH (ref 1.7–7.7)
Neutrophils Relative %: 63 %
Platelets: 401 10*3/uL — ABNORMAL HIGH (ref 150–400)
RBC: 4.43 MIL/uL (ref 3.87–5.11)
RDW: 13.9 % (ref 11.5–15.5)
WBC: 17.4 10*3/uL — ABNORMAL HIGH (ref 4.0–10.5)
nRBC: 0 % (ref 0.0–0.2)

## 2019-03-02 LAB — BASIC METABOLIC PANEL
Anion gap: 9 (ref 5–15)
BUN: 10 mg/dL (ref 6–20)
CO2: 24 mmol/L (ref 22–32)
Calcium: 8.7 mg/dL — ABNORMAL LOW (ref 8.9–10.3)
Chloride: 103 mmol/L (ref 98–111)
Creatinine, Ser: 0.77 mg/dL (ref 0.44–1.00)
GFR calc Af Amer: >60 mL/min (ref >60)
GFR calc non Af Amer: >60 mL/min (ref >60)
Glucose, Bld: 101 mg/dL — ABNORMAL HIGH (ref 70–99)
Potassium: 4.1 mmol/L (ref 3.5–5.1)
Sodium: 136 mmol/L (ref 135–145)

## 2019-03-02 LAB — LACTIC ACID, PLASMA: Lactic Acid, Venous: 1.2 mmol/L (ref 0.5–1.9)

## 2019-03-02 MED ORDER — OXYCODONE-ACETAMINOPHEN 5-325 MG PO TABS: 1.0000 | ORAL_TABLET | ORAL | 0 refills | Status: DC | PRN

## 2019-03-02 MED ORDER — CLINDAMYCIN HCL 300 MG PO CAPS: 300.0000 mg | ORAL_CAPSULE | Freq: Four times a day (QID) | ORAL | 0 refills | Status: AC

## 2019-03-02 MED ORDER — IBUPROFEN 600 MG PO TABS
600.0000 mg | ORAL_TABLET | Freq: Once | ORAL | Status: AC
  Administered 2019-03-02: 600 mg via ORAL
  Filled 2019-03-02: qty 1

## 2019-03-02 MED ORDER — SODIUM CHLORIDE 0.9 % IV SOLN
Freq: Once | INTRAVENOUS | Status: AC
  Administered 2019-03-02: 12:00:00 via INTRAVENOUS

## 2019-03-02 MED ORDER — CLINDAMYCIN PHOSPHATE 600 MG/50ML IV SOLN
600.0000 mg | Freq: Once | INTRAVENOUS | Status: AC
  Administered 2019-03-02: 600 mg via INTRAVENOUS
  Filled 2019-03-02: qty 50

## 2019-03-02 NOTE — Discharge Instructions (Addendum)
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
## 2019-03-02 NOTE — ED Notes (Signed)
Pt c/o chills last night. Denies chills today. States she is usually "hot-natured." Temp WDL today. Pt denies checking temp at home. Pt states she usually runs 97.5 oral. Red spot on back hard, hot, painful to pt. Sharpie used to outline spot.

## 2019-03-02 NOTE — ED Notes (Signed)
Hold on antibiotic until Lactic comes back per verb Monica Terry.

## 2019-03-02 NOTE — ED Triage Notes (Signed)
States sore on back x 3 days, no drainage.

## 2019-03-05 ENCOUNTER — Encounter: Payer: Self-pay | Admitting: Internal Medicine

## 2019-03-05 ENCOUNTER — Other Ambulatory Visit: Payer: Self-pay

## 2019-03-05 ENCOUNTER — Inpatient Hospital Stay: Payer: Medicaid Other | Attending: Internal Medicine | Admitting: Internal Medicine

## 2019-03-05 VITALS — BP 142/80 | HR 80 | Temp 96.9°F | Resp 18 | Wt 240.0 lb

## 2019-03-05 DIAGNOSIS — D72820 Lymphocytosis (symptomatic): Secondary | ICD-10-CM | POA: Insufficient documentation

## 2019-03-05 DIAGNOSIS — L0390 Cellulitis, unspecified: Secondary | ICD-10-CM | POA: Diagnosis not present

## 2019-03-05 DIAGNOSIS — J449 Chronic obstructive pulmonary disease, unspecified: Secondary | ICD-10-CM | POA: Diagnosis not present

## 2019-03-05 DIAGNOSIS — Z87891 Personal history of nicotine dependence: Secondary | ICD-10-CM | POA: Diagnosis not present

## 2019-03-05 NOTE — Assessment & Plan Note (Addendum)
#  Leucocytosis-lymphocytosis/neutrophilia.  Suspect reactive/secondary causes than primary causes.  BCR ABL negative peripheral blood flow cytometry negative.  I would not recommend any further work-up for her mild leukocytosis.  A bone marrow biopsy could be recommended if the counts consistently stay above 25-30,000.  #Right posterior cellulitis- on clindamycin.  Discussed with patient that she needs to be monitored closely/and if it does not improve then recommend I&D.  Defer to PCP for evaluation.  #Myalgias joint pains-slightly elevated CRP chronic elevated white count question rheumatologic issues.  Recommend rheumatology evaluation defer to PCP.  # DISPOSITION: # follow up as needed  Cc; Monica Terry

## 2019-03-05 NOTE — Progress Notes (Signed)
[facility] NOTE  Patient Care Team: Marinda Elk, MD as PCP - General (Physician Assistant)  CHIEF COMPLAINTS/PURPOSE OF CONSULTATION: Leucocytosis  # LEUCOCYTOSIS- Mil Nuetrophilia/ lymphocytosis-BCR ABL negative/flow cytometry negative; CRP elevated 1.7.  #  alpha 1 tryspsin def -COPD; Hx of viral/ bacterial meningitis; Hep C s/p treatment [Dr.Wohl]; "cure"  No history exists.     HISTORY OF PRESENTING ILLNESS:  Monica Terry 53 y.o.  female is here for follow-up to review her results of the work-up that was ordered for leukocytosis last visit.  Patient states that she was recently evaluated in emergency room for cellulitis-patient has been prescribed clindamycin.  Patient continues to chronic joint pains myalgias.  Review of Systems  Constitutional: Positive for malaise/fatigue. Negative for chills, diaphoresis and fever.  HENT: Negative for nosebleeds and sore throat.   Eyes: Negative for double vision.  Respiratory: Negative for cough, hemoptysis, sputum production, shortness of breath and wheezing.   Cardiovascular: Negative for chest pain, palpitations, orthopnea and leg swelling.  Gastrointestinal: Negative for abdominal pain, blood in stool, constipation, diarrhea, heartburn, melena, nausea and vomiting.  Genitourinary: Negative for dysuria, frequency and urgency.  Musculoskeletal: Positive for back pain, joint pain and myalgias.  Skin: Negative.  Negative for itching and rash.  Neurological: Negative for dizziness, tingling, focal weakness, weakness and headaches.  Endo/Heme/Allergies: Does not bruise/bleed easily.  Psychiatric/Behavioral: Negative for depression. The patient is not nervous/anxious and does not have insomnia.      MEDICAL HISTORY:  Past Medical History:  Diagnosis Date  . Abnormal uterine bleeding   . Anemia   . Anxiety   . Arthritis    NECK  . Bell's palsy    right side  . Bipolar 1 disorder ([place])   . Brittle  bones   . COPD (chronic obstructive pulmonary disease) ([place])   . Depression   . Diabetes mellitus without complication ([place])   . GERD (gastroesophageal reflux disease)   . Heart murmur   . Hepatitis 2016   c: treated by Dr. Allen Norris ([place])  . Hypercholesteremia   . Hypothyroidism   . Meningitis 01/20/2017  . Migraine headache    after meningitis.  none since starting nortriptyline  . Neck pain   . Neck pain, chronic   . Panic attack   . Sciatica   . Shortness of breath dyspnea   . Thyroid disease   . Wears dentures    full upper    SURGICAL HISTORY: Past Surgical History:  Procedure Laterality Date  . ABDOMINAL HYSTERECTOMY    . CARPAL TUNNEL RELEASE Right 11/22/2017   Procedure: CARPAL TUNNEL RELEASE;  Surgeon: Leanor Kail, MD;  Location: [facility];  Service: Orthopedics;  Laterality: Right;  . COLONOSCOPY WITH PROPOFOL N/A 03/27/2018   Procedure: COLONOSCOPY WITH PROPOFOL;  Surgeon: Toledo, Benay Pike, MD;  Location: [facility];  Service: Gastroenterology;  Laterality: N/A;  . ESOPHAGOGASTRODUODENOSCOPY (EGD) WITH PROPOFOL N/A 03/27/2018   Procedure: ESOPHAGOGASTRODUODENOSCOPY (EGD) WITH PROPOFOL;  Surgeon: Toledo, Benay Pike, MD;  Location: [facility];  Service: Gastroenterology;  Laterality: N/A;  . HYSTEROSCOPY W/D&C N/A 02/08/2016   Procedure: DILATATION AND CURETTAGE /HYSTEROSCOPY;  Surgeon: Brayton Mars, MD;  Location: [facility];  Service: Gynecology;  Laterality: N/A;  . NECK SURGERY  2009   failed fusion  . SALPINGOOPHORECTOMY Bilateral 04/11/2016   Procedure: SALPINGO OOPHORECTOMY;  Surgeon: Brayton Mars, MD;  Location: [facility];  Service: Gynecology;  Laterality: Bilateral;  . SHOULDER ARTHROSCOPY Right 01/04/2019   Procedure: ARTHROSCOPY  SHOULDER WITH ARTHROSCOPIC  DISTAL CLAVICLE EXCISION, SUBACROMIAL AND DECOMPRESSION BICEPS TENOTOMY;  Surgeon: Leim Fabry, MD;  Location: [facility];  Service: Orthopedics;  Laterality: Right;  TODD  MUNDY ASSISTING ARTHROCARE WAND FLOW 90 WAND BARREL [place] CHAIR WITH SPYDER SLING AFTER SURGERY Diabetic - injectible  . spinal meningitis  12/2016  . spleenectomy    . TUBAL LIGATION    . VAGINAL HYSTERECTOMY Bilateral 04/11/2016   Procedure: HYSTERECTOMY VAGINAL;  Surgeon: Brayton Mars, MD;  Location: [facility];  Service: Gynecology;  Laterality: Bilateral;    SOCIAL HISTORY: Social History   Socioeconomic History  . Marital status: Single    Spouse name: Not on file  . Number of children: Not on file  . Years of education: Not on file  . Highest education level: Not on file  Occupational History  . Not on file  Social Needs  . Financial resource strain: Not on file  . Food insecurity:    Worry: Not on file    Inability: Not on file  . Transportation needs:    Medical: Not on file    Non-medical: Not on file  Tobacco Use  . Smoking status: Former Smoker    Packs/day: 0.10    Years: 41.00    Pack years: 4.10    Types: Cigarettes    Start date: 02/08/1974    Last attempt to quit: 01/26/2018    Years since quitting: 1.1  . Smokeless tobacco: Never Used  Substance and Sexual Activity  . Alcohol use: Yes    Comment: occas (1-2x/yr)  . Drug use: No    Comment: CPD oil  . Sexual activity: Not Currently    Birth control/protection: Surgical  Lifestyle  . Physical activity:    Days per week: Not on file    Minutes per session: Not on file  . Stress: Not on file  Relationships  . Social connections:    Talks on phone: Not on file    Gets together: Not on file    Attends religious service: Not on file    Active member of club or organization: Not on file    Attends meetings of clubs or organizations: Not on file    Relationship status: Not on file  . Intimate partner violence:    Fear of current or ex partner: Not on file    Emotionally abused: Not on file    Physically abused: Not on file    Forced sexual activity: Not on file  Other Topics Concern    . 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     FAMILY HISTORY: Family History  Problem Relation Age of Onset  . Diabetes Mother   . Heart disease Mother   . Cervical cancer Sister   . Diabetes Maternal Grandmother   . Colon cancer Maternal Grandfather   . Breast cancer Neg Hx   . Ovarian cancer Neg Hx     ALLERGIES:  has No Known Allergies.  MEDICATIONS:  Current Outpatient Medications  Medication Sig Dispense Refill  . acetaminophen (TYLENOL) 500 MG tablet Take 2 tablets (1,000 mg total) by mouth every 8 (eight) hours. 90 tablet 2  . acyclovir ointment (ZOVIRAX) 5 % Apply 1 application topically 5 (five) times daily as needed.    Marland Kitchen albuterol (ACCUNEB) 1.25 MG/3ML nebulizer solution Take 1 ampule by nebulization every 6 (six) hours as needed for wheezing.    Marland Kitchen  albuterol (PROVENTIL HFA;VENTOLIN HFA) 108 (90 Base) MCG/ACT inhaler Inhale into the lungs every 6 (six) hours as needed for wheezing or shortness of breath.    Marland Kitchen alendronate (FOSAMAX) 70 MG tablet Take 70 mg every Tuesday by mouth. Take with a full glass of water on an empty stomach.     Marland Kitchen artificial tears (LACRILUBE) OINT ophthalmic ointment Place into the right eye at bedtime as needed for dry eyes. 1 Tube 0  . aspirin EC 81 MG tablet Take 81 mg by mouth daily.    Marland Kitchen buPROPion (WELLBUTRIN XL) 150 MG 24 hr tablet Take 150 mg daily by mouth.    . cetirizine (ZYRTEC) 10 MG tablet Take 10 mg by mouth daily.     . clindamycin (CLEOCIN) 300 MG capsule Take 1 capsule (300 mg total) by mouth 4 (four) times daily for 10 days. 40 capsule 0  . docusate sodium (COLACE) 100 MG capsule TAKE 1 CAPSULE BY MOUTH TWICE DAILY (Patient taking differently: TAKE 100 MG BY MOUTH TWICE DAILY) 30 capsule 1  . estradiol (ESTRACE) 1 MG tablet Take 1 tablet (1 mg total) by mouth daily. 30 tablet 12  . Fluticasone-Salmeterol (ADVAIR DISKUS) 250-50 MCG/DOSE  AEPB Inhale 2 puffs 2 (two) times daily into the lungs.     . gabapentin (NEURONTIN) 100 MG capsule Take 100 mg 2 (two) times daily as needed by mouth (for pain).     Marland Kitchen levothyroxine (SYNTHROID, LEVOTHROID) 175 MCG tablet Take 175 mcg daily before breakfast by mouth.     . lidocaine (XYLOCAINE) 5 % ointment Apply 1 application 2 (two) times daily as needed topically for moderate pain.     Marland Kitchen linaclotide (LINZESS) 145 MCG CAPS capsule Take 145 mcg by mouth daily.    Marland Kitchen liraglutide (VICTOZA) 18 MG/3ML SOPN Inject 0.6 mg into the skin 1 day or 1 dose.    . meloxicam (MOBIC) 15 MG tablet Take 15 mg daily by mouth.    . montelukast (SINGULAIR) 10 MG tablet Take 10 mg at bedtime by mouth.    . NON FORMULARY Apply 1 g 2 (two) times daily topically. Baclofen5%/Diclofenac3%/Gabapentin10% 120 gm    . nortriptyline (PAMELOR) 10 MG capsule Take 10 mg by mouth at bedtime.    . ondansetron (ZOFRAN ODT) 4 MG disintegrating tablet Take 1 tablet (4 mg total) by mouth every 8 (eight) hours as needed for nausea or vomiting. 20 tablet 0  . ondansetron (ZOFRAN) 4 MG tablet Take 4 mg every 8 (eight) hours as needed by mouth for nausea or vomiting.    Marland Kitchen oxyCODONE (ROXICODONE) 5 MG immediate release tablet Take 1-2 tablets (5-10 mg total) by mouth every 4 (four) hours as needed (pain). 45 tablet 0  . oxyCODONE-acetaminophen (PERCOCET) 5-325 MG tablet Take 1 tablet by mouth every 4 (four) hours as needed. 12 tablet 0  . pregabalin (LYRICA) 100 MG capsule Take 100 mg 2 (two) times daily by mouth.     . ranitidine (ZANTAC) 150 MG capsule Take 150 mg daily as needed by mouth for heartburn.     . rizatriptan (MAXALT) 5 MG tablet Take 5 mg by mouth as needed for migraine. May repeat in 2 hours if needed    . simvastatin (ZOCOR) 20 MG tablet Take 20 mg by mouth every morning.    . tiotropium (SPIRIVA HANDIHALER) 18 MCG inhalation capsule Place 18 mcg into inhaler and inhale every morning.    Marland Kitchen tiZANidine (ZANAFLEX) 4 MG tablet  Take 4 mg every  6 (six) hours as needed by mouth for muscle spasms.     . traZODone (DESYREL) 100 MG tablet Take 50 mg by mouth at bedtime as needed for sleep.    . fluticasone (FLONASE) 50 MCG/ACT nasal spray Place 1 spray into both nostrils daily. (Patient taking differently: Place 2 sprays daily as needed into both nostrils for allergies. ) 1 g 0  . oxyCODONE (OXY IR/ROXICODONE) 5 MG immediate release tablet Take 1 tablet (5 mg total) by mouth daily as needed for severe pain. 30 tablet 0   No current facility-administered medications for this visit.       Marland Kitchen  PHYSICAL EXAMINATION: ECOG PERFORMANCE STATUS: 0 - Asymptomatic  Vitals:   03/05/19 1330  BP: (!) 142/80  Pulse: 80  Resp: 18  Temp: (!) 96.9 F (36.1 C)   Filed Weights   03/05/19 1330  Weight: 240 lb (108.9 kg)    Physical Exam  Constitutional: She is oriented to person, place, and time and well-developed, well-nourished, and in no distress.  HENT:  Head: Normocephalic and atraumatic.  Mouth/Throat: Oropharynx is clear and moist. No oropharyngeal exudate.  Eyes: Pupils are equal, round, and reactive to light.  Neck: Normal range of motion. Neck supple.  Cardiovascular: Normal rate and regular rhythm.  Pulmonary/Chest: No respiratory distress. She has no wheezes.  Abdominal: Soft. Bowel sounds are normal. She exhibits no distension and no mass. There is no abdominal tenderness. There is no rebound and no guarding.  Musculoskeletal: Normal range of motion.        General: No tenderness or edema.  Neurological: She is alert and oriented to person, place, and time.  Skin: Skin is warm.  4 to 5 cm raised lesion noted in the posterior back no fluctuation noted.  Warm to touch/erythema mildly tender.  Borders seem to be regressing.  Psychiatric: Affect normal.       LABORATORY DATA:  I have reviewed the data as listed Lab Results  Component Value Date   WBC 17.4 (H) 03/02/2019   HGB 13.0 03/02/2019   HCT 40.4  03/02/2019   MCV 91.2 03/02/2019   PLT 401 (H) 03/02/2019   Recent Labs    01/04/19 2004 03/02/19 1140  NA 134* 136  K 4.3 4.1  CL 102 103  CO2 22 24  GLUCOSE 223* 101*  BUN 13 10  CREATININE 0.88 0.77  CALCIUM 8.9 8.7*  GFRNONAA >60 >60  GFRAA >60 >60    RADIOGRAPHIC STUDIES: I have personally reviewed the radiological images as listed and agreed with the findings in the report. No results found.  ASSESSMENT & PLAN:   Lymphocytosis # Leucocytosis-lymphocytosis/neutrophilia.  Suspect reactive/secondary causes than primary causes.  BCR ABL negative peripheral blood flow cytometry negative.  I would not recommend any further work-up for her mild leukocytosis.  A bone marrow biopsy could be recommended if the counts consistently stay above 25-30,000.  #Right posterior cellulitis- on clindamycin.  Discussed with patient that she needs to be monitored closely/and if it does not improve then recommend I&D.  Defer to PCP for evaluation.  #Myalgias joint pains-slightly elevated CRP chronic elevated white count question rheumatologic issues.  Recommend rheumatology evaluation defer to PCP.  # DISPOSITION: # follow up as needed  Cc; Mimi McLaughlin    All questions were answered. The patient knows to call the clinic with any problems, questions or concerns.       Cammie Sickle, MD 03/05/2019 8:29 PM

## 2019-06-20 ENCOUNTER — Encounter: Payer: Self-pay | Admitting: Internal Medicine

## 2019-06-20 ENCOUNTER — Inpatient Hospital Stay: Payer: Medicaid Other | Attending: Internal Medicine | Admitting: Internal Medicine

## 2019-06-20 ENCOUNTER — Other Ambulatory Visit: Payer: Self-pay

## 2019-06-20 DIAGNOSIS — D72820 Lymphocytosis (symptomatic): Secondary | ICD-10-CM | POA: Diagnosis not present

## 2019-06-20 NOTE — Progress Notes (Signed)
I connected with Monica Terry on 06/20/19 at  9:30 AM EDT by video enabled telemedicine visit and verified that I am speaking with the correct person using two identifiers.  I discussed the limitations, risks, security and privacy concerns of performing an evaluation and management service by telemedicine and the availability of in-person appointments. I also discussed with the patient that there may be a patient responsible charge related to this service. The patient expressed understanding and agreed to proceed.    Other persons participating in the visit and their role in the encounter: RN medical reconciliation Patient's location: Home Provider's location: Home  Oncology History   No history exists.     Chief Complaint: Leukocytosis   History of present illness:Monica Terry 52 y.o.  female with history of chronic intermittent leukocytosis leukocytosis neutrophilia has been referred to [place] for further evaluation and recommendations.  Patient denies any new onset of weight loss but denies any fevers or chills.  No night sweats.  Appetite is good.   Observation/objective: June 2020 white count 14/mild lymphocytosis/neutrophilia; normal hemoglobin platelets.  Assessment and plan: Lymphocytosis # Leucocytosis-lymphocytosis/neutrophilia.  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  March 2020/ BCR ABL negative peripheral blood flow cytometry negative.   #Clinically I do not think a bone marrow biopsy would be a very helpful test/especially as her counts are not steadily increasing.  Recommend monitoring.  #Myalgias joint pains-slightly elevated CRP chronic elevated white count question rheumatologic issues. Stable.   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  The patient was provided an opportunity to ask questions and all were answered.  The patient agreed with the plan and demonstrated understanding of instructions.  The patient was advised to call back or seek an in person evaluation if the symptoms worsen or if the condition fails to improve as anticipated.  Dr. Charlaine Dalton [place] at [facility] 06/20/2019 9:52 AM

## 2019-06-20 NOTE — Assessment & Plan Note (Addendum)
#  Leucocytosis-lymphocytosis/neutrophilia.  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  March 2020/ BCR ABL negative peripheral blood flow cytometry negative.   #Clinically I do not think a bone marrow biopsy would be a very helpful test/especially as her counts are not steadily increasing.  Recommend monitoring.  #Myalgias joint pains-slightly elevated CRP chronic elevated white count question rheumatologic issues. Stable.   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

## 2019-08-29 ENCOUNTER — Other Ambulatory Visit: Payer: Self-pay

## 2019-08-29 ENCOUNTER — Emergency Department: Payer: Medicaid Other

## 2019-08-29 ENCOUNTER — Encounter: Payer: Self-pay | Admitting: Emergency Medicine

## 2019-08-29 ENCOUNTER — Emergency Department
Admission: EM | Admit: 2019-08-29 | Discharge: 2019-08-29 | Disposition: A | Discharge status: Home / Self Care | Payer: Medicaid Other | Attending: Emergency Medicine | Admitting: Emergency Medicine

## 2019-08-29 DIAGNOSIS — E039 Hypothyroidism, unspecified: Secondary | ICD-10-CM | POA: Diagnosis not present

## 2019-08-29 DIAGNOSIS — W19XXXA Unspecified fall, initial encounter: Secondary | ICD-10-CM

## 2019-08-29 DIAGNOSIS — Z79899 Other long term (current) drug therapy: Secondary | ICD-10-CM | POA: Insufficient documentation

## 2019-08-29 DIAGNOSIS — I10 Essential (primary) hypertension: Secondary | ICD-10-CM | POA: Insufficient documentation

## 2019-08-29 DIAGNOSIS — Z7982 Long term (current) use of aspirin: Secondary | ICD-10-CM | POA: Diagnosis not present

## 2019-08-29 DIAGNOSIS — Z87891 Personal history of nicotine dependence: Secondary | ICD-10-CM | POA: Diagnosis not present

## 2019-08-29 DIAGNOSIS — M25571 Pain in right ankle and joints of right foot: Secondary | ICD-10-CM | POA: Diagnosis not present

## 2019-08-29 DIAGNOSIS — E119 Type 2 diabetes mellitus without complications: Secondary | ICD-10-CM | POA: Insufficient documentation

## 2019-08-29 DIAGNOSIS — M79642 Pain in left hand: Secondary | ICD-10-CM | POA: Insufficient documentation

## 2019-08-29 NOTE — ED Triage Notes (Signed)
PT had mechanical fall down steps at home and c/o RT foot pain. + swelling. Denies any head injury . VSS

## 2019-08-29 NOTE — Discharge Instructions (Signed)
You have been diagnosed with a right foot and left hand contusion. Please take meloxicam that is already been prescribed for you daily for inflammation and pain. You can apply ice daily to help with inflammation and pain.

## 2019-08-29 NOTE — ED Notes (Signed)
See triage note  Presents with pain and swelling to right foot  States she caught her foot  Positive swelling noted with some bruising  Good pulses
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
## 2019-09-19 ENCOUNTER — Other Ambulatory Visit: Payer: Self-pay | Admitting: Podiatry

## 2019-09-19 ENCOUNTER — Ambulatory Visit (INDEPENDENT_AMBULATORY_CARE_PROVIDER_SITE_OTHER): Payer: Medicaid Other

## 2019-09-19 ENCOUNTER — Other Ambulatory Visit: Payer: Self-pay

## 2019-09-19 ENCOUNTER — Ambulatory Visit: Payer: Medicaid Other | Admitting: Podiatry

## 2019-09-19 ENCOUNTER — Encounter: Payer: Self-pay | Admitting: Podiatry

## 2019-09-19 DIAGNOSIS — M779 Enthesopathy, unspecified: Secondary | ICD-10-CM

## 2019-09-19 DIAGNOSIS — M2580 Other specified joint disorders, unspecified joint: Secondary | ICD-10-CM

## 2019-09-19 DIAGNOSIS — M216X2 Other acquired deformities of left foot: Secondary | ICD-10-CM

## 2019-09-19 DIAGNOSIS — R52 Pain, unspecified: Secondary | ICD-10-CM

## 2019-09-19 DIAGNOSIS — M7752 Other enthesopathy of left foot: Secondary | ICD-10-CM

## 2019-09-19 NOTE — Progress Notes (Signed)
This patient presents the office with chief complaint of pain in her big toe joint left foot.  She says that she fell down stairs approximately 2 weeks ago.  She said her right foot which was painful has healed and is pain-free.  She says that her left foot continues to be painful under the big toe joint and she says it feels like a bone has turned into an egg in her left big toe joint area.  She says this pain has been present for approximately 2 weeks.  She has provided no self treatment or sought any professional help.  She presents the office today for an evaluation and treatment of her left foot.  She does relate that she has numbness and tingling noted in all of her toes on her left foot for the last few weeks also.  Vascular  Dorsalis pedis and posterior tibial pulses are palpable  B/L.  Capillary return  WNL.  Temperature gradient is  WNL.  Skin turgor  WNL  Sensorium  Senn Weinstein monofilament wire  WNL. Normal tactile sensation.  Nail Exam  Patient has normal nails with no evidence of bacterial or fungal infection.  Orthopedic  Exam  Muscle tone and muscle strength  WNL.  No limitations of motion feet  B/L.  No crepitus or joint effusion noted.  Foot type is unremarkable and digits show no abnormalities.  Bony prominences are unremarkable.  Palpable pain noted to the fibular sesamoid left foot.  No evidence of any redness or swelling noted at this site.  No pain noted upon range of motion of the first MPJ of the left foot.  Skin  No open lesions.  Normal skin texture and turgor.  Sesamoiditis left foot  Capsulitis 1st MPJ  Left foot.  IE.  X-rays were taken.  And no evidence of any bony pathology to the fibular sesamoid was noted.  Discussed this condition with this patient.  Told this patient that she may have a hairline fracture or a ligament sprain noted at the site of the fibular sesamoid.  Since there was no redness swelling or pain on range of motion I provided her with padding in an  effort to off weight-bear that area during gait.  RTC prn.   Gardiner Barefoot DPM

## 2019-12-12 ENCOUNTER — Encounter: Payer: Self-pay | Admitting: Internal Medicine

## 2019-12-12 ENCOUNTER — Other Ambulatory Visit: Payer: Self-pay

## 2019-12-12 ENCOUNTER — Other Ambulatory Visit: Payer: Self-pay | Admitting: *Deleted

## 2019-12-12 DIAGNOSIS — D72820 Lymphocytosis (symptomatic): Secondary | ICD-10-CM

## 2019-12-13 ENCOUNTER — Other Ambulatory Visit: Payer: Self-pay

## 2019-12-13 ENCOUNTER — Inpatient Hospital Stay (HOSPITAL_BASED_OUTPATIENT_CLINIC_OR_DEPARTMENT_OTHER): Payer: Medicaid Other | Admitting: Internal Medicine

## 2019-12-13 ENCOUNTER — Inpatient Hospital Stay: Payer: Medicaid Other | Attending: Internal Medicine

## 2019-12-13 VITALS — BP 147/80 | HR 79 | Temp 97.1°F | Resp 18 | Wt 214.0 lb

## 2019-12-13 DIAGNOSIS — F319 Bipolar disorder, unspecified: Secondary | ICD-10-CM | POA: Insufficient documentation

## 2019-12-13 DIAGNOSIS — Z791 Long term (current) use of non-steroidal anti-inflammatories (NSAID): Secondary | ICD-10-CM | POA: Insufficient documentation

## 2019-12-13 DIAGNOSIS — D72820 Lymphocytosis (symptomatic): Secondary | ICD-10-CM | POA: Insufficient documentation

## 2019-12-13 DIAGNOSIS — E119 Type 2 diabetes mellitus without complications: Secondary | ICD-10-CM | POA: Insufficient documentation

## 2019-12-13 DIAGNOSIS — J449 Chronic obstructive pulmonary disease, unspecified: Secondary | ICD-10-CM | POA: Insufficient documentation

## 2019-12-13 DIAGNOSIS — Z79899 Other long term (current) drug therapy: Secondary | ICD-10-CM | POA: Insufficient documentation

## 2019-12-13 DIAGNOSIS — F419 Anxiety disorder, unspecified: Secondary | ICD-10-CM | POA: Diagnosis not present

## 2019-12-13 DIAGNOSIS — Z87891 Personal history of nicotine dependence: Secondary | ICD-10-CM | POA: Insufficient documentation

## 2019-12-13 DIAGNOSIS — Z794 Long term (current) use of insulin: Secondary | ICD-10-CM | POA: Diagnosis not present

## 2019-12-13 DIAGNOSIS — Z8249 Family history of ischemic heart disease and other diseases of the circulatory system: Secondary | ICD-10-CM | POA: Insufficient documentation

## 2019-12-13 DIAGNOSIS — Z1231 Encounter for screening mammogram for malignant neoplasm of breast: Secondary | ICD-10-CM

## 2019-12-13 DIAGNOSIS — E7800 Pure hypercholesterolemia, unspecified: Secondary | ICD-10-CM | POA: Insufficient documentation

## 2019-12-13 DIAGNOSIS — K219 Gastro-esophageal reflux disease without esophagitis: Secondary | ICD-10-CM | POA: Diagnosis not present

## 2019-12-13 DIAGNOSIS — Z7951 Long term (current) use of inhaled steroids: Secondary | ICD-10-CM | POA: Diagnosis not present

## 2019-12-13 DIAGNOSIS — Z9081 Acquired absence of spleen: Secondary | ICD-10-CM | POA: Insufficient documentation

## 2019-12-13 DIAGNOSIS — R5383 Other fatigue: Secondary | ICD-10-CM | POA: Diagnosis not present

## 2019-12-13 DIAGNOSIS — Z833 Family history of diabetes mellitus: Secondary | ICD-10-CM | POA: Insufficient documentation

## 2019-12-13 DIAGNOSIS — E039 Hypothyroidism, unspecified: Secondary | ICD-10-CM | POA: Insufficient documentation

## 2019-12-13 LAB — COMPREHENSIVE METABOLIC PANEL
ALT: 18 U/L (ref 0–44)
AST: 24 U/L (ref 15–41)
Albumin: 3.6 g/dL (ref 3.5–5.0)
Alkaline Phosphatase: 119 U/L (ref 38–126)
Anion gap: 9 (ref 5–15)
BUN: 18 mg/dL (ref 6–20)
CO2: 22 mmol/L (ref 22–32)
Calcium: 8.8 mg/dL — ABNORMAL LOW (ref 8.9–10.3)
Chloride: 104 mmol/L (ref 98–111)
Creatinine, Ser: 0.66 mg/dL (ref 0.44–1.00)
GFR calc Af Amer: >60 mL/min (ref >60)
GFR calc non Af Amer: >60 mL/min (ref >60)
Glucose, Bld: 168 mg/dL — ABNORMAL HIGH (ref 70–99)
Potassium: 4 mmol/L (ref 3.5–5.1)
Sodium: 135 mmol/L (ref 135–145)
Total Bilirubin: 0.4 mg/dL (ref 0.3–1.2)
Total Protein: 6.9 g/dL (ref 6.5–8.1)

## 2019-12-13 LAB — CBC WITH DIFFERENTIAL/PLATELET
Abs Immature Granulocytes: 0 10*3/uL (ref 0.00–0.07)
Basophils Absolute: 0 10*3/uL (ref 0.0–0.1)
Basophils Relative: 0 %
Eosinophils Absolute: 0.8 10*3/uL — ABNORMAL HIGH (ref 0.0–0.5)
Eosinophils Relative: 6 %
HCT: 39.8 % (ref 36.0–46.0)
Hemoglobin: 12.6 g/dL (ref 12.0–15.0)
Lymphocytes Relative: 26 %
Lymphs Abs: 3.6 10*3/uL (ref 0.7–4.0)
MCH: 29.2 pg (ref 26.0–34.0)
MCHC: 31.7 g/dL (ref 30.0–36.0)
MCV: 92.1 fL (ref 80.0–100.0)
Monocytes Absolute: 0.4 10*3/uL (ref 0.1–1.0)
Monocytes Relative: 3 %
Neutro Abs: 9.1 10*3/uL — ABNORMAL HIGH (ref 1.7–7.7)
Neutrophils Relative %: 65 %
Platelets: 381 10*3/uL (ref 150–400)
RBC: 4.32 MIL/uL (ref 3.87–5.11)
RDW: 13.5 % (ref 11.5–15.5)
Smear Review: NORMAL
WBC: 14 10*3/uL — ABNORMAL HIGH (ref 4.0–10.5)
nRBC: 0 % (ref 0.0–0.2)

## 2019-12-13 LAB — LACTATE DEHYDROGENASE: LDH: 165 U/L (ref 98–192)

## 2019-12-13 NOTE — Progress Notes (Signed)
[facility] NOTE  Patient Care Team: Marinda Elk, MD as PCP - General (Physician Assistant)  CHIEF COMPLAINTS/PURPOSE OF CONSULTATION: Leucocytosis  # LEUCOCYTOSIS- Mil Nuetrophilia/ lymphocytosis-SECONDARY to SPLENECTOMY;[BCR ABL negative/flow cytometry negative; CRP elevated 1.7].   #  alpha 1 tryspsin def -COPD; Hx of viral/ bacterial meningitis; Hep C s/p treatment [Dr.Wohl]; "cure" Oncology History   No history exists.     HISTORY OF PRESENTING ILLNESS:  Monica Terry 53 y.o.  female is here for follow-up for history of leukocytosis.  Patient complains of a small bump in between her breasts that she noted about 2 weeks ago.  She also complains of fullness in the right underarm.  Denies any masses in the breast.  Review of Systems  Constitutional: Positive for malaise/fatigue. Negative for chills, diaphoresis and fever.  HENT: Negative for nosebleeds and sore throat.   Eyes: Negative for double vision.  Respiratory: Negative for cough, hemoptysis, sputum production, shortness of breath and wheezing.   Cardiovascular: Negative for chest pain, palpitations, orthopnea and leg swelling.  Gastrointestinal: Negative for abdominal pain, blood in stool, constipation, diarrhea, heartburn, melena, nausea and vomiting.  Genitourinary: Negative for dysuria, frequency and urgency.  Musculoskeletal: Positive for back pain, joint pain and myalgias.  Skin: Negative.  Negative for itching and rash.  Neurological: Negative for dizziness, tingling, focal weakness, weakness and headaches.  Endo/Heme/Allergies: Does not bruise/bleed easily.  Psychiatric/Behavioral: Negative for depression. The patient is not nervous/anxious and does not have insomnia.      MEDICAL HISTORY:  Past Medical History:  Diagnosis Date  . Abnormal uterine bleeding   . Anemia   . Anxiety   . Arthritis    NECK  . Bell's palsy    right side  . Bipolar 1 disorder ([place])   . Brittle  bones   . COPD (chronic obstructive pulmonary disease) ([place])   . Depression   . Diabetes mellitus without complication ([place])   . GERD (gastroesophageal reflux disease)   . Heart murmur   . Hepatitis 2016   c: treated by Dr. Allen Norris ([facility])  . Hypercholesteremia   . Hypothyroidism   . Meningitis 01/20/2017  . Migraine headache    after meningitis.  none since starting nortriptyline  . Neck pain   . Neck pain, chronic   . Panic attack   . Sciatica   . Shortness of breath dyspnea   . Thyroid disease   . Wears dentures    full upper    SURGICAL HISTORY: Past Surgical History:  Procedure Laterality Date  . ABDOMINAL HYSTERECTOMY    . CARPAL TUNNEL RELEASE Right 11/22/2017   Procedure: CARPAL TUNNEL RELEASE;  Surgeon: Leanor Kail, MD;  Location: [facility];  Service: Orthopedics;  Laterality: Right;  . COLONOSCOPY WITH PROPOFOL N/A 03/27/2018   Procedure: COLONOSCOPY WITH PROPOFOL;  Surgeon: Toledo, Benay Pike, MD;  Location: [facility];  Service: Gastroenterology;  Laterality: N/A;  . ESOPHAGOGASTRODUODENOSCOPY (EGD) WITH PROPOFOL N/A 03/27/2018   Procedure: ESOPHAGOGASTRODUODENOSCOPY (EGD) WITH PROPOFOL;  Surgeon: Toledo, Benay Pike, MD;  Location: [facility];  Service: Gastroenterology;  Laterality: N/A;  . HYSTEROSCOPY WITH D & C N/A 02/08/2016   Procedure: DILATATION AND CURETTAGE /HYSTEROSCOPY;  Surgeon: Brayton Mars, MD;  Location: [facility];  Service: Gynecology;  Laterality: N/A;  . NECK SURGERY  2009   failed fusion  . SALPINGOOPHORECTOMY Bilateral 04/11/2016   Procedure: SALPINGO OOPHORECTOMY;  Surgeon: Brayton Mars, MD;  Location: [facility];  Service: Gynecology;  Laterality: Bilateral;  .  SHOULDER ARTHROSCOPY Right 01/04/2019   Procedure: ARTHROSCOPY SHOULDER WITH ARTHROSCOPIC  DISTAL CLAVICLE EXCISION, SUBACROMIAL AND DECOMPRESSION BICEPS TENOTOMY;  Surgeon: Leim Fabry, MD;  Location: [facility];  Service: Orthopedics;  Laterality: Right;   TODD MUNDY ASSISTING ARTHROCARE WAND FLOW 90 WAND BARREL [place] CHAIR WITH SPYDER SLING AFTER SURGERY Diabetic - injectible  . spinal meningitis  12/2016  . spleenectomy    . TUBAL LIGATION    . VAGINAL HYSTERECTOMY Bilateral 04/11/2016   Procedure: HYSTERECTOMY VAGINAL;  Surgeon: Brayton Mars, MD;  Location: [facility];  Service: Gynecology;  Laterality: Bilateral;    SOCIAL HISTORY: Social History   Socioeconomic History  . Marital status: Single    Spouse name: Not on file  . Number of children: Not on file  . Years of education: Not on file  . Highest education level: Not on file  Occupational History  . Not on file  Tobacco Use  . Smoking status: Former Smoker    Packs/day: 0.10    Years: 41.00    Pack years: 4.10    Types: Cigarettes    Start date: 02/08/1974    Quit date: 01/26/2018    Years since quitting: 1.8  . Smokeless tobacco: Never Used  Substance and Sexual Activity  . Alcohol use: Yes    Comment: occas (1-2x/yr)  . Drug use: No    Comment: CPD oil  . Sexual activity: Not Currently    Birth control/protection: Surgical  Other Topics Concern  . 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    Social Determinants of Health   Financial Resource Strain:   . Difficulty of Paying Living Expenses: Not on file  Food Insecurity:   . Worried About Charity fundraiser in the Last Year: Not on file  . Ran Out of Food in the Last Year: Not on file  Transportation Needs:   . Lack of Transportation (Medical): Not on file  . Lack of Transportation (Non-Medical): Not on file  Physical Activity:   . Days of Exercise per Week: Not on file  . Minutes of Exercise per Session: Not on file  Stress:   . Feeling of Stress : Not on file  Social Connections:   . Frequency of Communication with Friends and Family: Not on file  . Frequency of Social Gatherings with Friends  and Family: Not on file  . Attends Religious Services: Not on file  . Active Member of Clubs or Organizations: Not on file  . Attends Archivist Meetings: Not on file  . Marital Status: Not on file  Intimate Partner Violence:   . Fear of Current or Ex-Partner: Not on file  . Emotionally Abused: Not on file  . Physically Abused: Not on file  . Sexually Abused: Not on file    FAMILY HISTORY: Family History  Problem Relation Age of Onset  . Diabetes Mother   . Heart disease Mother   . Cervical cancer Sister   . Diabetes Maternal Grandmother   . Colon cancer Maternal Grandfather   . Breast cancer Neg Hx   . Ovarian cancer Neg Hx     ALLERGIES:  has No Known Allergies.  MEDICATIONS:  Current Outpatient Medications  Medication Sig Dispense Refill  . albuterol (PROAIR HFA) 108 (90 Base) MCG/ACT inhaler INHALE 2 PUFFS BY MOUTH EVERY 4 HOURS ASNEEDED    . buPROPion (WELLBUTRIN XL) 150  MG 24 hr tablet Take 150 mg daily by mouth.    . cetirizine (ZYRTEC) 10 MG tablet Take 10 mg by mouth daily.     Marland Kitchen docusate sodium (COLACE) 100 MG capsule TAKE 1 CAPSULE BY MOUTH TWICE DAILY (Patient taking differently: TAKE 100 MG BY MOUTH TWICE DAILY) 30 capsule 1  . estradiol (ESTRACE) 1 MG tablet Take 1 tablet (1 mg total) by mouth daily. 30 tablet 12  . Fluticasone-Salmeterol (ADVAIR DISKUS) 250-50 MCG/DOSE AEPB Inhale 2 puffs 2 (two) times daily into the lungs.     . gabapentin (NEURONTIN) 100 MG capsule Take 100 mg 2 (two) times daily as needed by mouth (for pain).     Marland Kitchen levothyroxine (SYNTHROID) 175 MCG tablet TAKE 1 TAB BY MOUTH ONCE DAILY. TAKE ON AN EMPTY STOMACH WITH A GLASSOF WATER ATLEAST 30-60 MINUTES BEFORE BREAKFAST    . linaclotide (LINZESS) 145 MCG CAPS capsule Take 145 mcg by mouth daily.    Marland Kitchen liraglutide (VICTOZA) 18 MG/3ML SOPN INJECT 0.3 ML (1.8 MG TOTAL) SUBCUTANEOUSLY ONCE DAILY    . meloxicam (MOBIC) 15 MG tablet TAKE 1 TABLET BY MOUTH ONCE DAILY AS NEEDED    .  montelukast (SINGULAIR) 10 MG tablet Take 10 mg at bedtime by mouth.    . nortriptyline (PAMELOR) 10 MG capsule Take 10 mg by mouth at bedtime.    Marland Kitchen oxyCODONE (ROXICODONE) 5 MG immediate release tablet Take 1-2 tablets (5-10 mg total) by mouth every 4 (four) hours as needed (pain). 45 tablet 0  . pregabalin (LYRICA) 150 MG capsule Take 100 mg by mouth 2 (two) times daily.     . ranitidine (ZANTAC) 150 MG capsule Take 150 mg daily as needed by mouth for heartburn.     . simvastatin (ZOCOR) 40 MG tablet Take 40 mg by mouth every morning.     . terbinafine (LAMISIL) 250 MG tablet TAKE 1 TABLET BY MOUTH ONCE DAILY    . tiotropium (SPIRIVA HANDIHALER) 18 MCG inhalation capsule PLACE 1 CAPSULE INTO INHALER AND INHALE ONCE DAILY    . lidocaine (XYLOCAINE) 5 % ointment Apply 1 application 2 (two) times daily as needed topically for moderate pain.     Marland Kitchen ondansetron (ZOFRAN ODT) 4 MG disintegrating tablet Take 1 tablet (4 mg total) by mouth every 8 (eight) hours as needed for nausea or vomiting. (Patient not taking: Reported on 12/12/2019) 20 tablet 0  . ondansetron (ZOFRAN) 4 MG tablet Take 4 mg every 8 (eight) hours as needed by mouth for nausea or vomiting.    Marland Kitchen oxyCODONE (OXY IR/ROXICODONE) 5 MG immediate release tablet Take 1 tablet (5 mg total) by mouth daily as needed for severe pain. 30 tablet 0  . oxyCODONE-acetaminophen (PERCOCET) 5-325 MG tablet Take 1 tablet by mouth every 4 (four) hours as needed. (Patient not taking: Reported on 12/12/2019) 12 tablet 0  . rizatriptan (MAXALT) 5 MG tablet Take 5 mg by mouth as needed for migraine. May repeat in 2 hours if needed    . tiZANidine (ZANAFLEX) 4 MG tablet Take 4 mg every 6 (six) hours as needed by mouth for muscle spasms.      No current facility-administered medications for this visit.      Marland Kitchen  PHYSICAL EXAMINATION: ECOG PERFORMANCE STATUS: 0 - Asymptomatic  Vitals:   12/13/19 0911  BP: (!) 147/80  Pulse: 79  Resp: 18  Temp: (!) 97.1 F  (36.2 C)   Filed Weights   12/13/19 0911  Weight: 214 lb (97.1  kg)    Physical Exam  Constitutional: She is oriented to person, place, and time and well-developed, well-nourished, and in no distress.  HENT:  Head: Normocephalic and atraumatic.  Mouth/Throat: Oropharynx is clear and moist. No oropharyngeal exudate.  Eyes: Pupils are equal, round, and reactive to light.  Cardiovascular: Normal rate and regular rhythm.  Pulmonary/Chest: No respiratory distress. She has no wheezes.  Abdominal: Soft. Bowel sounds are normal. She exhibits no distension and no mass. There is no abdominal tenderness. There is no rebound and no guarding.  Musculoskeletal:        General: No tenderness or edema. Normal range of motion.     Cervical back: Normal range of motion and neck supple.  Neurological: She is alert and oriented to person, place, and time.  Skin: Skin is warm.  Right and left BREAST exam (in the presence of nurse)- no unusual skin changes or dominant masses felt.   Right axilla-mild fullness noted.  Otherwise no masses or lumps felt.  5 x 5 mm -right breast inferior inner quadrant pigmented macular lesion noted.  0.5 x 0.5 cm-raised lesion noted.   Psychiatric: Affect normal.       LABORATORY DATA:  I have reviewed the data as listed Lab Results  Component Value Date   WBC 14.0 (H) 12/13/2019   HGB 12.6 12/13/2019   HCT 39.8 12/13/2019   MCV 92.1 12/13/2019   PLT 381 12/13/2019   Recent Labs    01/04/19 2004 03/02/19 1140 12/13/19 0900  NA 134* 136 135  K 4.3 4.1 4.0  CL 102 103 104  CO2 22 24 22   GLUCOSE 223* 101* 168*  BUN 13 10 18   CREATININE 0.88 0.77 0.66  CALCIUM 8.9 8.7* 8.8*  GFRNONAA >60 >60 >60  GFRAA >60 >60 >60  PROT  --   --  6.9  ALBUMIN  --   --  3.6  AST  --   --  24  ALT  --   --  18  ALKPHOS  --   --  119  BILITOT  --   --  0.4    RADIOGRAPHIC STUDIES: I have personally reviewed the radiological images as listed and agreed with the  findings in the report. No results found.  ASSESSMENT & PLAN:   Lymphocytosis # Leucocytosis-lymphocytosis/neutrophilia- Reactive/secondary from previous splenectomy.  No further follow-ups are recommended with regards to patient's mildly elevated leukocytosis.  # Right axillary fullness-unclear etiology; on clinical exam no evidence of any masses or lumps.  Breast exam does not show any breast masses.  Recommend screening mammogram for further evaluation.  # pigmented lesion inner lower right breast-malignant melanoma versus benign melanocytic lesions.  Recommend dermatology evaluation defer to PCP regarding referral.   # DISPOSITION: # screening mammogram # follow up with PCP  Cc; Boykin Reaper    All questions were answered. The patient knows to call the clinic with any problems, questions or concerns.       Cammie Sickle, MD 12/13/2019 2:49 PM

## 2019-12-13 NOTE — Assessment & Plan Note (Addendum)
#   Leucocytosis-lymphocytosis/neutrophilia- Reactive/secondary from previous splenectomy.  No further follow-ups are recommended with regards to patient's mildly elevated leukocytosis.  # Right axillary fullness-unclear etiology; on clinical exam no evidence of any masses or lumps.  Breast exam does not show any breast masses.  Recommend screening mammogram for further evaluation.  # pigmented lesion inner lower right breast-malignant melanoma versus benign melanocytic lesions.  Recommend dermatology evaluation defer to PCP regarding referral.   # DISPOSITION: # screening mammogram # follow up with PCP  Cc; Boykin Reaper

## 2019-12-13 NOTE — Patient Instructions (Signed)
#   speak to PCP re: dermatology referral re: skin lesion on right breast

## 2019-12-13 NOTE — Progress Notes (Signed)
RN Chaperoned provider with Breast Exam.   

## 2020-01-02 ENCOUNTER — Ambulatory Visit: Payer: Medicaid Other | Attending: Internal Medicine

## 2020-01-02 DIAGNOSIS — Z20822 Contact with and (suspected) exposure to covid-19: Secondary | ICD-10-CM

## 2020-01-04 LAB — NOVEL CORONAVIRUS, NAA: SARS-CoV-2, NAA: NOT DETECTED

## 2020-01-15 ENCOUNTER — Telehealth: Payer: Self-pay

## 2020-01-15 NOTE — Telephone Encounter (Signed)
NOTES [facility] [id number], SENT REFERRAL TO SCHEDULING

## 2020-01-19 DIAGNOSIS — E119 Type 2 diabetes mellitus without complications: Secondary | ICD-10-CM | POA: Insufficient documentation

## 2020-01-19 DIAGNOSIS — I493 Ventricular premature depolarization: Secondary | ICD-10-CM | POA: Insufficient documentation

## 2020-01-19 NOTE — Progress Notes (Signed)
Cardiology Office Note  Date:  01/20/2020   ID:  Monica Terry, DOB 1966-09-25, MRN [id number]  PCP:  Marinda Elk, MD   Chief Complaint  Patient presents with  . New Patient (Initial Visit)    Ref by Julian Hy, PA-C for PVC's. Per patient was told had an abnormal Echo; [facility]. Meds reviewed by the pt. verbally. Pt. c/o rapid pounding heart beats and shortness of breath.     HPI:  Monica Terry is a 54 year old woman with past medical history of Nuetrophilia/ lymphocytosis-SECONDARY to SPLENECTOMY alpha 1 tryspsin def -COPD Hx of viral/ bacterial meningitis; Hep C s/p treatment  diabetes Hyperlipidemia Former smoker, quit 2019, smoked since age 8 yo Bipolar d/c Chronic pain Asplenia Referred by Julian Hy for consultation of her PVCs, shortness of breath  Labs reviewed HBA1C 6.7 TG >450 Total chol 176 LDL 80s  Seen by PMD one week ago Planning was starting on phenteramine for weight loss Had EKG, showed PVC, records have been requested Had ECHO [facility], records requested  Chronic mild SOB which she attributes to her weight deconditioning and swallowing smoking history On inhalers, feels symptoms are stable SOB worse with weight gain, up to 245 pounds today Lots of sweet tea, pasta , poor diet in general  Does not appreciate PVCs  EKG personally reviewed by myself on todays visit NSr rate 72 bpm, no ST or Twave change   PMH:   has a past medical history of Abnormal uterine bleeding, Anemia, Anxiety, Arthritis, Bell's palsy, Bipolar 1 disorder ([place]), Brittle bones, COPD (chronic obstructive pulmonary disease) ([place]), Depression, Diabetes mellitus without complication ([place]), GERD (gastroesophageal reflux disease), Heart murmur, Hepatitis (2016), Hypercholesteremia, Hypothyroidism, Meningitis (01/20/2017), Migraine headache, Neck pain, Neck pain, chronic, Panic attack, Sciatica, Shortness of breath dyspnea,  Thyroid disease, and Wears dentures.  PSH:    Past Surgical History:  Procedure Laterality Date  . ABDOMINAL HYSTERECTOMY    . CARPAL TUNNEL RELEASE Right 11/22/2017   Procedure: CARPAL TUNNEL RELEASE;  Surgeon: Leanor Kail, MD;  Location: [facility];  Service: Orthopedics;  Laterality: Right;  . COLONOSCOPY WITH PROPOFOL N/A 03/27/2018   Procedure: COLONOSCOPY WITH PROPOFOL;  Surgeon: Toledo, Benay Pike, MD;  Location: [facility];  Service: Gastroenterology;  Laterality: N/A;  . ESOPHAGOGASTRODUODENOSCOPY (EGD) WITH PROPOFOL N/A 03/27/2018   Procedure: ESOPHAGOGASTRODUODENOSCOPY (EGD) WITH PROPOFOL;  Surgeon: Toledo, Benay Pike, MD;  Location: [facility];  Service: Gastroenterology;  Laterality: N/A;  . HYSTEROSCOPY WITH D & C N/A 02/08/2016   Procedure: DILATATION AND CURETTAGE /HYSTEROSCOPY;  Surgeon: Brayton Mars, MD;  Location: [facility];  Service: Gynecology;  Laterality: N/A;  . NECK SURGERY  2009   failed fusion  . SALPINGOOPHORECTOMY Bilateral 04/11/2016   Procedure: SALPINGO OOPHORECTOMY;  Surgeon: Brayton Mars, MD;  Location: [facility];  Service: Gynecology;  Laterality: Bilateral;  . SHOULDER ARTHROSCOPY Right 01/04/2019   Procedure: ARTHROSCOPY SHOULDER WITH ARTHROSCOPIC  DISTAL CLAVICLE EXCISION, SUBACROMIAL AND DECOMPRESSION BICEPS TENOTOMY;  Surgeon: Leim Fabry, MD;  Location: [facility];  Service: Orthopedics;  Laterality: Right;  TODD MUNDY ASSISTING ARTHROCARE WAND FLOW 90 WAND BARREL [place] CHAIR WITH SPYDER SLING AFTER SURGERY Diabetic - injectible  . spinal meningitis  12/2016  . spleenectomy    . TUBAL LIGATION    . VAGINAL HYSTERECTOMY Bilateral 04/11/2016   Procedure: HYSTERECTOMY VAGINAL;  Surgeon: Brayton Mars, MD;  Location: [facility];  Service: Gynecology;  Laterality: Bilateral;    Current Outpatient Medications  Medication  Sig Dispense Refill  . albuterol (PROAIR HFA) 108 (90 Base) MCG/ACT inhaler INHALE 2 PUFFS BY  MOUTH EVERY 4 HOURS ASNEEDED    . buPROPion (WELLBUTRIN XL) 150 MG 24 hr tablet Take 150 mg daily by mouth.    . cetirizine (ZYRTEC) 10 MG tablet Take 10 mg by mouth daily.     Marland Kitchen docusate sodium (COLACE) 100 MG capsule TAKE 1 CAPSULE BY MOUTH TWICE DAILY (Patient taking differently: TAKE 100 MG BY MOUTH TWICE DAILY) 30 capsule 1  . estradiol (ESTRACE) 1 MG tablet Take 1 tablet (1 mg total) by mouth daily. 30 tablet 12  . Fluticasone-Salmeterol (ADVAIR DISKUS) 250-50 MCG/DOSE AEPB Inhale 2 puffs 2 (two) times daily into the lungs.     . gabapentin (NEURONTIN) 100 MG capsule Take 100 mg 2 (two) times daily as needed by mouth (for pain).     Marland Kitchen levothyroxine (SYNTHROID) 175 MCG tablet TAKE 1 TAB BY MOUTH ONCE DAILY. TAKE ON AN EMPTY STOMACH WITH A GLASSOF WATER ATLEAST 30-60 MINUTES BEFORE BREAKFAST    . linaclotide (LINZESS) 145 MCG CAPS capsule Take 145 mcg by mouth daily.    Marland Kitchen liraglutide (VICTOZA) 18 MG/3ML SOPN INJECT 0.3 ML (1.8 MG TOTAL) SUBCUTANEOUSLY ONCE DAILY    . meloxicam (MOBIC) 15 MG tablet TAKE 1 TABLET BY MOUTH ONCE DAILY AS NEEDED    . montelukast (SINGULAIR) 10 MG tablet Take 10 mg at bedtime by mouth.    . nortriptyline (PAMELOR) 10 MG capsule Take 10 mg by mouth at bedtime.    . ondansetron (ZOFRAN ODT) 4 MG disintegrating tablet Take 1 tablet (4 mg total) by mouth every 8 (eight) hours as needed for nausea or vomiting. 20 tablet 0  . ondansetron (ZOFRAN) 4 MG tablet Take 4 mg every 8 (eight) hours as needed by mouth for nausea or vomiting.    Marland Kitchen oxyCODONE-acetaminophen (PERCOCET) 5-325 MG tablet Take 1 tablet by mouth every 4 (four) hours as needed. 12 tablet 0  . pregabalin (LYRICA) 150 MG capsule Take 100 mg by mouth 2 (two) times daily.     . ranitidine (ZANTAC) 150 MG capsule Take 150 mg daily as needed by mouth for heartburn.     . rizatriptan (MAXALT) 5 MG tablet Take 5 mg by mouth as needed for migraine. May repeat in 2 hours if needed    . simvastatin (ZOCOR) 40 MG tablet  Take 40 mg by mouth every morning.     . terbinafine (LAMISIL) 250 MG tablet TAKE 1 TABLET BY MOUTH ONCE DAILY    . tiotropium (SPIRIVA HANDIHALER) 18 MCG inhalation capsule PLACE 1 CAPSULE INTO INHALER AND INHALE ONCE DAILY    . tiZANidine (ZANAFLEX) 4 MG tablet Take 4 mg every 6 (six) hours as needed by mouth for muscle spasms.     Marland Kitchen topiramate (TOPAMAX) 25 MG tablet Take 25 mg by mouth daily.    Marland Kitchen lidocaine (XYLOCAINE) 5 % ointment Apply 1 application 2 (two) times daily as needed topically for moderate pain.     Marland Kitchen oxyCODONE (OXY IR/ROXICODONE) 5 MG immediate release tablet Take 1 tablet (5 mg total) by mouth daily as needed for severe pain. 30 tablet 0  . oxyCODONE (ROXICODONE) 5 MG immediate release tablet Take 1-2 tablets (5-10 mg total) by mouth every 4 (four) hours as needed (pain). 45 tablet 0   No current facility-administered medications for this visit.     Allergies:   Patient has no known allergies.   Social History:  The  patient  reports that she quit smoking about 1 years ago. Her smoking use included cigarettes. She started smoking about 45 years ago. She has a 4.10 pack-year smoking history. She has never used smokeless tobacco. She reports current alcohol use. She reports that she does not use drugs.   Family History:   family history includes Cervical cancer in her sister; Colon cancer in her maternal grandfather; Diabetes in her maternal grandmother and mother; Heart disease in her mother.    Review of Systems: Review of Systems  Constitutional: Negative.   HENT: Negative.   Respiratory: Positive for shortness of breath.   Cardiovascular: Negative.   Gastrointestinal: Negative.   Musculoskeletal: Negative.   Neurological: Negative.   Psychiatric/Behavioral: Negative.   All other systems reviewed and are negative.   PHYSICAL EXAM: VS:  BP 122/80 (BP Location: Right Arm, Patient Position: Sitting, Cuff Size: Normal)   Pulse 72   Ht 5\' 8"  (1.727 m)   Wt 245 lb 12  oz (111.5 kg)   LMP 01/30/2016   SpO2 98%   BMI 37.37 kg/m  , BMI Body mass index is 37.37 kg/m. GEN: Well nourished, well developed, in no acute distress, obese HEENT: normal Neck: no JVD, carotid bruits, or masses Cardiac: RRR; no murmurs, rubs, or gallops,no edema  Respiratory:  clear to auscultation bilaterally, normal work of breathing GI: soft, nontender, nondistended, + BS MS: no deformity or atrophy Skin: warm and dry, no rash Neuro:  Strength and sensation are intact Psych: euthymic mood, full affect   Recent Labs: 12/13/2019: ALT 18; BUN 18; Creatinine, Ser 0.66; Hemoglobin 12.6; Platelets 381; Potassium 4.0; Sodium 135    Lipid Panel No results found for: CHOL, HDL, LDLCALC, TRIG    Wt Readings from Last 3 Encounters:  01/20/20 245 lb 12 oz (111.5 kg)  12/13/19 214 lb (97.1 kg)  08/29/19 215 lb (97.5 kg)      ASSESSMENT AND PLAN:  Problem List Items Addressed This Visit      Cardiology Problems   PVC's (premature ventricular contractions)   Relevant Orders   EKG 12-Lead     Other   DM (diabetes mellitus), type 2 (HCC)   Chronic pain syndrome (Chronic)   Relevant Medications   topiramate (TOPAMAX) 25 MG tablet   Bipolar disorder (HCC) - Primary    Other Visit Diagnoses    Palpitations       Relevant Orders   EKG 12-Lead     PVCs Not appreciated on prior EKG reviewer on today's EKG Outside records have been requested In general she is asymptomatic, will not start beta-blockers at this time Echocardiogram done last week, records requested In the setting of normal ejection fraction and no significant structural heart disease, no further action is needed -Phentermine could be started if needed  Morbid obesity Long discussion concerning her poor diet, sweet tea, [employer] etc. Dietary guide provided Suggested meeting with a dietitian, perhaps even before starting medications  Hyperlipidemia Relatively well controlled, will be better with  weight loss  Diabetes type 2 Reasonable hemoglobin A1c Suggested dietary changes, walking program  Disposition:   F/U  As needed   Total encounter time more than 60 minutes  Greater than 50% was spent in counseling and coordination of care with the patient    Signed, Esmond Plants, M.D., Ph.D. [facility], [place]

## 2020-01-20 ENCOUNTER — Encounter: Payer: Self-pay | Admitting: Cardiovascular Disease

## 2020-01-20 ENCOUNTER — Ambulatory Visit: Payer: Medicaid Other | Admitting: Cardiovascular Disease

## 2020-01-20 ENCOUNTER — Other Ambulatory Visit: Payer: Self-pay

## 2020-01-20 VITALS — BP 122/80 | HR 72 | Ht 68.0 in | Wt 245.8 lb

## 2020-01-20 DIAGNOSIS — G894 Chronic pain syndrome: Secondary | ICD-10-CM | POA: Diagnosis not present

## 2020-01-20 DIAGNOSIS — I493 Ventricular premature depolarization: Secondary | ICD-10-CM | POA: Diagnosis not present

## 2020-01-20 DIAGNOSIS — R002 Palpitations: Secondary | ICD-10-CM

## 2020-01-20 DIAGNOSIS — E1169 Type 2 diabetes mellitus with other specified complication: Secondary | ICD-10-CM | POA: Diagnosis not present

## 2020-01-20 DIAGNOSIS — F319 Bipolar disorder, unspecified: Secondary | ICD-10-CM | POA: Diagnosis not present

## 2020-01-20 NOTE — Patient Instructions (Signed)
We are waiting for your EKG and echo  Medication Instructions:  No changes  If you need a refill on your cardiac medications before your next appointment, please call your pharmacy.    Lab work: No new labs needed   If you have labs (blood work) drawn today and your tests are completely normal, you will receive your results only by: Marland Kitchen MyChart Message (if you have MyChart) OR . A paper copy in the mail If you have any lab test that is abnormal or we need to change your treatment, we will call you to review the results.   Testing/Procedures: No new testing needed   Follow-Up: At [facility], you and your health needs are our priority.  As part of our continuing mission to provide you with exceptional heart care, we have created designated Provider Care Teams.  These Care Teams include your primary Cardiologist (physician) and Advanced Practice Providers (APPs -  Physician Assistants and Nurse Practitioners) who all work together to provide you with the care you need, when you need it.  You will need a follow up appointment as needed  . Providers on your designated Care Team:   . Murray Hodgkins, NP . Christell Faith, PA-C . Marrianne Mood, PA-C  Any Other Special Instructions Will Be Listed Below (If Applicable).  For educational health videos Log in to : [email or web address] Or : [email or web address], password : [email or web address]

## 2020-03-23 ENCOUNTER — Other Ambulatory Visit: Payer: Self-pay | Admitting: Physician Assistant

## 2020-03-23 DIAGNOSIS — Z1231 Encounter for screening mammogram for malignant neoplasm of breast: Secondary | ICD-10-CM

## 2020-03-23 DIAGNOSIS — Z780 Asymptomatic menopausal state: Secondary | ICD-10-CM

## 2020-06-16 ENCOUNTER — Emergency Department: Payer: Medicaid Other

## 2020-06-16 ENCOUNTER — Other Ambulatory Visit: Payer: Self-pay

## 2020-06-16 ENCOUNTER — Emergency Department
Admission: EM | Admit: 2020-06-16 | Discharge: 2020-06-16 | Disposition: A | Discharge status: Home / Self Care | Payer: Medicaid Other | Attending: Emergency Medicine | Admitting: Emergency Medicine

## 2020-06-16 ENCOUNTER — Encounter: Payer: Self-pay | Admitting: Emergency Medicine

## 2020-06-16 DIAGNOSIS — Z79899 Other long term (current) drug therapy: Secondary | ICD-10-CM | POA: Insufficient documentation

## 2020-06-16 DIAGNOSIS — E119 Type 2 diabetes mellitus without complications: Secondary | ICD-10-CM | POA: Insufficient documentation

## 2020-06-16 DIAGNOSIS — E039 Hypothyroidism, unspecified: Secondary | ICD-10-CM | POA: Insufficient documentation

## 2020-06-16 DIAGNOSIS — Z87891 Personal history of nicotine dependence: Secondary | ICD-10-CM | POA: Insufficient documentation

## 2020-06-16 DIAGNOSIS — N201 Calculus of ureter: Secondary | ICD-10-CM | POA: Diagnosis not present

## 2020-06-16 DIAGNOSIS — Z7984 Long term (current) use of oral hypoglycemic drugs: Secondary | ICD-10-CM | POA: Diagnosis not present

## 2020-06-16 DIAGNOSIS — J449 Chronic obstructive pulmonary disease, unspecified: Secondary | ICD-10-CM | POA: Insufficient documentation

## 2020-06-16 DIAGNOSIS — R109 Unspecified abdominal pain: Secondary | ICD-10-CM | POA: Diagnosis present

## 2020-06-16 LAB — URINALYSIS, COMPLETE (UACMP) WITH MICROSCOPIC
Bilirubin Urine: NEGATIVE
Glucose, UA: NEGATIVE mg/dL
Ketones, ur: NEGATIVE mg/dL
Leukocytes,Ua: NEGATIVE
Nitrite: NEGATIVE
Protein, ur: NEGATIVE mg/dL
RBC / HPF: >50 RBC/hpf — ABNORMAL HIGH (ref 0–5)
Specific Gravity, Urine: 1.016 (ref 1.005–1.030)
pH: 6 (ref 5.0–8.0)

## 2020-06-16 LAB — CBC
HCT: 42.5 % (ref 36.0–46.0)
Hemoglobin: 14 g/dL (ref 12.0–15.0)
MCH: 29.9 pg (ref 26.0–34.0)
MCHC: 32.9 g/dL (ref 30.0–36.0)
MCV: 90.8 fL (ref 80.0–100.0)
Platelets: 329 10*3/uL (ref 150–400)
RBC: 4.68 MIL/uL (ref 3.87–5.11)
RDW: 13.7 % (ref 11.5–15.5)
WBC: 17.4 10*3/uL — ABNORMAL HIGH (ref 4.0–10.5)
nRBC: 0 % (ref 0.0–0.2)

## 2020-06-16 LAB — BASIC METABOLIC PANEL
Anion gap: 9 (ref 5–15)
BUN: 11 mg/dL (ref 6–20)
CO2: 25 mmol/L (ref 22–32)
Calcium: 8.9 mg/dL (ref 8.9–10.3)
Chloride: 103 mmol/L (ref 98–111)
Creatinine, Ser: 0.81 mg/dL (ref 0.44–1.00)
GFR calc Af Amer: >60 mL/min (ref >60)
GFR calc non Af Amer: >60 mL/min (ref >60)
Glucose, Bld: 128 mg/dL — ABNORMAL HIGH (ref 70–99)
Potassium: 4.1 mmol/L (ref 3.5–5.1)
Sodium: 137 mmol/L (ref 135–145)

## 2020-06-16 MED ORDER — ONDANSETRON 4 MG PO TBDP: 4.0000 mg | ORAL_TABLET | Freq: Three times a day (TID) | ORAL | 0 refills | Status: DC | PRN

## 2020-06-16 MED ORDER — KETOROLAC TROMETHAMINE 30 MG/ML IJ SOLN
30.0000 mg | Freq: Once | INTRAMUSCULAR | Status: AC
  Administered 2020-06-16: 30 mg via INTRAVENOUS
  Filled 2020-06-16: qty 1

## 2020-06-16 MED ORDER — HYDROMORPHONE HCL 1 MG/ML IJ SOLN
0.5000 mg | Freq: Once | INTRAMUSCULAR | Status: AC
  Administered 2020-06-16: 0.5 mg via INTRAVENOUS
  Filled 2020-06-16: qty 1

## 2020-06-16 MED ORDER — HYDROCODONE-ACETAMINOPHEN 5-325 MG PO TABS: 1.0000 | ORAL_TABLET | ORAL | 0 refills | Status: DC | PRN

## 2020-06-16 MED ORDER — NAPROXEN 500 MG PO TABS
500.0000 mg | ORAL_TABLET | Freq: Once | ORAL | Status: AC
  Administered 2020-06-16: 500 mg via ORAL
  Filled 2020-06-16: qty 1

## 2020-06-16 MED ORDER — NAPROXEN 500 MG PO TABS
500.0000 mg | ORAL_TABLET | Freq: Two times a day (BID) | ORAL | 2 refills | Status: DC
Start: 2020-06-16 — End: 2020-11-24

## 2020-06-16 MED ORDER — FENTANYL CITRATE (PF) 100 MCG/2ML IJ SOLN
50.0000 ug | INTRAMUSCULAR | Status: DC | PRN
  Administered 2020-06-16: 50 ug via INTRAVENOUS
  Filled 2020-06-16: qty 2

## 2020-06-16 MED ORDER — TAMSULOSIN HCL 0.4 MG PO CAPS
0.4000 mg | ORAL_CAPSULE | Freq: Every day | ORAL | 0 refills | Status: DC
Start: 2020-06-16 — End: 2020-11-24

## 2020-06-16 MED ORDER — OXYCODONE-ACETAMINOPHEN 5-325 MG PO TABS
1.0000 | ORAL_TABLET | Freq: Once | ORAL | Status: AC
  Administered 2020-06-16: 1 via ORAL
  Filled 2020-06-16: qty 1

## 2020-06-16 NOTE — ED Notes (Signed)
Pt to room via wheelchair from CT in NAD. Pt A&Ox4. C/o right flank pain that started last night w/ hematuria

## 2020-06-16 NOTE — ED Triage Notes (Signed)
Pt here for acute right flank pain that radiates around to right abdomen. Pt tearful. Started with hematuria today. + nausea.  Appears uncomrortable. Increased urinary frequency.
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
## 2020-06-28 ENCOUNTER — Emergency Department
Admission: EM | Admit: 2020-06-28 | Discharge: 2020-06-28 | Disposition: A | Discharge status: Home / Self Care | Payer: Medicaid Other | Attending: Emergency Medicine | Admitting: Emergency Medicine

## 2020-06-28 ENCOUNTER — Emergency Department: Payer: Medicaid Other

## 2020-06-28 ENCOUNTER — Other Ambulatory Visit: Payer: Self-pay

## 2020-06-28 DIAGNOSIS — E039 Hypothyroidism, unspecified: Secondary | ICD-10-CM | POA: Diagnosis not present

## 2020-06-28 DIAGNOSIS — J449 Chronic obstructive pulmonary disease, unspecified: Secondary | ICD-10-CM | POA: Insufficient documentation

## 2020-06-28 DIAGNOSIS — Z87891 Personal history of nicotine dependence: Secondary | ICD-10-CM | POA: Insufficient documentation

## 2020-06-28 DIAGNOSIS — R2242 Localized swelling, mass and lump, left lower limb: Secondary | ICD-10-CM | POA: Insufficient documentation

## 2020-06-28 DIAGNOSIS — Z79899 Other long term (current) drug therapy: Secondary | ICD-10-CM | POA: Insufficient documentation

## 2020-06-28 DIAGNOSIS — Z96611 Presence of right artificial shoulder joint: Secondary | ICD-10-CM | POA: Insufficient documentation

## 2020-06-28 DIAGNOSIS — M7989 Other specified soft tissue disorders: Secondary | ICD-10-CM

## 2020-06-28 DIAGNOSIS — E119 Type 2 diabetes mellitus without complications: Secondary | ICD-10-CM | POA: Diagnosis not present

## 2020-06-28 MED ORDER — CEPHALEXIN 500 MG PO CAPS
500.0000 mg | ORAL_CAPSULE | Freq: Three times a day (TID) | ORAL | 0 refills | Status: DC
Start: 2020-06-28 — End: 2021-03-01

## 2020-06-28 MED ORDER — CEPHALEXIN 500 MG PO CAPS
500.0000 mg | ORAL_CAPSULE | Freq: Once | ORAL | Status: AC
  Administered 2020-06-28: 500 mg via ORAL
  Filled 2020-06-28: qty 1

## 2020-06-28 NOTE — ED Triage Notes (Addendum)
Pt comes POV with left leg swelling and pain for a few days with two knots around her knee. Pt states that it started just around her knee but now is swollen down to her foot and up past her knee. Pulses good in left foot.

## 2020-06-28 NOTE — ED Notes (Signed)
Patient received shot X1 week ago to help with swelling in Left knee by PCP. Patient then sent to ER due to swelling increasing below and above knee X3-4 days ago

## 2020-06-28 NOTE — Discharge Instructions (Addendum)
Follow-up with Dr. Posey Pronto if not improving in 4 to 5 days.  Return emergency department worsening.  Wear the knee brace for comfort and support.  Apply ice.  Take the antibiotic for prevention of cellulitis.

## 2020-06-28 NOTE — ED Notes (Signed)
XXL hinged knee support applied.
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
## 2020-07-09 ENCOUNTER — Ambulatory Visit: Payer: Medicaid Other | Admitting: *Deleted

## 2020-07-12 ENCOUNTER — Emergency Department: Payer: Medicaid Other

## 2020-07-12 ENCOUNTER — Encounter: Payer: Self-pay | Admitting: Emergency Medicine

## 2020-07-12 ENCOUNTER — Other Ambulatory Visit: Payer: Self-pay

## 2020-07-12 DIAGNOSIS — Z5321 Procedure and treatment not carried out due to patient leaving prior to being seen by health care provider: Secondary | ICD-10-CM | POA: Diagnosis not present

## 2020-07-12 DIAGNOSIS — M25562 Pain in left knee: Secondary | ICD-10-CM | POA: Insufficient documentation

## 2020-07-12 NOTE — ED Notes (Signed)
Patient given update, verbalized understanding.

## 2020-07-12 NOTE — ED Triage Notes (Signed)
Patient states that her dog pulled her and she fell on her knee. Patient with complaint of left knee pain.

## 2020-07-13 ENCOUNTER — Emergency Department
Admission: EM | Admit: 2020-07-13 | Discharge: 2020-07-13 | Disposition: A | Discharge status: Home / Self Care | Payer: Medicaid Other | Attending: Emergency Medicine | Admitting: Emergency Medicine

## 2020-07-13 NOTE — ED Notes (Addendum)
Patient out in lobby talking loudly stating she been here since 7 pm and was put in a waiting room and never seen.  Patient had been spoken to and reassessed several times without complaints.  Patient states she is going to another hospital.

## 2020-07-13 NOTE — ED Notes (Signed)
Patient denies having any complaints or needs at this time.

## 2020-07-15 ENCOUNTER — Ambulatory Visit: Payer: Medicaid Other | Admitting: *Deleted

## 2020-07-21 ENCOUNTER — Other Ambulatory Visit: Payer: Self-pay | Admitting: Orthopedic Surgery

## 2020-07-21 DIAGNOSIS — M25562 Pain in left knee: Secondary | ICD-10-CM

## 2020-07-29 ENCOUNTER — Ambulatory Visit: Payer: Self-pay | Admitting: Urology

## 2020-08-05 ENCOUNTER — Other Ambulatory Visit: Payer: Self-pay | Admitting: Orthopedic Surgery

## 2020-08-05 DIAGNOSIS — M25561 Pain in right knee: Secondary | ICD-10-CM

## 2020-08-10 ENCOUNTER — Other Ambulatory Visit: Payer: Self-pay

## 2020-08-10 ENCOUNTER — Ambulatory Visit
Admission: RE | Admit: 2020-08-10 | Discharge: 2020-08-10 | Disposition: A | Discharge status: Home / Self Care | Payer: Medicaid Other | Source: Ambulatory Visit | Attending: Orthopedic Surgery | Admitting: Orthopedic Surgery

## 2020-08-10 DIAGNOSIS — M25561 Pain in right knee: Secondary | ICD-10-CM | POA: Insufficient documentation

## 2020-11-05 ENCOUNTER — Other Ambulatory Visit: Payer: Self-pay | Admitting: Orthopedic Surgery

## 2020-11-06 ENCOUNTER — Other Ambulatory Visit: Payer: Self-pay | Admitting: Orthopedic Surgery

## 2020-11-06 DIAGNOSIS — M25562 Pain in left knee: Secondary | ICD-10-CM

## 2020-11-16 ENCOUNTER — Other Ambulatory Visit: Payer: Self-pay | Admitting: Orthopedic Surgery

## 2020-11-16 DIAGNOSIS — M25561 Pain in right knee: Secondary | ICD-10-CM

## 2020-11-20 ENCOUNTER — Ambulatory Visit
Admission: RE | Admit: 2020-11-20 | Discharge: 2020-11-20 | Disposition: A | Discharge status: Home / Self Care | Payer: Medicaid Other | Source: Ambulatory Visit | Attending: Orthopedic Surgery | Admitting: Orthopedic Surgery

## 2020-11-20 ENCOUNTER — Other Ambulatory Visit: Payer: Self-pay

## 2020-11-20 DIAGNOSIS — M25562 Pain in left knee: Secondary | ICD-10-CM | POA: Insufficient documentation

## 2020-11-20 DIAGNOSIS — M25561 Pain in right knee: Secondary | ICD-10-CM | POA: Insufficient documentation

## 2020-11-24 ENCOUNTER — Other Ambulatory Visit: Payer: Self-pay | Admitting: Orthopedic Surgery

## 2020-11-26 ENCOUNTER — Other Ambulatory Visit
Admission: RE | Admit: 2020-11-26 | Discharge: 2020-11-26 | Disposition: A | Discharge status: Home / Self Care | Payer: Medicaid Other | Source: Ambulatory Visit | Attending: Orthopedic Surgery | Admitting: Orthopedic Surgery

## 2020-11-26 ENCOUNTER — Other Ambulatory Visit: Payer: Self-pay

## 2020-11-26 DIAGNOSIS — E118 Type 2 diabetes mellitus with unspecified complications: Secondary | ICD-10-CM | POA: Diagnosis not present

## 2020-11-26 DIAGNOSIS — Z20822 Contact with and (suspected) exposure to covid-19: Secondary | ICD-10-CM | POA: Insufficient documentation

## 2020-11-26 DIAGNOSIS — Z01812 Encounter for preprocedural laboratory examination: Secondary | ICD-10-CM | POA: Diagnosis not present

## 2020-11-26 LAB — SARS CORONAVIRUS 2 (TAT 6-24 HRS): SARS Coronavirus 2: NEGATIVE

## 2020-11-26 MED ORDER — CEFAZOLIN SODIUM-DEXTROSE 2-4 GM/100ML-% IV SOLN
2.0000 g | INTRAVENOUS | Status: AC
  Administered 2020-11-27: 2 g via INTRAVENOUS

## 2020-11-26 MED ORDER — CHLORHEXIDINE GLUCONATE 0.12 % MT SOLN
15.0000 mL | Freq: Once | OROMUCOSAL | Status: AC
  Administered 2020-11-27: 15 mL via OROMUCOSAL

## 2020-11-26 MED ORDER — FAMOTIDINE 20 MG PO TABS
20.0000 mg | ORAL_TABLET | Freq: Once | ORAL | Status: AC
  Administered 2020-11-27: 20 mg via ORAL

## 2020-11-26 MED ORDER — ORAL CARE MOUTH RINSE: 15.0000 mL | Freq: Once | OROMUCOSAL | Status: AC

## 2020-11-26 NOTE — Patient Instructions (Signed)
Your procedure is scheduled on: Friday 11/27/20.  Report to THE FIRST FLOOR REGISTRATION DESK IN THE MEDICAL MALL ON THE MORNING OF SURGERY FIRST, THEN YOU WILL CHECK IN AT THE SURGERY INFORMATION DESK LOCATED OUTSIDE THE SAME DAY SURGERY DEPARTMENT LOCATED ON 2ND FLOOR MEDICAL MALL ENTRANCE.  To find out your arrival time please call [phone] between 1PM - 3PM on 11/26/20.   Remember: Instructions that are not followed completely may result in serious medical risk, up to and including death, or upon the discretion of your surgeon and anesthesiologist your surgery may need to be rescheduled.     __X__ 1. Do not eat food after midnight the night before your procedure.                 No gum chewing or hard candies. You may drink clear liquids up to 2 hours                 before you are scheduled to arrive for your surgery- DO NOT drink clear                 liquids within 2 hours of the start of your surgery.                 Clear Liquids include:  water, apple juice without pulp, clear carbohydrate                 drink such as Clearfast or Gatorade, Black Coffee or Tea (Do not add                 milk or creamer to coffee or tea).  __X__2.  On the morning of surgery brush your teeth with toothpaste and water, you may rinse your mouth with mouthwash if you wish.  Do not swallow any toothpaste or mouthwash.    __X__ 3.  No Alcohol for 24 hours before or after surgery.  __X__ 4.  Do Not Smoke or use e-cigarettes For 24 Hours Prior to Your Surgery.                 Do not use any chewable tobacco products for at least 6 hours prior to                 surgery.  __X__5.  Notify your doctor if there is any change in your medical condition      (cold, fever, infections).      Do NOT wear jewelry, make-up, hairpins, clips or nail polish. Do NOT wear lotions, powders, or perfumes.  Do NOT shave 48 hours prior to surgery. Men may shave face and neck. Do NOT bring valuables to the hospital.      [facility] is not responsible for any belongings or valuables.   Contacts, dentures/partials or body piercings may not be worn into surgery. Bring a case for your contacts, glasses or hearing aids, a denture cup will be supplied.    Patients discharged the day of surgery will not be allowed to drive home.     __X__ Take these medicines the morning of surgery with A SIP OF WATER:     1. albuterol (PROAIR HFA)  2. Fluticasone-Salmeterol (ADVAIR DISKUS)  3. tiotropium (SPIRIVA HANDIHALER)   4. buPROPion (WELLBUTRIN XL)   5. cetirizine (ZYRTEC)  6. estradiol (ESTRACE)  7. Oxycodone HCl   8. predniSONE (DELTASONE)  9. pregabalin (LYRICA)  10. rizatriptan (MAXALT) if needed     __X__ Shower before arrival.  __X__ Use inhalers on the day of surgery. Also bring the inhaler with you to the hospital on the morning of surgery.  __X__ Stop metformin/Janumet/Farxiga 2 days prior to surgery    __X__ Stop Anti-inflammatories 7 days before surgery such as Advil, Ibuprofen, Motrin, BC or Goodies Powder, Naprosyn, Naproxen, Aleve, Aspirin, Meloxicam. May take Tylenol if needed for pain or discomfort.   __X__Do not start taking any new herbal supplements or vitamins prior to your procedure.   Wear comfortable clothing (specific to your surgery type) to the hospital.  Plan for stool softeners for home use; pain medications have a tendency to cause constipation. You can also help prevent constipation by eating foods high in fiber such as fruits and vegetables and drinking plenty of fluids as your diet allows.  After surgery, you can prevent lung complications by doing breathing exercises.Take deep breaths and cough every 1-2 hours. Your doctor may order a device called an Incentive Spirometer to help you take deep breaths.  Please call the [place] Department at [phone] if you have any questions about these instructions.

## 2020-11-27 ENCOUNTER — Encounter: Payer: Self-pay | Admitting: Orthopedic Surgery

## 2020-11-27 ENCOUNTER — Encounter: Payer: Self-pay | Admitting: Urgent Care

## 2020-11-27 ENCOUNTER — Encounter
Admission: RE | Disposition: A | Discharge status: Home / Self Care | Payer: Self-pay | Source: Ambulatory Visit | Attending: Orthopedic Surgery

## 2020-11-27 ENCOUNTER — Ambulatory Visit: Payer: Medicaid Other | Attending: Orthopedic Surgery

## 2020-11-27 ENCOUNTER — Ambulatory Visit
Admission: RE | Admit: 2020-11-27 | Discharge: 2020-11-27 | Disposition: A | Discharge status: Home / Self Care | Payer: Medicaid Other | Source: Ambulatory Visit | Attending: Orthopedic Surgery | Admitting: Orthopedic Surgery

## 2020-11-27 ENCOUNTER — Other Ambulatory Visit: Payer: Self-pay

## 2020-11-27 DIAGNOSIS — Z79899 Other long term (current) drug therapy: Secondary | ICD-10-CM | POA: Insufficient documentation

## 2020-11-27 DIAGNOSIS — S83241A Other tear of medial meniscus, current injury, right knee, initial encounter: Secondary | ICD-10-CM | POA: Insufficient documentation

## 2020-11-27 DIAGNOSIS — Z7984 Long term (current) use of oral hypoglycemic drugs: Secondary | ICD-10-CM | POA: Diagnosis not present

## 2020-11-27 DIAGNOSIS — M1711 Unilateral primary osteoarthritis, right knee: Secondary | ICD-10-CM | POA: Insufficient documentation

## 2020-11-27 DIAGNOSIS — X58XXXA Exposure to other specified factors, initial encounter: Secondary | ICD-10-CM | POA: Diagnosis not present

## 2020-11-27 DIAGNOSIS — Z79890 Hormone replacement therapy (postmenopausal): Secondary | ICD-10-CM | POA: Insufficient documentation

## 2020-11-27 DIAGNOSIS — Z87891 Personal history of nicotine dependence: Secondary | ICD-10-CM | POA: Diagnosis not present

## 2020-11-27 DIAGNOSIS — M84361A Stress fracture, right tibia, initial encounter for fracture: Secondary | ICD-10-CM | POA: Insufficient documentation

## 2020-11-27 DIAGNOSIS — Z01818 Encounter for other preprocedural examination: Secondary | ICD-10-CM | POA: Insufficient documentation

## 2020-11-27 DIAGNOSIS — M2341 Loose body in knee, right knee: Secondary | ICD-10-CM | POA: Diagnosis not present

## 2020-11-27 DIAGNOSIS — X501XXA Overexertion from prolonged static or awkward postures, initial encounter: Secondary | ICD-10-CM | POA: Diagnosis not present

## 2020-11-27 DIAGNOSIS — Z7951 Long term (current) use of inhaled steroids: Secondary | ICD-10-CM | POA: Insufficient documentation

## 2020-11-27 DIAGNOSIS — Z419 Encounter for procedure for purposes other than remedying health state, unspecified: Secondary | ICD-10-CM

## 2020-11-27 HISTORY — PX: KNEE ARTHROSCOPY WITH MEDIAL MENISECTOMY: SHX5651

## 2020-11-27 SURGERY — ARTHROSCOPY, KNEE, WITH MEDIAL MENISCECTOMY
Anesthesia: General | Site: Knee | Laterality: Right

## 2020-11-27 MED ORDER — DEXAMETHASONE SODIUM PHOSPHATE 10 MG/ML IJ SOLN
INTRAMUSCULAR | Status: DC | PRN
  Administered 2020-11-27: 10 mg via INTRAVENOUS

## 2020-11-27 MED ORDER — EPINEPHRINE PF 1 MG/ML IJ SOLN
INTRAMUSCULAR | Status: AC
  Filled 2020-11-27: qty 1

## 2020-11-27 MED ORDER — BUPIVACAINE HCL (PF) 0.5 % IJ SOLN
INTRAMUSCULAR | Status: DC | PRN
  Administered 2020-11-27: 10 mL

## 2020-11-27 MED ORDER — LIDOCAINE HCL (CARDIAC) PF 100 MG/5ML IV SOSY
PREFILLED_SYRINGE | INTRAVENOUS | Status: DC | PRN
  Administered 2020-11-27: 100 mg via INTRAVENOUS

## 2020-11-27 MED ORDER — FENTANYL CITRATE (PF) 100 MCG/2ML IJ SOLN
INTRAMUSCULAR | Status: AC
  Administered 2020-11-27: 25 ug via INTRAVENOUS
  Filled 2020-11-27: qty 2

## 2020-11-27 MED ORDER — FENTANYL CITRATE (PF) 100 MCG/2ML IJ SOLN
INTRAMUSCULAR | Status: DC | PRN
  Administered 2020-11-27: 25 ug via INTRAVENOUS
  Administered 2020-11-27: 50 ug via INTRAVENOUS
  Administered 2020-11-27: 25 ug via INTRAVENOUS

## 2020-11-27 MED ORDER — PROPOFOL 10 MG/ML IV BOLUS
INTRAVENOUS | Status: DC | PRN
  Administered 2020-11-27: 200 mg via INTRAVENOUS

## 2020-11-27 MED ORDER — ONDANSETRON HCL 4 MG/2ML IJ SOLN
INTRAMUSCULAR | Status: AC
  Filled 2020-11-27: qty 2

## 2020-11-27 MED ORDER — LIDOCAINE HCL (PF) 2 % IJ SOLN
INTRAMUSCULAR | Status: AC
  Filled 2020-11-27: qty 5

## 2020-11-27 MED ORDER — OXYCODONE HCL 5 MG PO TABS
ORAL_TABLET | ORAL | Status: AC
  Filled 2020-11-27: qty 1

## 2020-11-27 MED ORDER — MIDAZOLAM HCL 2 MG/2ML IJ SOLN
INTRAMUSCULAR | Status: DC | PRN
  Administered 2020-11-27: 2 mg via INTRAVENOUS

## 2020-11-27 MED ORDER — FAMOTIDINE 20 MG PO TABS
ORAL_TABLET | ORAL | Status: AC
  Filled 2020-11-27: qty 1

## 2020-11-27 MED ORDER — ASPIRIN EC 325 MG PO TBEC: 325.0000 mg | DELAYED_RELEASE_TABLET | Freq: Every day | ORAL | 0 refills | Status: AC

## 2020-11-27 MED ORDER — ONDANSETRON 4 MG PO TBDP: 4.0000 mg | ORAL_TABLET | Freq: Three times a day (TID) | ORAL | 0 refills | Status: DC | PRN

## 2020-11-27 MED ORDER — BUPIVACAINE HCL (PF) 0.5 % IJ SOLN
INTRAMUSCULAR | Status: AC
  Filled 2020-11-27: qty 30

## 2020-11-27 MED ORDER — PHENYLEPHRINE HCL (PRESSORS) 10 MG/ML IV SOLN
INTRAVENOUS | Status: DC | PRN
  Administered 2020-11-27 (×3): 100 ug via INTRAVENOUS

## 2020-11-27 MED ORDER — OXYCODONE HCL 10 MG PO TABS
10.0000 mg | ORAL_TABLET | Freq: Four times a day (QID) | ORAL | 0 refills | Status: DC | PRN
Start: 2020-11-27 — End: 2021-03-12

## 2020-11-27 MED ORDER — ACETAMINOPHEN 10 MG/ML IV SOLN
INTRAVENOUS | Status: AC
  Filled 2020-11-27: qty 100

## 2020-11-27 MED ORDER — IBUPROFEN 800 MG PO TABS: 800.0000 mg | ORAL_TABLET | Freq: Three times a day (TID) | ORAL | 1 refills | Status: AC

## 2020-11-27 MED ORDER — IBUPROFEN 800 MG PO TABS: 800.0000 mg | ORAL_TABLET | Freq: Three times a day (TID) | ORAL | 1 refills | Status: DC

## 2020-11-27 MED ORDER — DEXAMETHASONE SODIUM PHOSPHATE 10 MG/ML IJ SOLN
INTRAMUSCULAR | Status: AC
  Filled 2020-11-27: qty 1

## 2020-11-27 MED ORDER — ACETAMINOPHEN 500 MG PO TABS: 1000.0000 mg | ORAL_TABLET | Freq: Three times a day (TID) | ORAL | 2 refills | Status: AC

## 2020-11-27 MED ORDER — LIDOCAINE-EPINEPHRINE (PF) 1 %-1:200000 IJ SOLN
INTRAMUSCULAR | Status: AC
  Filled 2020-11-27: qty 30

## 2020-11-27 MED ORDER — MIDAZOLAM HCL 2 MG/2ML IJ SOLN
INTRAMUSCULAR | Status: AC
  Filled 2020-11-27: qty 2

## 2020-11-27 MED ORDER — CEFAZOLIN SODIUM-DEXTROSE 2-4 GM/100ML-% IV SOLN
INTRAVENOUS | Status: AC
  Filled 2020-11-27: qty 100

## 2020-11-27 MED ORDER — LACTATED RINGERS IV SOLN
INTRAVENOUS | Status: DC | PRN
  Administered 2020-11-27: 1 mL

## 2020-11-27 MED ORDER — LIDOCAINE-EPINEPHRINE 1 %-1:100000 IJ SOLN
INTRAMUSCULAR | Status: DC | PRN
  Administered 2020-11-27: 10 mL

## 2020-11-27 MED ORDER — FENTANYL CITRATE (PF) 100 MCG/2ML IJ SOLN
INTRAMUSCULAR | Status: AC
  Filled 2020-11-27: qty 2

## 2020-11-27 MED ORDER — ONDANSETRON HCL 4 MG/2ML IJ SOLN
INTRAMUSCULAR | Status: DC | PRN
  Administered 2020-11-27: 4 mg via INTRAVENOUS

## 2020-11-27 MED ORDER — OXYCODONE HCL 10 MG PO TABS
10.0000 mg | ORAL_TABLET | Freq: Four times a day (QID) | ORAL | 0 refills | Status: DC | PRN
Start: 2020-11-27 — End: 2020-11-27

## 2020-11-27 MED ORDER — ASPIRIN EC 325 MG PO TBEC: 325.0000 mg | DELAYED_RELEASE_TABLET | Freq: Every day | ORAL | 0 refills | Status: DC

## 2020-11-27 MED ORDER — PHENYLEPHRINE HCL (PRESSORS) 10 MG/ML IV SOLN
INTRAVENOUS | Status: AC
  Filled 2020-11-27: qty 1

## 2020-11-27 MED ORDER — CHLORHEXIDINE GLUCONATE 0.12 % MT SOLN
OROMUCOSAL | Status: AC
  Filled 2020-11-27: qty 15

## 2020-11-27 MED ORDER — PROPOFOL 10 MG/ML IV BOLUS
INTRAVENOUS | Status: AC
  Filled 2020-11-27: qty 20

## 2020-11-27 MED ORDER — ACETAMINOPHEN 500 MG PO TABS: 1000.0000 mg | ORAL_TABLET | Freq: Three times a day (TID) | ORAL | 2 refills | Status: DC

## 2020-11-27 MED ORDER — OXYCODONE HCL 5 MG PO TABS
5.0000 mg | ORAL_TABLET | Freq: Once | ORAL | Status: AC
  Administered 2020-11-27: 5 mg via ORAL

## 2020-11-27 MED ORDER — ONDANSETRON HCL 4 MG/2ML IJ SOLN: 4.0000 mg | Freq: Once | INTRAMUSCULAR | Status: DC | PRN

## 2020-11-27 MED ORDER — FENTANYL CITRATE (PF) 100 MCG/2ML IJ SOLN
25.0000 ug | INTRAMUSCULAR | Status: DC | PRN
  Administered 2020-11-27 (×4): 25 ug via INTRAVENOUS

## 2020-11-27 MED ORDER — SODIUM CHLORIDE 0.9 % IV SOLN: INTRAVENOUS | Status: DC

## 2020-11-27 SURGICAL SUPPLY — 51 items
ADAPTER IRRIG TUBE 2 SPIKE SOL (ADAPTER) ×6 IMPLANT
BLADE SURG SZ11 CARB STEEL (BLADE) ×3 IMPLANT
BNDG COHESIVE 6X5 TAN STRL LF (GAUZE/BANDAGES/DRESSINGS) ×3 IMPLANT
BNDG ELASTIC 6X5.8 VLCR STR LF (GAUZE/BANDAGES/DRESSINGS) ×3 IMPLANT
BNDG ESMARK 6X12 TAN STRL LF (GAUZE/BANDAGES/DRESSINGS) ×3 IMPLANT
BUR RADIUS 3.5 (BURR) ×3 IMPLANT
BUR RADIUS 4.0X18.5 (BURR) IMPLANT
CAST PADDING 6X4YD ST 30248 (SOFTGOODS) ×2
CHLORAPREP W/TINT 26 (MISCELLANEOUS) ×3 IMPLANT
COOLER POLAR GLACIER W/PUMP (MISCELLANEOUS) ×3 IMPLANT
COVER WAND RF STERILE (DRAPES) ×3 IMPLANT
CUFF TOURN SGL QUICK 24 (TOURNIQUET CUFF)
CUFF TOURN SGL QUICK 30 (TOURNIQUET CUFF)
CUFF TRNQT CYL 24X4X16.5-23 (TOURNIQUET CUFF) IMPLANT
CUFF TRNQT CYL 30X4X21-28X (TOURNIQUET CUFF) IMPLANT
DEVICE SUCT BLK HOLE OR FLOOR (MISCELLANEOUS) ×3 IMPLANT
DRAPE ARTHRO LIMB 89X125 STRL (DRAPES) ×3 IMPLANT
DRAPE IMP U-DRAPE 54X76 (DRAPES) ×3 IMPLANT
ELECT REM PT RETURN 9FT ADLT (ELECTROSURGICAL)
ELECTRODE REM PT RTRN 9FT ADLT (ELECTROSURGICAL) IMPLANT
GAUZE SPONGE 4X4 12PLY STRL (GAUZE/BANDAGES/DRESSINGS) ×3 IMPLANT
GAUZE XEROFORM 1X8 LF (GAUZE/BANDAGES/DRESSINGS) ×3 IMPLANT
GLOVE BIOGEL PI IND STRL 8 (GLOVE) ×1 IMPLANT
GLOVE BIOGEL PI INDICATOR 8 (GLOVE) ×2
GLOVE SURG ORTHO 8.0 STRL STRW (GLOVE) ×6 IMPLANT
GOWN STRL REUS W/ TWL LRG LVL3 (GOWN DISPOSABLE) ×1 IMPLANT
GOWN STRL REUS W/ TWL XL LVL3 (GOWN DISPOSABLE) ×1 IMPLANT
GOWN STRL REUS W/TWL LRG LVL3 (GOWN DISPOSABLE) ×2
GOWN STRL REUS W/TWL XL LVL3 (GOWN DISPOSABLE) ×2
IV LACTATED RINGER IRRG 3000ML (IV SOLUTION) ×8
IV LR IRRIG 3000ML ARTHROMATIC (IV SOLUTION) ×4 IMPLANT
KIT ACCUFILL 5CC (Knees) ×1 IMPLANT
KIT KNEE SCP 414.502 (Knees) ×3 IMPLANT
KIT TURNOVER KIT A (KITS) ×3 IMPLANT
MANIFOLD NEPTUNE II (INSTRUMENTS) ×6 IMPLANT
MAT ABSORB  FLUID 56X50 GRAY (MISCELLANEOUS) ×4
MAT ABSORB FLUID 56X50 GRAY (MISCELLANEOUS) ×2 IMPLANT
PACK ARTHROSCOPY KNEE (MISCELLANEOUS) ×3 IMPLANT
PAD ABD DERMACEA PRESS 5X9 (GAUZE/BANDAGES/DRESSINGS) ×3 IMPLANT
PAD WRAPON POLAR KNEE (MISCELLANEOUS) ×1 IMPLANT
PADDING CAST COTTON 6X4 ST (SOFTGOODS) ×1 IMPLANT
PENCIL ELECTRO HAND CTR (MISCELLANEOUS) IMPLANT
SET TUBE SUCT SHAVER OUTFL 24K (TUBING) ×3 IMPLANT
SET TUBE TIP INTRA-ARTICULAR (MISCELLANEOUS) ×3 IMPLANT
STOCKINETTE BIAS CUT 6 980064 (GAUZE/BANDAGES/DRESSINGS) ×3 IMPLANT
SUT ETHILON 3-0 FS-10 30 BLK (SUTURE) ×3
SUTURE EHLN 3-0 FS-10 30 BLK (SUTURE) ×1 IMPLANT
TOWEL OR 17X26 4PK STRL BLUE (TOWEL DISPOSABLE) ×6 IMPLANT
TUBING ARTHRO INFLOW-ONLY STRL (TUBING) ×3 IMPLANT
WAND WEREWOLF FLOW 90D (MISCELLANEOUS) IMPLANT
WRAPON POLAR PAD KNEE (MISCELLANEOUS) ×3

## 2020-11-27 NOTE — OR Nursing (Signed)
Crutches given to patient at discharge per MD's order. Continue to monitor

## 2020-11-27 NOTE — Anesthesia Procedure Notes (Signed)
Procedure Name: LMA Insertion Date/Time: 11/27/2020 2:23 PM Performed by: Hedda Slade, CRNA Pre-anesthesia Checklist: Patient identified, Patient being monitored, Timeout performed, Emergency Drugs available and Suction available Patient Re-evaluated:Patient Re-evaluated prior to induction Oxygen Delivery Method: Circle system utilized Preoxygenation: Pre-oxygenation with 100% oxygen Induction Type: IV induction Ventilation: Mask ventilation without difficulty LMA: LMA inserted LMA Size: 3.5 Tube type: Oral Number of attempts: 1 Placement Confirmation: positive ETCO2 and breath sounds checked- equal and bilateral Tube secured with: Tape Dental Injury: Teeth and Oropharynx as per pre-operative assessment

## 2020-11-27 NOTE — Anesthesia Preprocedure Evaluation (Signed)
Anesthesia Evaluation  Patient identified by MRN, date of birth, ID band Patient awake    Reviewed: Allergy & Precautions, NPO status , Patient's Chart, lab work & pertinent test results  History of Anesthesia Complications Negative for: history of anesthetic complications  Airway Mallampati: II       Dental   Pulmonary neg sleep apnea, COPD,  COPD inhaler and oxygen dependent, Not current smoker, former smoker,  Alpha 1 antitypsan deficeincy          Cardiovascular (-) hypertension(-) Past MI and (-) CHF (-) dysrhythmias + Valvular Problems/Murmurs MVP      Neuro/Psych neg Seizures Anxiety Depression Bipolar Disorder    GI/Hepatic neg GERD  ,(+) Hepatitis -, C  Endo/Other  diabetes, Type 2, Oral Hypoglycemic AgentsHypothyroidism   Renal/GU negative Renal ROS     Musculoskeletal   Abdominal   Peds  Hematology   Anesthesia Other Findings   Reproductive/Obstetrics                             Anesthesia Physical Anesthesia Plan  ASA: III  Anesthesia Plan: General   Post-op Pain Management:    Induction: Intravenous  PONV Risk Score and Plan: 3 and Ondansetron, Dexamethasone and Treatment may vary due to age or medical condition  Airway Management Planned: LMA  Additional Equipment:   Intra-op Plan:   Post-operative Plan:   Informed Consent: I have reviewed the patients History and Physical, chart, labs and discussed the procedure including the risks, benefits and alternatives for the proposed anesthesia with the patient or authorized representative who has indicated his/her understanding and acceptance.       Plan Discussed with:   Anesthesia Plan Comments:         Anesthesia Quick Evaluation

## 2020-11-27 NOTE — Transfer of Care (Signed)
Immediate Anesthesia Transfer of Care Note  Patient: Monica Terry  Procedure(s) Performed: Right arthroscopic partial medial meniscectomy, chondroplasty, and medial tibial subchondroplasty (Right Knee)  Patient Location: PACU  Anesthesia Type:General  Level of Consciousness: sedated  Airway & Oxygen Therapy: Patient Spontanous Breathing and Patient connected to face mask oxygen  Post-op Assessment: Report given to RN and Post -op Vital signs reviewed and stable  Post vital signs: Reviewed and stable  Last Vitals:  Vitals Value Taken Time  BP 127/71 11/27/20 1550  Temp    Pulse 95 11/27/20 1552  Resp 14 11/27/20 1552  SpO2 100 % 11/27/20 1552  Vitals shown include unvalidated device data.  Last Pain:  Vitals:   11/27/20 1145  TempSrc: Oral  PainSc: 0-No pain         Complications: No complications documented.

## 2020-11-27 NOTE — Op Note (Signed)
Operative Note    SURGERY DATE: 11/27/2020    PRE-OP DIAGNOSIS:  1.  Right subchondral insufficiency fracture of medial tibial plateau 2.  Right medial meniscus tear 3.  Right knee degenerative changes   POST-OP DIAGNOSIS:  1.  Right subchondral insufficiency fracture of medial tibial plateau 2.  Right medial meniscus tear 3.  Right knee degenerative changes 4.  Right knee loose bodies 5. Right knee medial plica band   PROCEDURES:  1. Right knee arthroscopically assisted subchondroplasty of medial tibial plateau (treatment of medial tibial plateau fracture) 2. Right knee arthroscopy, partial medial meniscectomy 3. Right knee removal of loose bodies 4. Right knee partial synovectomy with medial plica excision 5. Right knee chondroplasty of medial and patellofemoral compartments  SURGEON: Cato Mulligan, MD   ANESTHESIA: Regional + Gen   ESTIMATED BLOOD LOSS: minimal   TOTAL IV FLUIDS: per anesthesia   INDICATION(S):  Monica Terry is a 54 y.o. female with signs and symptoms as well as MRI finding of bone marrow edema and subchondral insufficiency fracture of the medial tibial plateau with medial meniscus tear. The patient underwent over 3 months of nonsurgical management in the form of activity modifications, unloader bracing, and medical management without improvement in symptoms.  After discussion of risks, benefits, and alternatives to surgery, the patient elected to proceed.  The patient understands that she may still end up needing an arthroplasty type procedure in the future.   OPERATIVE FINDINGS:    Examination under anesthesia: A careful examination under anesthesia was performed.  Passive range of motion was: Hyperextension: 1.  Extension: 0.  Flexion: 110.  Lachman: normal. Pivot Shift: normal.  Posterior drawer: normal.  Varus stability in full extension: normal.  Varus stability in 30 degrees of flexion: normal.  Valgus stability in full extension: normal.  Valgus  stability in 30 degrees of flexion: normal.   Intra-operative findings: A thorough arthroscopic examination of the knee was performed.  The findings are: 1. Suprapatellar pouch: Normal 2. Undersurface of median ridge: Grade 4 degenerative changes 3. Medial patellar facet: Grade 1-2 degenerative changes 4. Lateral patellar facet: Grade 3-4 degenerative changes 5. Trochlea: Grade 4 degenerative changes 6. Lateral gutter/popliteus tendon: Loose body in lateral gutter measuring approximately 5 mm 7. Hoffa's fat pad: Inflamed 8. Medial gutter/plica: Significant medial plica band 9. ACL: Normal; additionally there was an unstable lateral intercondylar notch osteophyte impinging on the ACL 10. PCL: Normal 11. Medial meniscus: Complex tear of the posterior horn of the medial meniscus with a horizontal and radial tear pattern. Total affected width of the meniscus posterior horn was approximately 30% 12. Medial compartment cartilage: Diffuse grade 4 degenerative changes to the medial femoral condyle; significant grade 4 degenerative changes to the medial tibial plateau 13. Lateral meniscus: Normal 14. Lateral compartment cartilage:  Small, focal area of grade 3 degenerative change to the tibial plateau centrally and otherwise grade 1-2 degenerative changes; grade 1 degenerative changes the lateral femoral condyle   OPERATIVE REPORT:     I identified the patient in the pre-operative holding area. I marked the operative knee with my initials. I reviewed the risks and benefits of the proposed surgical intervention and the patient (and/or patient's guardian) wished to proceed.  The patient underwent regional anesthesia in the preoperative holding area.  The patient was transferred to the operative suite and placed in the supine position with all bony prominences padded.  Anesthesia was administered. Appropriate IV antibiotics were administered prior to incision. The extremity was then prepped  and draped in  standard fashion. A time out was performed confirming the correct extremity, correct patient, and correct procedure.  Using fluoroscopic guidance and correlation with the patient's MRI, a small stab incision was made over the medial tibial plateau.  A trocar with cannula was drilled into the site of the subchondral insufficiency fracture such that all of the flutes were within bone.  Calcium phosphate mixture was appropriately mixed and 4cc of calcium phosphate were injected through the cannula into the medial tibial plateau subchondral insufficiency fracture.  Appropriate filling was seen on fluoroscopy.  The arthroscope was placed back into the joint.  There was no extravasation of the calcium phosphate material into the joint.  Cannula was left in place for approximately 10 minutes until calcium phosphate had appropriately set.  Arthroscopy portals were marked. Local anesthetic was injected to the planned portal sites. The anterolateral portal was established with an 11 blade. The arthroscope was placed in the anterolateral portal and then into the suprapatellar pouch.  A diagnostic knee scope was completed with the above findings. Additionally, there was no extravasation of calcium phosphate material within the joint. The medial meniscus tear was identified.   Next the medial portal was established under needle localization.  The loose body from the lateral gutter was removed. The unstable osteophyte from the lateral intercondylar notch that was impinging on the ACL was also resected using the shaver and burr mode. After resection, there was no impingement with range of motion. Next, partial synovectomy was performed by excising the medial plica band using a combination of an ArthroCare wand and an oscillating shaver. Next, the MCL was pie-crusted to improve visualization of the posterior horn of the medial meniscus. The meniscal tear was debrided using an arthroscopic biter and an oscillating shaver until  the meniscus had stable borders. A chondroplasty was performed of the medial compartment and patellofemoral compartment such that there were stable cartilage edges without any loose fragments of cartilage. The arthroscope was withdrawn from the joint.  The incisions were closed with 3-0 Nylon suture. Sterile dressings included Xeroform, 4x4s, Sof-Rol, and Bias wrap. A Polarcare was placed.  The patient was then awakened and taken to the PACU hemodynamically stable without complication.     POSTOPERATIVE PLAN: The patient will be discharged home today once they meet PACU criteria. Aspirin 325 mg daily was prescribed for 2 weeks for DVT prophylaxis.  Physical therapy will start  in 3 to 7 days. Weight-bearing as tolerated. Follow up in 2 weeks per protocol.

## 2020-11-27 NOTE — Progress Notes (Signed)
Dr. Ronelle Nigh at bedside and reviewed EKG at this time

## 2020-11-27 NOTE — Anesthesia Postprocedure Evaluation (Signed)
Anesthesia Post Note  Patient: Monica Terry  Procedure(s) Performed: Right arthroscopic partial medial meniscectomy, chondroplasty, and medial tibial subchondroplasty (Right Knee)  Patient location during evaluation: PACU Anesthesia Type: General Level of consciousness: awake and alert Pain management: pain level controlled Vital Signs Assessment: post-procedure vital signs reviewed and stable Respiratory status: spontaneous breathing and respiratory function stable Cardiovascular status: stable Anesthetic complications: no   No complications documented.   Last Vitals:  Vitals:   11/27/20 1145  BP: (!) 142/82  Pulse: 80  Resp: 16  Temp: 36.7 C  SpO2: 98%    Last Pain:  Vitals:   11/27/20 1145  TempSrc: Oral  PainSc: 0-No pain                 [name] K

## 2020-11-27 NOTE — Discharge Instructions (Addendum)
Arthroscopic Knee Surgery - Partial Meniscectomy   Post-Op Instructions   1. Bracing or crutches: Crutches will be provided at the time of discharge from the surgery center if you do not already have them.   2. Ice: You may be provided with a device [facility]) that allows you to ice the affected area effectively. Otherwise you can ice manually.    3. Driving:  Plan on not driving for at least two weeks. Please note that you are advised NOT to drive while taking narcotic pain medications as you may be impaired and unsafe to drive.   4. Activity: Ankle pumps several times an hour while awake to prevent blood clots. Weight bearing: as tolerated. Use crutches for as needed (usually ~1 week or less) until pain allows you to ambulate without a limp. Bending and straightening the knee is unlimited. Elevate knee above heart level as much as possible for one week. Avoid standing more than 5 minutes (consecutively) for the first week.  Avoid long distance travel for 2 weeks.  5. Medications:  - You have been provided a prescription for narcotic pain medicine. After surgery, take 1-2 narcotic tablets every 4 hours if needed for severe pain.  - You may take up to 3000mg /day of tylenol (acetaminophen). You can take 1000mg  3x/day. Please check your narcotic. If you have acetaminophen in your narcotic (each tablet will be 325mg ), be careful not to exceed a total of 3000mg /day of acetaminophen.  - A prescription for anti-nausea medication will be provided in case the narcotic medicine or anesthesia causes nausea - take 1 tablet every 6 hours only if nauseated.  - Take ibuprofen 800 mg every 8 hours WITH food to reduce post-operative knee swelling. DO NOT STOP IBUPROFEN POST-OP UNTIL INSTRUCTED TO DO SO at first post-op office visit (10-14 days after surgery). However, please discontinue if you have any abdominal discomfort after taking this.  - Take enteric coated aspirin 325 mg once daily for 2 weeks to prevent  blood clots.    6. Bandages: The physical therapist should change the bandages at the first post-op appointment. If needed, the dressing supplies have been provided to you.   7. Physical Therapy: 1-2 times per week for 6 weeks. Therapy typically starts on post operative Day 3 or 4. You have been provided an order for physical therapy. The therapist will provide home exercises.   8. Work: May return to full work usually around 2 weeks after 1st post-operative visit. May do light duty/desk job in approximately 1-2 weeks when off of narcotics, pain is well-controlled, and swelling has decreased. Labor intensive jobs may require 4-6 weeks to return.      9. Post-Op Appointments: Your first post-op appointment will be with Dr. Posey Pronto in approximately 2 weeks time.    If you find that PT or post op appointment with Dr. Posey Pronto have not been scheduled or you are not aware of when they are, please call the Orthopaedic Appointment front desk at [phone].   AMBULATORY SURGERY  DISCHARGE INSTRUCTIONS   1) The drugs that you were given will stay in your system until tomorrow so for the next 24 hours you should not:  A) Drive an automobile B) Make any legal decisions C) Drink any alcoholic beverage   2) You may resume regular meals tomorrow.  Today it is better to start with liquids and gradually work up to solid foods.  You may eat anything you prefer, but it is better to start with liquids,  then soup and crackers, and gradually work up to solid foods.   3) Please notify your doctor immediately if you have any unusual bleeding, trouble breathing, redness and pain at the surgery site, drainage, fever, or pain not relieved by medication.    4) Additional Instructions:        Please contact your physician with any problems or Same Day Surgery at [phone], Monday through Friday 6 am to 4 pm, or [place] [facility] number at [phone].

## 2020-11-27 NOTE — Progress Notes (Signed)
Glucose 173

## 2020-11-27 NOTE — H&P (Signed)
Paper H&P to be scanned into permanent record. H&P reviewed. No significant changes noted.  

## 2020-11-30 LAB — GLUCOSE, CAPILLARY
Glucose-Capillary: 173 mg/dL — ABNORMAL HIGH (ref 70–99)
Glucose-Capillary: 110 mg/dL — ABNORMAL HIGH (ref 70–99)

## 2020-12-01 ENCOUNTER — Encounter: Payer: Self-pay | Admitting: Orthopedic Surgery

## 2021-01-04 ENCOUNTER — Other Ambulatory Visit: Payer: Self-pay | Admitting: Orthopedic Surgery

## 2021-01-04 DIAGNOSIS — M25562 Pain in left knee: Secondary | ICD-10-CM

## 2021-01-13 ENCOUNTER — Ambulatory Visit
Admission: RE | Admit: 2021-01-13 | Discharge: 2021-01-13 | Disposition: A | Discharge status: Home / Self Care | Payer: Medicaid Other | Source: Ambulatory Visit | Attending: Orthopedic Surgery | Admitting: Orthopedic Surgery

## 2021-01-13 ENCOUNTER — Other Ambulatory Visit: Payer: Self-pay

## 2021-01-13 DIAGNOSIS — M25562 Pain in left knee: Secondary | ICD-10-CM | POA: Diagnosis not present

## 2021-02-16 ENCOUNTER — Other Ambulatory Visit: Payer: Self-pay | Admitting: Orthopedic Surgery

## 2021-03-01 ENCOUNTER — Other Ambulatory Visit: Payer: Self-pay

## 2021-03-01 ENCOUNTER — Encounter
Admission: RE | Admit: 2021-03-01 | Discharge: 2021-03-01 | Disposition: A | Discharge status: Home / Self Care | Payer: Medicaid Other | Source: Ambulatory Visit | Attending: Orthopedic Surgery | Admitting: Orthopedic Surgery

## 2021-03-01 DIAGNOSIS — Z01812 Encounter for preprocedural laboratory examination: Secondary | ICD-10-CM | POA: Insufficient documentation

## 2021-03-01 HISTORY — DX: Other complications of anesthesia, initial encounter: T88.59XA

## 2021-03-01 LAB — COMPREHENSIVE METABOLIC PANEL
ALT: 27 U/L (ref 0–44)
AST: 45 U/L — ABNORMAL HIGH (ref 15–41)
Albumin: 3.6 g/dL (ref 3.5–5.0)
Alkaline Phosphatase: 102 U/L (ref 38–126)
Anion gap: 11 (ref 5–15)
BUN: 14 mg/dL (ref 6–20)
CO2: 23 mmol/L (ref 22–32)
Calcium: 9.1 mg/dL (ref 8.9–10.3)
Chloride: 103 mmol/L (ref 98–111)
Creatinine, Ser: 0.66 mg/dL (ref 0.44–1.00)
GFR, Estimated: >60 mL/min (ref >60)
Glucose, Bld: 145 mg/dL — ABNORMAL HIGH (ref 70–99)
Potassium: 4.5 mmol/L (ref 3.5–5.1)
Sodium: 137 mmol/L (ref 135–145)
Total Bilirubin: 1 mg/dL (ref 0.3–1.2)
Total Protein: 7 g/dL (ref 6.5–8.1)

## 2021-03-01 LAB — CBC WITH DIFFERENTIAL/PLATELET
Abs Immature Granulocytes: 0.05 10*3/uL (ref 0.00–0.07)
Basophils Absolute: 0.1 10*3/uL (ref 0.0–0.1)
Basophils Relative: 1 %
Eosinophils Absolute: 0.4 10*3/uL (ref 0.0–0.5)
Eosinophils Relative: 3 %
HCT: 41.5 % (ref 36.0–46.0)
Hemoglobin: 13.6 g/dL (ref 12.0–15.0)
Immature Granulocytes: 0 %
Lymphocytes Relative: 37 %
Lymphs Abs: 4.8 10*3/uL — ABNORMAL HIGH (ref 0.7–4.0)
MCH: 30.1 pg (ref 26.0–34.0)
MCHC: 32.8 g/dL (ref 30.0–36.0)
MCV: 91.8 fL (ref 80.0–100.0)
Monocytes Absolute: 0.7 10*3/uL (ref 0.1–1.0)
Monocytes Relative: 6 %
Neutro Abs: 6.9 10*3/uL (ref 1.7–7.7)
Neutrophils Relative %: 53 %
Platelets: 322 10*3/uL (ref 150–400)
RBC: 4.52 MIL/uL (ref 3.87–5.11)
RDW: 13.5 % (ref 11.5–15.5)
Smear Review: NORMAL
WBC: 12.9 10*3/uL — ABNORMAL HIGH (ref 4.0–10.5)
nRBC: 0 % (ref 0.0–0.2)

## 2021-03-01 LAB — URINALYSIS, ROUTINE W REFLEX MICROSCOPIC
Bilirubin Urine: NEGATIVE
Glucose, UA: NEGATIVE mg/dL
Hgb urine dipstick: NEGATIVE
Ketones, ur: NEGATIVE mg/dL
Leukocytes,Ua: NEGATIVE
Nitrite: NEGATIVE
Protein, ur: NEGATIVE mg/dL
Specific Gravity, Urine: 1.023 (ref 1.005–1.030)
pH: 5 (ref 5.0–8.0)

## 2021-03-01 LAB — TYPE AND SCREEN
ABO/RH(D): O POS
Antibody Screen: NEGATIVE

## 2021-03-01 LAB — SURGICAL PCR SCREEN
MRSA, PCR: NEGATIVE
Staphylococcus aureus: NEGATIVE

## 2021-03-01 NOTE — Patient Instructions (Signed)
Your procedure is scheduled on: 03/09/21 Report to [facility]. To find out your arrival time please call [phone] between 1PM - 3PM on 03/08/21.  Remember: Instructions that are not followed completely may result in serious medical risk, up to and including death, or upon the discretion of your surgeon and anesthesiologist your surgery may need to be rescheduled.     _X__ 1. Do not eat food after midnight the night before your procedure.                 No gum chewing or hard candies. You may drink clear liquids up to 2 hours                 before you are scheduled to arrive for your surgery- DO not drink clear                 liquids within 2 hours of the start of your surgery.                 Clear Liquids include:  water, apple juice without pulp, clear carbohydrate                 drink such as Clearfast or Gatorade, Black Coffee or Tea (Do not add                 anything to coffee or tea). Diabetics water only  __X__2.  On the morning of surgery brush your teeth with toothpaste and water, you                 may rinse your mouth with mouthwash if you wish.  Do not swallow any              toothpaste of mouthwash.     _X__ 3.  No Alcohol for 24 hours before or after surgery.   _X__ 4.  Do Not Smoke or use e-cigarettes For 24 Hours Prior to Your Surgery.                 Do not use any chewable tobacco products for at least 6 hours prior to                 surgery.  ____  5.  Bring all medications with you on the day of surgery if instructed.   __X__  6.  Notify your doctor if there is any change in your medical condition      (cold, fever, infections).     Do not wear jewelry, make-up, hairpins, clips or nail polish. Do not wear lotions, powders, or perfumes.  Do not shave 48 hours prior to surgery. Men may shave face and neck. Do not bring valuables to the hospital.    [facility] is not responsible for any belongings or  valuables.  Contacts, dentures/partials or body piercings may not be worn into surgery. Bring a case for your contacts, glasses or hearing aids, a denture cup will be supplied. Leave your suitcase in the car. After surgery it may be brought to your room. For patients admitted to the hospital, discharge time is determined by your treatment team.   Patients discharged the day of surgery will not be allowed to drive home.   Please read over the following fact sheets that you were given:   MRSA Information, chg soap, Incentive spirometer  __X__ Take these medicines the morning of surgery with A SIP  OF WATER:    1. buPROPion (WELLBUTRIN XL) 150 MG 24 hr tablet  2. cetirizine (ZYRTEC) 10 MG tablet  3. montelukast (SINGULAIR) 10 MG tablet  4. pregabalin (LYRICA) 150 MG capsule  5. predniSONE (DELTASONE) 10 MG tablet  6. Oxycodone HCl 10 MG TABS if needed  ____ Fleet Enema (as directed)   __X__ Use CHG Soap/SAGE wipes as directed  __X__ Use inhalers on the day of surgery  NEBULIZER TREATMENT BEFORE ARRIVAL ALSO  __X__ Stop metformin/Janumet/Farxiga 2 days prior to surgery  [place]  ____ Take 1/2 of usual insulin dose the night before surgery. No insulin the morning          of surgery.   ____ Stop Blood Thinners Coumadin/Plavix/Xarelto/Pleta/Pradaxa/Eliquis/Effient/Aspirin  on   Or contact your Surgeon, Cardiologist or Medical Doctor regarding  ability to stop your blood thinners  __X__ Stop Anti-inflammatories 7 days before surgery such as Advil, Ibuprofen, Motrin,  BC or Goodies Powder, Naprosyn, Naproxen, Aleve, Aspirin, MELOXICAM   __X__ Stop all herbal supplements, fish oil or vitamin E until after surgery.    ____ Bring C-Pap to the hospital.

## 2021-03-05 ENCOUNTER — Other Ambulatory Visit
Admission: RE | Admit: 2021-03-05 | Discharge: 2021-03-05 | Disposition: A | Discharge status: Home / Self Care | Payer: Medicaid Other | Source: Ambulatory Visit | Attending: Orthopedic Surgery | Admitting: Orthopedic Surgery

## 2021-03-05 ENCOUNTER — Other Ambulatory Visit: Payer: Self-pay

## 2021-03-05 DIAGNOSIS — Z20822 Contact with and (suspected) exposure to covid-19: Secondary | ICD-10-CM | POA: Insufficient documentation

## 2021-03-05 DIAGNOSIS — Z01812 Encounter for preprocedural laboratory examination: Secondary | ICD-10-CM | POA: Insufficient documentation

## 2021-03-06 LAB — SARS CORONAVIRUS 2 (TAT 6-24 HRS): SARS Coronavirus 2: NEGATIVE

## 2021-03-08 MED ORDER — ORAL CARE MOUTH RINSE: 15.0000 mL | Freq: Once | OROMUCOSAL | Status: AC

## 2021-03-08 MED ORDER — CEFAZOLIN SODIUM-DEXTROSE 2-4 GM/100ML-% IV SOLN
2.0000 g | INTRAVENOUS | Status: AC
  Administered 2021-03-09: 2 g via INTRAVENOUS

## 2021-03-08 MED ORDER — CHLORHEXIDINE GLUCONATE 0.12 % MT SOLN: 15.0000 mL | Freq: Once | OROMUCOSAL | Status: AC

## 2021-03-08 MED ORDER — SODIUM CHLORIDE 0.9 % IV SOLN: INTRAVENOUS | Status: DC

## 2021-03-08 MED ORDER — FAMOTIDINE 20 MG PO TABS: 20.0000 mg | ORAL_TABLET | Freq: Once | ORAL | Status: AC

## 2021-03-09 ENCOUNTER — Inpatient Hospital Stay
Admission: RE | Admit: 2021-03-09 | Discharge: 2021-03-12 | DRG: 470 | Disposition: A | Discharge status: Home Health | Payer: Medicaid Other | Attending: Orthopedic Surgery | Admitting: Orthopedic Surgery

## 2021-03-09 ENCOUNTER — Inpatient Hospital Stay: Payer: Medicaid Other | Admitting: Anesthesiology

## 2021-03-09 ENCOUNTER — Inpatient Hospital Stay: Payer: Medicaid Other

## 2021-03-09 ENCOUNTER — Encounter: Payer: Self-pay | Admitting: Orthopedic Surgery

## 2021-03-09 ENCOUNTER — Encounter
Admission: RE | Disposition: A | Discharge status: Home Health | Payer: Self-pay | Source: Home / Self Care | Attending: Orthopedic Surgery

## 2021-03-09 ENCOUNTER — Other Ambulatory Visit: Payer: Self-pay

## 2021-03-09 DIAGNOSIS — Z23 Encounter for immunization: Secondary | ICD-10-CM | POA: Diagnosis not present

## 2021-03-09 DIAGNOSIS — E119 Type 2 diabetes mellitus without complications: Secondary | ICD-10-CM | POA: Diagnosis present

## 2021-03-09 DIAGNOSIS — Z833 Family history of diabetes mellitus: Secondary | ICD-10-CM | POA: Diagnosis not present

## 2021-03-09 DIAGNOSIS — Z96652 Presence of left artificial knee joint: Secondary | ICD-10-CM

## 2021-03-09 DIAGNOSIS — Z7951 Long term (current) use of inhaled steroids: Secondary | ICD-10-CM | POA: Diagnosis not present

## 2021-03-09 DIAGNOSIS — Z8619 Personal history of other infectious and parasitic diseases: Secondary | ICD-10-CM | POA: Diagnosis not present

## 2021-03-09 DIAGNOSIS — F319 Bipolar disorder, unspecified: Secondary | ICD-10-CM | POA: Diagnosis present

## 2021-03-09 DIAGNOSIS — Z7984 Long term (current) use of oral hypoglycemic drugs: Secondary | ICD-10-CM | POA: Diagnosis not present

## 2021-03-09 DIAGNOSIS — G8918 Other acute postprocedural pain: Secondary | ICD-10-CM

## 2021-03-09 DIAGNOSIS — Z800 Family history of malignant neoplasm of digestive organs: Secondary | ICD-10-CM | POA: Diagnosis not present

## 2021-03-09 DIAGNOSIS — G894 Chronic pain syndrome: Secondary | ICD-10-CM | POA: Diagnosis present

## 2021-03-09 DIAGNOSIS — E8801 Alpha-1-antitrypsin deficiency: Secondary | ICD-10-CM | POA: Diagnosis present

## 2021-03-09 DIAGNOSIS — M1712 Unilateral primary osteoarthritis, left knee: Principal | ICD-10-CM | POA: Diagnosis present

## 2021-03-09 DIAGNOSIS — Z87891 Personal history of nicotine dependence: Secondary | ICD-10-CM | POA: Diagnosis not present

## 2021-03-09 DIAGNOSIS — Z8661 Personal history of infections of the central nervous system: Secondary | ICD-10-CM

## 2021-03-09 DIAGNOSIS — Z79899 Other long term (current) drug therapy: Secondary | ICD-10-CM | POA: Diagnosis not present

## 2021-03-09 DIAGNOSIS — Z981 Arthrodesis status: Secondary | ICD-10-CM

## 2021-03-09 DIAGNOSIS — Q780 Osteogenesis imperfecta: Secondary | ICD-10-CM

## 2021-03-09 DIAGNOSIS — Z20822 Contact with and (suspected) exposure to covid-19: Secondary | ICD-10-CM | POA: Diagnosis present

## 2021-03-09 DIAGNOSIS — Z6838 Body mass index (BMI) 38.0-38.9, adult: Secondary | ICD-10-CM

## 2021-03-09 DIAGNOSIS — Z79890 Hormone replacement therapy (postmenopausal): Secondary | ICD-10-CM

## 2021-03-09 DIAGNOSIS — J439 Emphysema, unspecified: Secondary | ICD-10-CM | POA: Diagnosis present

## 2021-03-09 DIAGNOSIS — Z825 Family history of asthma and other chronic lower respiratory diseases: Secondary | ICD-10-CM

## 2021-03-09 DIAGNOSIS — Z90710 Acquired absence of both cervix and uterus: Secondary | ICD-10-CM

## 2021-03-09 DIAGNOSIS — E039 Hypothyroidism, unspecified: Secondary | ICD-10-CM | POA: Diagnosis present

## 2021-03-09 DIAGNOSIS — E7800 Pure hypercholesterolemia, unspecified: Secondary | ICD-10-CM | POA: Diagnosis present

## 2021-03-09 DIAGNOSIS — Z9081 Acquired absence of spleen: Secondary | ICD-10-CM

## 2021-03-09 DIAGNOSIS — Z8249 Family history of ischemic heart disease and other diseases of the circulatory system: Secondary | ICD-10-CM | POA: Diagnosis not present

## 2021-03-09 DIAGNOSIS — E669 Obesity, unspecified: Secondary | ICD-10-CM | POA: Diagnosis present

## 2021-03-09 DIAGNOSIS — K219 Gastro-esophageal reflux disease without esophagitis: Secondary | ICD-10-CM | POA: Diagnosis present

## 2021-03-09 DIAGNOSIS — I82409 Acute embolism and thrombosis of unspecified deep veins of unspecified lower extremity: Secondary | ICD-10-CM

## 2021-03-09 HISTORY — PX: TOTAL KNEE ARTHROPLASTY: SHX125

## 2021-03-09 LAB — GLUCOSE, CAPILLARY
Glucose-Capillary: 244 mg/dL — ABNORMAL HIGH (ref 70–99)
Glucose-Capillary: 156 mg/dL — ABNORMAL HIGH (ref 70–99)
Glucose-Capillary: 194 mg/dL — ABNORMAL HIGH (ref 70–99)
Glucose-Capillary: 217 mg/dL — ABNORMAL HIGH (ref 70–99)
Glucose-Capillary: 182 mg/dL — ABNORMAL HIGH (ref 70–99)

## 2021-03-09 LAB — CBC
HCT: 38 % (ref 36.0–46.0)
Hemoglobin: 12.3 g/dL (ref 12.0–15.0)
MCH: 30.1 pg (ref 26.0–34.0)
MCHC: 32.4 g/dL (ref 30.0–36.0)
MCV: 92.9 fL (ref 80.0–100.0)
Platelets: 337 10*3/uL (ref 150–400)
RBC: 4.09 MIL/uL (ref 3.87–5.11)
RDW: 13.5 % (ref 11.5–15.5)
WBC: 15.7 10*3/uL — ABNORMAL HIGH (ref 4.0–10.5)
nRBC: 0 % (ref 0.0–0.2)

## 2021-03-09 LAB — CREATININE, SERUM
Creatinine, Ser: 0.79 mg/dL (ref 0.44–1.00)
GFR, Estimated: >60 mL/min (ref >60)

## 2021-03-09 SURGERY — ARTHROPLASTY, KNEE, TOTAL
Anesthesia: Spinal | Site: Knee | Laterality: Left

## 2021-03-09 MED ORDER — BUPIVACAINE-EPINEPHRINE (PF) 0.25% -1:200000 IJ SOLN
INTRAMUSCULAR | Status: DC | PRN
  Administered 2021-03-09: 30 mL

## 2021-03-09 MED ORDER — TRAMADOL HCL 50 MG PO TABS
50.0000 mg | ORAL_TABLET | Freq: Four times a day (QID) | ORAL | Status: DC
  Administered 2021-03-09 – 2021-03-12 (×13): 50 mg via ORAL
  Filled 2021-03-09 (×13): qty 1

## 2021-03-09 MED ORDER — METHOCARBAMOL 1000 MG/10ML IJ SOLN
500.0000 mg | Freq: Four times a day (QID) | INTRAVENOUS | Status: DC | PRN
  Administered 2021-03-09 – 2021-03-10 (×2): 500 mg via INTRAVENOUS
  Filled 2021-03-09 (×3): qty 5

## 2021-03-09 MED ORDER — PANTOPRAZOLE SODIUM 40 MG PO TBEC
40.0000 mg | DELAYED_RELEASE_TABLET | Freq: Every day | ORAL | Status: DC
  Administered 2021-03-09 – 2021-03-12 (×4): 40 mg via ORAL
  Filled 2021-03-09 (×4): qty 1

## 2021-03-09 MED ORDER — MONTELUKAST SODIUM 10 MG PO TABS
10.0000 mg | ORAL_TABLET | Freq: Every day | ORAL | Status: DC
  Administered 2021-03-10 – 2021-03-12 (×3): 10 mg via ORAL
  Filled 2021-03-09 (×4): qty 1

## 2021-03-09 MED ORDER — ALBUTEROL SULFATE (2.5 MG/3ML) 0.083% IN NEBU: 2.5000 mg | INHALATION_SOLUTION | Freq: Four times a day (QID) | RESPIRATORY_TRACT | Status: DC | PRN

## 2021-03-09 MED ORDER — FENTANYL CITRATE (PF) 100 MCG/2ML IJ SOLN
INTRAMUSCULAR | Status: AC
  Administered 2021-03-09: 25 ug via INTRAVENOUS
  Filled 2021-03-09: qty 2

## 2021-03-09 MED ORDER — SODIUM CHLORIDE 0.9 % IV SOLN
INTRAVENOUS | Status: DC | PRN
  Administered 2021-03-09: 30 ug/min via INTRAVENOUS

## 2021-03-09 MED ORDER — NALOXEGOL OXALATE 12.5 MG PO TABS
12.5000 mg | ORAL_TABLET | Freq: Every day | ORAL | Status: DC
  Administered 2021-03-09 – 2021-03-12 (×4): 12.5 mg via ORAL
  Filled 2021-03-09 (×4): qty 1

## 2021-03-09 MED ORDER — LIDOCAINE HCL (PF) 2 % IJ SOLN
INTRAMUSCULAR | Status: AC
  Filled 2021-03-09: qty 5

## 2021-03-09 MED ORDER — KETOROLAC TROMETHAMINE 30 MG/ML IJ SOLN
INTRAMUSCULAR | Status: DC | PRN
  Administered 2021-03-09: 30 mg via INTRA_ARTICULAR

## 2021-03-09 MED ORDER — ALUM & MAG HYDROXIDE-SIMETH 200-200-20 MG/5ML PO SUSP: 30.0000 mL | ORAL | Status: DC | PRN

## 2021-03-09 MED ORDER — ZOLPIDEM TARTRATE 5 MG PO TABS: 5.0000 mg | ORAL_TABLET | Freq: Every evening | ORAL | Status: DC | PRN

## 2021-03-09 MED ORDER — MAGNESIUM CITRATE PO SOLN
1.0000 | Freq: Once | ORAL | Status: DC | PRN
  Filled 2021-03-09: qty 296

## 2021-03-09 MED ORDER — METOCLOPRAMIDE HCL 10 MG PO TABS
5.0000 mg | ORAL_TABLET | Freq: Three times a day (TID) | ORAL | Status: DC | PRN
Start: 2021-03-09 — End: 2021-03-12

## 2021-03-09 MED ORDER — ALBUTEROL SULFATE HFA 108 (90 BASE) MCG/ACT IN AERS: 2.0000 | INHALATION_SPRAY | RESPIRATORY_TRACT | Status: DC | PRN

## 2021-03-09 MED ORDER — MENTHOL 3 MG MT LOZG
1.0000 | LOZENGE | OROMUCOSAL | Status: DC | PRN
  Filled 2021-03-09: qty 9

## 2021-03-09 MED ORDER — PROPOFOL 500 MG/50ML IV EMUL
INTRAVENOUS | Status: AC
  Filled 2021-03-09: qty 50

## 2021-03-09 MED ORDER — SUMATRIPTAN SUCCINATE 50 MG PO TABS
50.0000 mg | ORAL_TABLET | ORAL | Status: DC | PRN
  Filled 2021-03-09: qty 1

## 2021-03-09 MED ORDER — LEVOTHYROXINE SODIUM 200 MCG PO TABS
200.0000 ug | ORAL_TABLET | Freq: Every day | ORAL | Status: DC
  Administered 2021-03-09 – 2021-03-11 (×3): 200 ug via ORAL
  Filled 2021-03-09: qty 4
  Filled 2021-03-09 (×4): qty 1
  Filled 2021-03-09 (×2): qty 4

## 2021-03-09 MED ORDER — METHOCARBAMOL 500 MG PO TABS
500.0000 mg | ORAL_TABLET | Freq: Four times a day (QID) | ORAL | Status: DC | PRN
  Administered 2021-03-10 – 2021-03-12 (×5): 500 mg via ORAL
  Filled 2021-03-09 (×6): qty 1

## 2021-03-09 MED ORDER — ACETAMINOPHEN 325 MG PO TABS
325.0000 mg | ORAL_TABLET | Freq: Four times a day (QID) | ORAL | Status: DC | PRN
  Administered 2021-03-11: 650 mg via ORAL
  Filled 2021-03-09: qty 2

## 2021-03-09 MED ORDER — BUPIVACAINE LIPOSOME 1.3 % IJ SUSP
INTRAMUSCULAR | Status: AC
  Filled 2021-03-09: qty 20

## 2021-03-09 MED ORDER — NORTRIPTYLINE HCL 10 MG PO CAPS
10.0000 mg | ORAL_CAPSULE | Freq: Every day | ORAL | Status: DC
  Administered 2021-03-09 – 2021-03-11 (×3): 10 mg via ORAL
  Filled 2021-03-09 (×5): qty 1

## 2021-03-09 MED ORDER — MORPHINE SULFATE (PF) 10 MG/ML IV SOLN
INTRAVENOUS | Status: DC | PRN
  Administered 2021-03-09: 10 mg

## 2021-03-09 MED ORDER — MOMETASONE FURO-FORMOTEROL FUM 200-5 MCG/ACT IN AERO
2.0000 | INHALATION_SPRAY | Freq: Two times a day (BID) | RESPIRATORY_TRACT | Status: DC
  Administered 2021-03-09 – 2021-03-12 (×7): 2 via RESPIRATORY_TRACT
  Filled 2021-03-09: qty 8.8

## 2021-03-09 MED ORDER — BUPROPION HCL ER (XL) 150 MG PO TB24
150.0000 mg | ORAL_TABLET | Freq: Every day | ORAL | Status: DC
  Administered 2021-03-10 – 2021-03-12 (×3): 150 mg via ORAL
  Filled 2021-03-09 (×4): qty 1

## 2021-03-09 MED ORDER — ESTRADIOL 1 MG PO TABS
1.0000 mg | ORAL_TABLET | Freq: Every day | ORAL | Status: DC
  Administered 2021-03-10 – 2021-03-12 (×3): 1 mg via ORAL
  Filled 2021-03-09 (×4): qty 1

## 2021-03-09 MED ORDER — INSULIN ASPART 100 UNIT/ML ~~LOC~~ SOLN
8.0000 [IU] | Freq: Once | SUBCUTANEOUS | Status: AC
  Administered 2021-03-09: 8 [IU] via SUBCUTANEOUS

## 2021-03-09 MED ORDER — TERBINAFINE HCL 250 MG PO TABS
250.0000 mg | ORAL_TABLET | Freq: Every day | ORAL | Status: DC
  Administered 2021-03-10 – 2021-03-12 (×3): 250 mg via ORAL
  Filled 2021-03-09 (×4): qty 1

## 2021-03-09 MED ORDER — NEOMYCIN-POLYMYXIN B GU 40-200000 IR SOLN
Status: DC | PRN
  Administered 2021-03-09: 2 mL

## 2021-03-09 MED ORDER — INSULIN ASPART 100 UNIT/ML ~~LOC~~ SOLN
0.0000 [IU] | Freq: Three times a day (TID) | SUBCUTANEOUS | Status: DC
  Administered 2021-03-09: 5 [IU] via SUBCUTANEOUS
  Administered 2021-03-09: 3 [IU] via SUBCUTANEOUS
  Administered 2021-03-10: 5 [IU] via SUBCUTANEOUS
  Administered 2021-03-10: 3 [IU] via SUBCUTANEOUS
  Administered 2021-03-10 – 2021-03-11 (×2): 5 [IU] via SUBCUTANEOUS
  Administered 2021-03-11: 3 [IU] via SUBCUTANEOUS
  Administered 2021-03-11: 5 [IU] via SUBCUTANEOUS
  Administered 2021-03-12: 3 [IU] via SUBCUTANEOUS
  Administered 2021-03-12: 5 [IU] via SUBCUTANEOUS
  Filled 2021-03-09 (×10): qty 1

## 2021-03-09 MED ORDER — FAMOTIDINE 20 MG PO TABS
ORAL_TABLET | ORAL | Status: AC
  Administered 2021-03-09: 20 mg via ORAL
  Filled 2021-03-09: qty 1

## 2021-03-09 MED ORDER — CEFAZOLIN SODIUM-DEXTROSE 2-4 GM/100ML-% IV SOLN
2.0000 g | Freq: Four times a day (QID) | INTRAVENOUS | Status: AC
  Administered 2021-03-09 (×2): 2 g via INTRAVENOUS
  Filled 2021-03-09 (×2): qty 100

## 2021-03-09 MED ORDER — TRANEXAMIC ACID-NACL 1000-0.7 MG/100ML-% IV SOLN: 1000.0000 mg | Freq: Once | INTRAVENOUS | Status: AC

## 2021-03-09 MED ORDER — FENTANYL CITRATE (PF) 100 MCG/2ML IJ SOLN
25.0000 ug | INTRAMUSCULAR | Status: DC | PRN
  Administered 2021-03-09 (×4): 25 ug via INTRAVENOUS

## 2021-03-09 MED ORDER — TRANEXAMIC ACID-NACL 1000-0.7 MG/100ML-% IV SOLN
INTRAVENOUS | Status: AC
  Administered 2021-03-09: 1000 mg via INTRAVENOUS
  Filled 2021-03-09: qty 100

## 2021-03-09 MED ORDER — BENZONATATE 100 MG PO CAPS: 100.0000 mg | ORAL_CAPSULE | Freq: Three times a day (TID) | ORAL | Status: DC | PRN

## 2021-03-09 MED ORDER — MORPHINE SULFATE (PF) 10 MG/ML IV SOLN
INTRAVENOUS | Status: AC
  Filled 2021-03-09: qty 1

## 2021-03-09 MED ORDER — VITAMIN D (ERGOCALCIFEROL) 1.25 MG (50000 UNIT) PO CAPS
50000.0000 [IU] | ORAL_CAPSULE | ORAL | Status: DC
  Administered 2021-03-09: 50000 [IU] via ORAL
  Filled 2021-03-09: qty 1

## 2021-03-09 MED ORDER — SODIUM CHLORIDE FLUSH 0.9 % IV SOLN
INTRAVENOUS | Status: AC
  Filled 2021-03-09: qty 40

## 2021-03-09 MED ORDER — LORATADINE 10 MG PO TABS
10.0000 mg | ORAL_TABLET | Freq: Every day | ORAL | Status: DC
  Administered 2021-03-10 – 2021-03-12 (×3): 10 mg via ORAL
  Filled 2021-03-09 (×4): qty 1

## 2021-03-09 MED ORDER — SODIUM CHLORIDE 0.9 % IV SOLN: INTRAVENOUS | Status: DC

## 2021-03-09 MED ORDER — SIMVASTATIN 20 MG PO TABS
40.0000 mg | ORAL_TABLET | Freq: Every day | ORAL | Status: DC
  Administered 2021-03-09 – 2021-03-11 (×3): 40 mg via ORAL
  Filled 2021-03-09 (×3): qty 2

## 2021-03-09 MED ORDER — MIDAZOLAM HCL 5 MG/5ML IJ SOLN
INTRAMUSCULAR | Status: DC | PRN
  Administered 2021-03-09: 2 mg via INTRAVENOUS

## 2021-03-09 MED ORDER — HYDROMORPHONE HCL 1 MG/ML IJ SOLN
0.5000 mg | INTRAMUSCULAR | Status: DC | PRN
  Administered 2021-03-09 (×2): 1 mg via INTRAVENOUS
  Administered 2021-03-10: 0.5 mg via INTRAVENOUS
  Administered 2021-03-10 – 2021-03-11 (×3): 1 mg via INTRAVENOUS
  Filled 2021-03-09 (×7): qty 1

## 2021-03-09 MED ORDER — PHENOL 1.4 % MT LIQD
1.0000 | OROMUCOSAL | Status: DC | PRN
  Filled 2021-03-09: qty 177

## 2021-03-09 MED ORDER — TIOTROPIUM BROMIDE MONOHYDRATE 18 MCG IN CAPS
18.0000 ug | ORAL_CAPSULE | Freq: Every day | RESPIRATORY_TRACT | Status: DC
  Administered 2021-03-10 – 2021-03-12 (×3): 18 ug via RESPIRATORY_TRACT
  Filled 2021-03-09: qty 5

## 2021-03-09 MED ORDER — MAGNESIUM HYDROXIDE 400 MG/5ML PO SUSP: 30.0000 mL | Freq: Every day | ORAL | Status: DC | PRN

## 2021-03-09 MED ORDER — ENOXAPARIN SODIUM 30 MG/0.3ML ~~LOC~~ SOLN
30.0000 mg | Freq: Two times a day (BID) | SUBCUTANEOUS | Status: DC
  Administered 2021-03-10 – 2021-03-12 (×5): 30 mg via SUBCUTANEOUS
  Filled 2021-03-09 (×5): qty 0.3

## 2021-03-09 MED ORDER — DIPHENHYDRAMINE HCL 12.5 MG/5ML PO ELIX
12.5000 mg | ORAL_SOLUTION | ORAL | Status: DC | PRN
Start: 2021-03-09 — End: 2021-03-12

## 2021-03-09 MED ORDER — PHENYLEPHRINE HCL (PRESSORS) 10 MG/ML IV SOLN
INTRAVENOUS | Status: DC | PRN
  Administered 2021-03-09 (×3): 100 ug via INTRAVENOUS

## 2021-03-09 MED ORDER — ONDANSETRON HCL 4 MG/2ML IJ SOLN: 4.0000 mg | Freq: Once | INTRAMUSCULAR | Status: DC | PRN

## 2021-03-09 MED ORDER — METFORMIN HCL 500 MG PO TABS
500.0000 mg | ORAL_TABLET | Freq: Two times a day (BID) | ORAL | Status: DC
  Administered 2021-03-09 – 2021-03-12 (×6): 500 mg via ORAL
  Filled 2021-03-09 (×6): qty 1

## 2021-03-09 MED ORDER — SODIUM CHLORIDE 0.9 % IV SOLN
INTRAVENOUS | Status: DC | PRN
  Administered 2021-03-09: 60 mL

## 2021-03-09 MED ORDER — METOCLOPRAMIDE HCL 5 MG/ML IJ SOLN: 5.0000 mg | Freq: Three times a day (TID) | INTRAMUSCULAR | Status: DC | PRN

## 2021-03-09 MED ORDER — PROPOFOL 500 MG/50ML IV EMUL
INTRAVENOUS | Status: DC | PRN
  Administered 2021-03-09: 150 ug/kg/min via INTRAVENOUS

## 2021-03-09 MED ORDER — ONDANSETRON HCL 4 MG PO TABS: 4.0000 mg | ORAL_TABLET | Freq: Four times a day (QID) | ORAL | Status: DC | PRN

## 2021-03-09 MED ORDER — EPHEDRINE SULFATE 50 MG/ML IJ SOLN
INTRAMUSCULAR | Status: DC | PRN
  Administered 2021-03-09: 10 mg via INTRAVENOUS

## 2021-03-09 MED ORDER — INSULIN ASPART 100 UNIT/ML ~~LOC~~ SOLN
SUBCUTANEOUS | Status: AC
  Filled 2021-03-09: qty 1

## 2021-03-09 MED ORDER — BISACODYL 10 MG RE SUPP: 10.0000 mg | Freq: Every day | RECTAL | Status: DC | PRN

## 2021-03-09 MED ORDER — PROPOFOL 10 MG/ML IV BOLUS
INTRAVENOUS | Status: DC | PRN
  Administered 2021-03-09 (×2): 50 mg via INTRAVENOUS

## 2021-03-09 MED ORDER — ACETAMINOPHEN 500 MG PO TABS
1000.0000 mg | ORAL_TABLET | Freq: Four times a day (QID) | ORAL | Status: AC
  Administered 2021-03-09 – 2021-03-10 (×4): 1000 mg via ORAL
  Filled 2021-03-09 (×4): qty 2

## 2021-03-09 MED ORDER — OXYCODONE HCL 5 MG PO TABS
10.0000 mg | ORAL_TABLET | ORAL | Status: DC | PRN
  Administered 2021-03-09 (×2): 15 mg via ORAL
  Administered 2021-03-09: 10 mg via ORAL
  Administered 2021-03-10 – 2021-03-11 (×8): 15 mg via ORAL
  Administered 2021-03-11: 10 mg via ORAL
  Administered 2021-03-11 – 2021-03-12 (×4): 15 mg via ORAL
  Filled 2021-03-09 (×2): qty 3
  Filled 2021-03-09: qty 2
  Filled 2021-03-09 (×2): qty 3
  Filled 2021-03-09: qty 2
  Filled 2021-03-09 (×10): qty 3
  Filled 2021-03-09: qty 2

## 2021-03-09 MED ORDER — MIDAZOLAM HCL 2 MG/2ML IJ SOLN
INTRAMUSCULAR | Status: AC
  Filled 2021-03-09: qty 2

## 2021-03-09 MED ORDER — CHLORHEXIDINE GLUCONATE 0.12 % MT SOLN
OROMUCOSAL | Status: AC
  Administered 2021-03-09: 15 mL via OROMUCOSAL
  Filled 2021-03-09: qty 15

## 2021-03-09 MED ORDER — DOCUSATE SODIUM 100 MG PO CAPS
100.0000 mg | ORAL_CAPSULE | Freq: Two times a day (BID) | ORAL | Status: DC
  Administered 2021-03-09 – 2021-03-12 (×7): 100 mg via ORAL
  Filled 2021-03-09 (×6): qty 1

## 2021-03-09 MED ORDER — CEFAZOLIN SODIUM-DEXTROSE 2-4 GM/100ML-% IV SOLN
INTRAVENOUS | Status: AC
  Filled 2021-03-09: qty 100

## 2021-03-09 MED ORDER — PHENYLEPHRINE HCL (PRESSORS) 10 MG/ML IV SOLN
INTRAVENOUS | Status: AC
  Filled 2021-03-09: qty 2

## 2021-03-09 MED ORDER — INFLUENZA VAC SPLIT QUAD 0.5 ML IM SUSY
0.5000 mL | PREFILLED_SYRINGE | INTRAMUSCULAR | Status: AC
  Administered 2021-03-10: 0.5 mL via INTRAMUSCULAR
  Filled 2021-03-09: qty 0.5

## 2021-03-09 MED ORDER — ADULT MULTIVITAMIN W/MINERALS CH
1.0000 | ORAL_TABLET | Freq: Every day | ORAL | Status: DC
  Administered 2021-03-09 – 2021-03-12 (×4): 1 via ORAL
  Filled 2021-03-09 (×4): qty 1

## 2021-03-09 MED ORDER — LIRAGLUTIDE 18 MG/3ML ~~LOC~~ SOPN: 1.8000 mg | PEN_INJECTOR | Freq: Every evening | SUBCUTANEOUS | Status: DC

## 2021-03-09 MED ORDER — ONDANSETRON HCL 4 MG/2ML IJ SOLN: 4.0000 mg | Freq: Four times a day (QID) | INTRAMUSCULAR | Status: DC | PRN

## 2021-03-09 MED ORDER — PNEUMOCOCCAL VAC POLYVALENT 25 MCG/0.5ML IJ INJ
0.5000 mL | INJECTION | INTRAMUSCULAR | Status: AC
  Administered 2021-03-10: 0.5 mL via INTRAMUSCULAR
  Filled 2021-03-09: qty 0.5

## 2021-03-09 MED ORDER — BUPIVACAINE HCL (PF) 0.5 % IJ SOLN
INTRAMUSCULAR | Status: DC | PRN
  Administered 2021-03-09: 3 mL

## 2021-03-09 MED ORDER — BUPIVACAINE-EPINEPHRINE (PF) 0.25% -1:200000 IJ SOLN
INTRAMUSCULAR | Status: AC
  Filled 2021-03-09: qty 30

## 2021-03-09 MED ORDER — PREGABALIN 75 MG PO CAPS
150.0000 mg | ORAL_CAPSULE | Freq: Three times a day (TID) | ORAL | Status: DC
  Administered 2021-03-09 – 2021-03-12 (×9): 150 mg via ORAL
  Filled 2021-03-09 (×9): qty 2

## 2021-03-09 SURGICAL SUPPLY — 69 items
BLADE SAGITTAL 25.0X1.19X90 (BLADE) ×2 IMPLANT
BLADE SAW 90X13X1.19 OSCILLAT (BLADE) ×2 IMPLANT
BNDG ELASTIC 6X5.8 VLCR STR LF (GAUZE/BANDAGES/DRESSINGS) ×2 IMPLANT
CANISTER SUCT 1200ML W/VALVE (MISCELLANEOUS) ×2 IMPLANT
CANISTER SUCT 3000ML PPV (MISCELLANEOUS) ×4 IMPLANT
CANISTER WOUND CARE 500ML ATS (WOUND CARE) ×2 IMPLANT
CEMENT HV SMART SET (Cement) ×2 IMPLANT
CHLORAPREP W/TINT 26 (MISCELLANEOUS) ×4 IMPLANT
COOLER POLAR GLACIER W/PUMP (MISCELLANEOUS) ×2 IMPLANT
COVER WAND RF STERILE (DRAPES) ×2 IMPLANT
CUFF TOURN SGL QUICK 24 (TOURNIQUET CUFF)
CUFF TOURN SGL QUICK 30 (TOURNIQUET CUFF)
CUFF TRNQT CYL 24X4X16.5-23 (TOURNIQUET CUFF) IMPLANT
CUFF TRNQT CYL 30X4X21-28X (TOURNIQUET CUFF) IMPLANT
DRAPE 3/4 80X56 (DRAPES) ×4 IMPLANT
DRSG MEPILEX SACRM 8.7X9.8 (GAUZE/BANDAGES/DRESSINGS) ×2 IMPLANT
ELECT CAUTERY BLADE 6.4 (BLADE) ×2 IMPLANT
ELECT REM PT RETURN 9FT ADLT (ELECTROSURGICAL) ×2
ELECTRODE REM PT RTRN 9FT ADLT (ELECTROSURGICAL) ×1 IMPLANT
FEMORAL COMP SZ4 LEFT SPHERE (Femur) ×2 IMPLANT
GAUZE SPONGE 4X4 12PLY STRL (GAUZE/BANDAGES/DRESSINGS) ×2 IMPLANT
GAUZE XEROFORM 1X8 LF (GAUZE/BANDAGES/DRESSINGS) ×2 IMPLANT
GLOVE INDICATOR 8.0 STRL GRN (GLOVE) ×2 IMPLANT
GLOVE SURG ORTHO LTX SZ8 (GLOVE) ×2 IMPLANT
GLOVE SURG SYN 9.0  PF PI (GLOVE) ×1
GLOVE SURG SYN 9.0 PF PI (GLOVE) ×1 IMPLANT
GLOVE SURG UNDER POLY LF SZ9 (GLOVE) ×2 IMPLANT
GOWN SRG 2XL LVL 4 RGLN SLV (GOWNS) ×1 IMPLANT
GOWN STRL NON-REIN 2XL LVL4 (GOWNS) ×1
GOWN STRL REUS W/ TWL LRG LVL3 (GOWN DISPOSABLE) ×1 IMPLANT
GOWN STRL REUS W/ TWL XL LVL3 (GOWN DISPOSABLE) ×1 IMPLANT
GOWN STRL REUS W/TWL LRG LVL3 (GOWN DISPOSABLE) ×1
GOWN STRL REUS W/TWL XL LVL3 (GOWN DISPOSABLE) ×1
HOLDER FOLEY CATH W/STRAP (MISCELLANEOUS) ×2 IMPLANT
HOOD PEEL AWAY FLYTE STAYCOOL (MISCELLANEOUS) ×4 IMPLANT
IRRIGATION SURGIPHOR STRL (IV SOLUTION) IMPLANT
KIT PREVENA INCISION MGT20CM45 (CANNISTER) ×2 IMPLANT
KIT TURNOVER KIT A (KITS) ×2 IMPLANT
MANIFOLD NEPTUNE II (INSTRUMENTS) ×2 IMPLANT
NDL SAFETY ECLIPSE 18X1.5 (NEEDLE) ×1 IMPLANT
NEEDLE HYPO 18GX1.5 SHARP (NEEDLE) ×1
NEEDLE SPNL 18GX3.5 QUINCKE PK (NEEDLE) ×2 IMPLANT
NEEDLE SPNL 20GX3.5 QUINCKE YW (NEEDLE) ×2 IMPLANT
NS IRRIG 1000ML POUR BTL (IV SOLUTION) ×2 IMPLANT
PACK TOTAL KNEE (MISCELLANEOUS) ×2 IMPLANT
PAD WRAPON POLAR KNEE (MISCELLANEOUS) ×1 IMPLANT
PATELLA RESURFACING MEDACTA SZ (Bone Implant) ×2 IMPLANT
PENCIL SMOKE EVACUATOR COATED (MISCELLANEOUS) ×2 IMPLANT
PULSAVAC PLUS IRRIG FAN TIP (DISPOSABLE) ×2
SCALPEL PROTECTED #10 DISP (BLADE) ×4 IMPLANT
SOL .9 NS 3000ML IRR  AL (IV SOLUTION) ×1
SOL .9 NS 3000ML IRR UROMATIC (IV SOLUTION) ×1 IMPLANT
STAPLER SKIN PROX 35W (STAPLE) ×2 IMPLANT
STEM EXTENSION 11MMX30MM (Stem) ×2 IMPLANT
SUCTION FRAZIER HANDLE 10FR (MISCELLANEOUS) ×1
SUCTION TUBE FRAZIER 10FR DISP (MISCELLANEOUS) ×1 IMPLANT
SUT DVC 2 QUILL PDO  T11 36X36 (SUTURE) ×1
SUT DVC 2 QUILL PDO T11 36X36 (SUTURE) ×1 IMPLANT
SUT ETHIBOND 2 V 37 (SUTURE) IMPLANT
SUT V-LOC 90 ABS DVC 3-0 CL (SUTURE) ×2 IMPLANT
SYR 20ML LL LF (SYRINGE) ×2 IMPLANT
SYR 50ML LL SCALE MARK (SYRINGE) ×4 IMPLANT
TIBIAL INSERT SZ4 LT 02120410F (Insert) ×2 IMPLANT
TIBIAL TRAY FIXED MEDACTA 0207 (Bone Implant) ×2 IMPLANT
TIP FAN IRRIG PULSAVAC PLUS (DISPOSABLE) ×1 IMPLANT
TOWEL OR 17X26 4PK STRL BLUE (TOWEL DISPOSABLE) ×2 IMPLANT
TOWER CARTRIDGE SMART MIX (DISPOSABLE) ×2 IMPLANT
TRAY FOLEY MTR SLVR 16FR STAT (SET/KITS/TRAYS/PACK) ×2 IMPLANT
WRAPON POLAR PAD KNEE (MISCELLANEOUS) ×2

## 2021-03-09 NOTE — Anesthesia Preprocedure Evaluation (Signed)
Anesthesia Evaluation  Patient identified by MRN, date of birth, ID band Patient awake    Reviewed: Allergy & Precautions, NPO status , Patient's Chart, lab work & pertinent test results  History of Anesthesia Complications Negative for: history of anesthetic complications  Airway Mallampati: II       Dental   Pulmonary asthma , neg sleep apnea, COPD,  COPD inhaler and oxygen dependent, Not current smoker, former smoker,  alpha1 antitrypsin deficiency          Cardiovascular (-) hypertension(-) Past MI and (-) CHF (-) dysrhythmias + Valvular Problems/Murmurs      Neuro/Psych neg Seizures Anxiety Depression Bipolar Disorder    GI/Hepatic GERD  ,(+) Hepatitis -, C  Endo/Other  diabetes, Type 2, Oral Hypoglycemic AgentsHypothyroidism   Renal/GU negative Renal ROS     Musculoskeletal   Abdominal   Peds  Hematology   Anesthesia Other Findings   Reproductive/Obstetrics                             Anesthesia Physical Anesthesia Plan  ASA: III  Anesthesia Plan: Spinal   Post-op Pain Management:    Induction: Intravenous  PONV Risk Score and Plan:   Airway Management Planned:   Additional Equipment:   Intra-op Plan:   Post-operative Plan:   Informed Consent: I have reviewed the patients History and Physical, chart, labs and discussed the procedure including the risks, benefits and alternatives for the proposed anesthesia with the patient or authorized representative who has indicated his/her understanding and acceptance.       Plan Discussed with:   Anesthesia Plan Comments:         Anesthesia Quick Evaluation

## 2021-03-09 NOTE — Plan of Care (Signed)

## 2021-03-09 NOTE — Anesthesia Procedure Notes (Signed)
Spinal  Patient location during procedure: OR Start time: 03/09/2021 7:20 AM End time: 03/09/2021 7:27 AM Staffing Performed: resident/CRNA  Resident/CRNA: Nelda Marseille, CRNA Preanesthetic Checklist Completed: patient identified, IV checked, site marked, risks and benefits discussed, surgical consent, monitors and equipment checked, pre-op evaluation and timeout performed Spinal Block Patient position: sitting Prep: Betadine Patient monitoring: heart rate, continuous pulse ox, blood pressure and cardiac monitor Approach: midline Location: L3-4 Injection technique: single-shot Needle Needle type: Whitacre and Introducer  Needle gauge: 25 G Needle length: 9 cm Assessment Sensory level: T10 Additional Notes Negative paresthesia. Negative blood return. Positive free-flowing CSF. Expiration date of kit checked and confirmed. Patient tolerated procedure well, without complications.
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
## 2021-03-09 NOTE — Transfer of Care (Signed)
Immediate Anesthesia Transfer of Care Note  Patient: Monica Terry  Procedure(s) Performed: TOTAL KNEE ARTHROPLASTY - Rachelle Hora to Assist (Left Knee)  Patient Location: PACU  Anesthesia Type:Spinal  Level of Consciousness: awake, alert  and oriented  Airway & Oxygen Therapy: Patient Spontanous Breathing and Patient connected to face mask oxygen  Post-op Assessment: Report given to RN and Post -op Vital signs reviewed and stable  Post vital signs: Reviewed and stable  Last Vitals:  Vitals Value Taken Time  BP 104/65 03/09/21 0930  Temp 36.8 C 03/09/21 0930  Pulse 102 03/09/21 0938  Resp 18 03/09/21 0938  SpO2 99 % 03/09/21 0938  Vitals shown include unvalidated device data.  Last Pain:  Vitals:   03/09/21 0930  TempSrc:   PainSc: 0-No pain         Complications: No complications documented.

## 2021-03-09 NOTE — Anesthesia Procedure Notes (Signed)
Performed by: Nelda Marseille, CRNA Pre-anesthesia Checklist: Patient identified, Emergency Drugs available, Suction available, Patient being monitored and Timeout performed Oxygen Delivery Method: Simple face mask

## 2021-03-09 NOTE — Op Note (Signed)
03/09/2021  9:36 AM  PATIENT:  Monica Terry   MRN: [id number]  PRE-OPERATIVE DIAGNOSIS:  Primary localized osteoarthritis of left knee   POST-OPERATIVE DIAGNOSIS:  Same   PROCEDURE:  Procedure(s): Left TOTAL KNEE ARTHROPLASTY   SURGEON: Laurene Footman, MD   ASSISTANTS: Rachelle Hora, PA-C   ANESTHESIA:   spinal   EBL:   50   BLOOD ADMINISTERED:none   DRAINS: Incisional wound VAC    LOCAL MEDICATIONS USED:  MARCAINE    and OTHER Toradol Exparel and morphine   SPECIMEN:  No Specimen   DISPOSITION OF SPECIMEN:  N/A   COUNTS:  YES   TOURNIQUET:   71 minutes at 300 mm Hg   IMPLANTS: Medacta  GMK sphere system with  left 4 femur, left 4 tibia with short stem and  10 mm insert.  Size  3 patella, all components cemented.   DICTATION: Viviann Spare Dictation   patient was brought to the operating room and spinal anesthesia was obtained.  After prepping and draping the  left leg in sterile fashion, and after patient identification and timeout procedures were completed, tourniquet was raised  and midline skin incision was made followed by medial parapatellar arthrotomy with  severe medial compartment osteoarthritis, severe patellofemoral arthritis and  mild lateral compartment arthritis, partial synovectomy was also carried out.   The ACL and PCL and fat pad were excised along with anterior horns of the meniscus. The proximal tibia cutting guide from  the extra medullary system was applied and the proximal tibia cut carried out.  The distal femoral cut was carried out in a similar fashion using intramedullary guide with 5 degree valgus cut.   The  for femoral cutting guide applied with anterior posterior and chamfer cuts made.  The posterior horns of the menisci were removed at this point.   Injection of the above medication was carried out after the femoral and tibial cuts were carried out.  The  for baseplate trial was placed pinned into position and proximal tibial preparation carried out  with drilling hand reaming and the keel punch followed by placement of the  for femur and sizing the tibial insert size   10 millimeter gave the best fit with stability and full extension.  The distal femoral drill holes were made in the notch cut for the trochlear groove was then carried out with trials were then removed the patella was cut using the patellar cutting guide and it sized to a size  3 after drill holes have been made  The knee was irrigated with pulsatile lavage and the bony surfaces dried the tibial component was cemented into place first.  Excess cement was removed and the polyethylene insert placed with a torque screw placed with a torque screwdriver tightened.  The distal femoral component was placed and the knee was held in extension as the patellar button was clamped into place.  After the cement was set, excess cement was removed and the knee was again irrigated thoroughly thoroughly irrigated.  The tourniquet was let down and hemostasis checked with electrocautery. The arthrotomy was repaired with a heavy Quill suture,  followed by 3-0 V lock subcuticular closure, skin staples followed by incisional wound VAC and Polar Care.Marland Kitchen   PLAN OF CARE: Admit to inpatient    PATIENT DISPOSITION:  PACU - hemodynamically stable.

## 2021-03-09 NOTE — Evaluation (Signed)
Physical Therapy Evaluation Patient Details Name: Monica Terry MRN: [id number] DOB: 01/01/1966 Today's Date: 03/09/2021   History of Present Illness  Pt is a 55 y.o. female s/p L TKA secondary primary localized OA of L knee 03/09/2021.  PMH includes COPD, O2 dependent (at night), asthma, anxiety, depression, bipolar disorder, Hepatitis C, DM, valvular (heart) problems/murmurs, meningitis, anterior cervical spine fusion, joint replacement 2009.  Clinical Impression  Therapist heard someone crying and upon entering pt's room, pt noted to be in bed sobbing and appearing to be in a lot of pain (pt reporting d/t severe L knee pain).  Bone foam removed and pain immediately improved from >10/10 to 8/10 per pt report.  Pt cued for breathing techniques to calm breathing and pt gradually able to stop crying and appearing more at ease.  Nurse came into room (notified of pt's pain) and nurse gave pt pain meds.  Prior to hospital admission, pt was independent with ambulation; lives alone in 1 level home with 1 step into bedroom (ramp to enter home).  Pt then agreeable to LE ex's in bed and attempting to get to recliner.  Tolerated LE ex's in bed with extra assist for L LE and modifications as needed for pain.  Min assist for L LE semi-supine to sitting edge of bed (extra time to perform d/t significant L knee pain) but pt's pain improved resting/sitting edge of bed with L LE supported.  Then pt able to stand with min assist and walk with RW bed to recliner CGA.  Pt set up in recliner end of session and pt appearing with improved mood (pt smiling and joking around some) and pain 7/10 L knee.  Pt reporting being in recliner was much more comfortable than she was in bed.  Pt would benefit from skilled PT to address noted impairments and functional limitations (see below for any additional details).  Upon hospital discharge, pt would benefit from SNF but will monitor pt's progress and any potential updates to  recommendations.    Follow Up Recommendations SNF    Equipment Recommendations  Rolling walker with 5" wheels;3in1 (PT)    Recommendations for Other Services OT consult     Precautions / Restrictions Precautions Precautions: Fall;Knee Precaution Booklet Issued: Yes (comment) Precaution Comments: wound vac Restrictions Weight Bearing Restrictions: Yes LLE Weight Bearing: Weight bearing as tolerated      Mobility  Bed Mobility Overal bed mobility: Needs Assistance Bed Mobility: Supine to Sit     Supine to sit: Min assist;HOB elevated     General bed mobility comments: assist for L LE; vc's for technique; use of bed rail; increased time to perform d/t L knee pain    Transfers Overall transfer level: Needs assistance Equipment used: Rolling walker (2 wheeled) Transfers: Sit to/from Stand Sit to Stand: Min guard;Min assist         General transfer comment: vc's for UE/LE placement; assist to initiate stand and control descent sitting  Ambulation/Gait Ambulation/Gait assistance: Min guard Gait Distance (Feet): 3 Feet (bed to recliner) Assistive device: Rolling walker (2 wheeled)   Gait velocity: decreased   General Gait Details: antalgic; decreased stance time L LE; vc's for technique and walker use  Stairs            Wheelchair Mobility    Modified Rankin (Stroke Patients Only)       Balance Overall balance assessment: Needs assistance Sitting-balance support: No upper extremity supported;Feet supported Sitting balance-Leahy Scale: Good Sitting balance - Comments: steady sitting  reaching within BOS   Standing balance support: Single extremity supported Standing balance-Leahy Scale: Fair Standing balance comment: pt requiring at least single UE support for static standing balance                             Pertinent Vitals/Pain Pain Assessment: 0-10 Pain Score: 7  Pain Location: L knee Pain Descriptors / Indicators:  Aching;Tender;Sore Pain Intervention(s): Limited activity within patient's tolerance;Monitored during session;Premedicated before session;Repositioned;RN gave pain meds during session;Other (comment) (polar care applied and activated)  Vitals (HR and O2 on room air) stable and WFL throughout treatment session.    Home Living Family/patient expects to be discharged to:: Private residence Living Arrangements: Alone   Type of Home: House Home Access: Ramped entrance     [place]: Grab bars - tub/shower      Prior Function Level of Independence: Independent         Comments: No falls in past 6 months.  2 L O2 at night.     Hand Dominance        Extremity/Trunk Assessment   Upper Extremity Assessment Upper Extremity Assessment: Overall WFL for tasks assessed    Lower Extremity Assessment Lower Extremity Assessment: RLE deficits/detail;LLE deficits/detail RLE Deficits / Details: R LE WFL LLE Deficits / Details: fair L quad set strength; at least 3/5 AROM ankle DF/PF; requires assist for L LE SLR LLE: Unable to fully assess due to pain    Cervical / Trunk Assessment Cervical / Trunk Assessment: Normal  Communication   Communication: No difficulties  Cognition Arousal/Alertness: Awake/alert Behavior During Therapy: Anxious;Impulsive (impulsive d/t pain) Overall Cognitive Status: History of cognitive impairments - at baseline                                 General Comments: Pt reports h/o STM loss after being hospitalized d/t meningitis.      General Comments General comments (skin integrity, edema, etc.): L knee wound vac in place.  Nursing cleared pt for participation in physical therapy.  Pt agreeable to PT session.    Exercises Total Joint Exercises Ankle Circles/Pumps: AROM;Strengthening;Both;10 reps;Supine Quad Sets: AROM;Strengthening;Both;10 reps;Supine Short Arc Quad: AAROM;Strengthening;Left;10 reps;Supine  (limited ROM d/t L knee pain) Heel Slides: AAROM;Strengthening;Left;10 reps;Supine Hip ABduction/ADduction: AAROM;Strengthening;Left;10 reps;Supine Goniometric ROM: L knee extension 10 degrees short of neutral semi-supine in bed; L knee flexion AROM 60 degrees sitting in recliner   Assessment/Plan    PT Assessment Patient needs continued PT services  PT Problem List Decreased strength;Decreased range of motion;Decreased activity tolerance;Decreased balance;Decreased mobility;Decreased knowledge of use of DME;Decreased knowledge of precautions;Pain;Decreased skin integrity       PT Treatment Interventions DME instruction;Gait training;Stair training;Functional mobility training;Therapeutic activities;Therapeutic exercise;Balance training;Patient/family education    PT Goals (Current goals can be found in the Care Plan section)  Acute Rehab PT Goals Patient Stated Goal: to improve pain and mobility PT Goal Formulation: With patient Time For Goal Achievement: 03/23/21 Potential to Achieve Goals: Good    Frequency BID   Barriers to discharge Decreased caregiver support      Co-evaluation               AM-PAC PT "6 Clicks" Mobility  Outcome Measure Help needed turning from your back to your side while in a flat bed without using bedrails?: A Little Help needed moving from lying on your back  to sitting on the side of a flat bed without using bedrails?: A Little Help needed moving to and from a bed to a chair (including a wheelchair)?: A Little Help needed standing up from a chair using your arms (e.g., wheelchair or bedside chair)?: A Little Help needed to walk in hospital room?: A Lot Help needed climbing 3-5 steps with a railing? : A Lot 6 Click Score: 16    End of Session Equipment Utilized During Treatment: Gait belt Activity Tolerance: Patient limited by pain Patient left: in chair;with call bell/phone within reach;with chair alarm set;with SCD's reapplied;Other  (comment) (L heel floating via towel roll; R heel floating via pillow support; polar care in place and activated) Nurse Communication: Mobility status;Precautions;Weight bearing status;Other (comment) (pt's pain status) PT Visit Diagnosis: Other abnormalities of gait and mobility (R26.89);Muscle weakness (generalized) (M62.81);History of falling (Z91.81);Difficulty in walking, not elsewhere classified (R26.2);Pain Pain - Right/Left: Left Pain - part of body: Knee    Time: 7615-1834 PT Time Calculation (min) (ACUTE ONLY): 48 min   Charges:   PT Evaluation $PT Eval Low Complexity: 1 Low PT Treatments $Therapeutic Exercise: 8-22 mins $Therapeutic Activity: 8-22 mins       Leitha Bleak, PT 03/09/21, 5:18 PM

## 2021-03-10 ENCOUNTER — Encounter: Payer: Self-pay | Admitting: Orthopedic Surgery

## 2021-03-10 LAB — GLUCOSE, CAPILLARY
Glucose-Capillary: 204 mg/dL — ABNORMAL HIGH (ref 70–99)
Glucose-Capillary: 190 mg/dL — ABNORMAL HIGH (ref 70–99)
Glucose-Capillary: 204 mg/dL — ABNORMAL HIGH (ref 70–99)
Glucose-Capillary: 194 mg/dL — ABNORMAL HIGH (ref 70–99)

## 2021-03-10 LAB — CBC
HCT: 38.4 % (ref 36.0–46.0)
Hemoglobin: 12.4 g/dL (ref 12.0–15.0)
MCH: 30 pg (ref 26.0–34.0)
MCHC: 32.3 g/dL (ref 30.0–36.0)
MCV: 93 fL (ref 80.0–100.0)
Platelets: 280 10*3/uL (ref 150–400)
RBC: 4.13 MIL/uL (ref 3.87–5.11)
RDW: 13.8 % (ref 11.5–15.5)
WBC: 16.7 10*3/uL — ABNORMAL HIGH (ref 4.0–10.5)
nRBC: 0 % (ref 0.0–0.2)

## 2021-03-10 LAB — BASIC METABOLIC PANEL
Anion gap: 7 (ref 5–15)
BUN: 14 mg/dL (ref 6–20)
CO2: 24 mmol/L (ref 22–32)
Calcium: 8.5 mg/dL — ABNORMAL LOW (ref 8.9–10.3)
Chloride: 105 mmol/L (ref 98–111)
Creatinine, Ser: 0.66 mg/dL (ref 0.44–1.00)
GFR, Estimated: >60 mL/min (ref >60)
Glucose, Bld: 181 mg/dL — ABNORMAL HIGH (ref 70–99)
Potassium: 4.3 mmol/L (ref 3.5–5.1)
Sodium: 136 mmol/L (ref 135–145)

## 2021-03-10 MED ORDER — KETOROLAC TROMETHAMINE 15 MG/ML IJ SOLN
15.0000 mg | Freq: Three times a day (TID) | INTRAMUSCULAR | Status: DC
  Administered 2021-03-10 – 2021-03-12 (×6): 15 mg via INTRAVENOUS
  Filled 2021-03-10 (×6): qty 1

## 2021-03-10 NOTE — Evaluation (Signed)
Occupational Therapy Evaluation Patient Details Name: Monica Terry MRN: [id number] DOB: 1966-11-22 Today's Date: 03/10/2021    History of Present Illness Pt is a 55 y.o. female s/p L TKA secondary primary localized OA of L knee 03/09/2021.  PMH includes COPD, O2 dependent (at night), asthma, anxiety, depression, bipolar disorder, Hepatitis C, DM, valvular (heart) problems/murmurs, meningitis, anterior cervical spine fusion, joint replacement 2009.   Clinical Impression   Pt seen for OT evaluation this date, POD#1 from above surgery. Upon arrival to room, pt seated upright in bed. Pt reporting 9/10 pain following RN administering pain medication, however pt agreeable and motivated to participate in OT eval/tx. Pt was independent in all ADLs prior to surgery and is eager to return to PLOF with less pain and improved safety and independence. D/t pain of L knee, pt currently requires MIN GUARD-MIN A for bed mobility, SUPERVISION/SET-UP for seated UB ADLs, MIN GUARD-MIN A for functional mobility with RW, and MIN GUARD for toilet transfer/hygiene. Pt instructed in polar care mgt, falls prevention strategies, DME/AE for LB bathing and dressing tasks, and compression stocking mgt; handout provided. Pt would benefit from additional skilled OT services including additional instruction in techniques, with or without assistive devices, for dressing and bathing skills to support recall and carryover prior to discharge and ultimately to maximize safety, independence, and minimize falls risk and caregiver burden. Upon discharge, recommend home health OT and intermittent supervision/assistance from family.     Follow Up Recommendations  Home health OT;Supervision - Intermittent    Equipment Recommendations  3 in 1 bedside commode       Precautions / Restrictions Precautions Precautions: Fall;Knee Precaution Booklet Issued: Yes (comment) Precaution Comments: wound vac Restrictions Weight Bearing  Restrictions: Yes LLE Weight Bearing: Weight bearing as tolerated      Mobility Bed Mobility Overal bed mobility: Needs Assistance Bed Mobility: Supine to Sit;Sit to Supine     Supine to sit: Min guard;HOB elevated Sit to supine: Min assist;HOB elevated   General bed mobility comments: MIN A for L LE; vc's for technique; use of bed rail; increased time to perform d/t L knee pain    Transfers Overall transfer level: Needs assistance Equipment used: Rolling walker (2 wheeled) Transfers: Sit to/from Stand Sit to Stand: Min guard         General transfer comment: MIN GUARD, with pt rocking trunk for momentum during initial sit>stand transfer    Balance Overall balance assessment: Needs assistance Sitting-balance support: No upper extremity supported;Feet supported Sitting balance-Leahy Scale: Good Sitting balance - Comments: steady sitting balance while sitting EOB during UB dressing/bathing and grooming   Standing balance support: Bilateral upper extremity supported;During functional activity Standing balance-Leahy Scale: Fair Standing balance comment: MIN GUARD with RW to walk to bathroom                           ADL either performed or assessed with clinical judgement   ADL Overall ADL's : Needs assistance/impaired     Grooming: Wash/dry face;Supervision/safety;Set up;Sitting Grooming Details (indicate cue type and reason): Sitting EOB Upper Body Bathing: Supervision/ safety;Set up;Sitting Upper Body Bathing Details (indicate cue type and reason): Sitting EOB     Upper Body Dressing : Supervision/safety;Set up;Sitting Upper Body Dressing Details (indicate cue type and reason): To don hospital gown while sitting EOB     Toilet Transfer: Min guard;Ambulation;Comfort height toilet;Grab bars;RW Armed forces technical officer Details (indicate cue type and reason): Increased time and effort d/t pt  reporting significant K knee pain Toileting- Clothing Manipulation and  Hygiene: Supervision/safety;Sitting/lateral lean       Functional mobility during ADLs: Minimal assistance;Rolling walker;Min guard General ADL Comments: Pt MIN GUARD for walk to bathroom with RW. MIN A required during return to bed d/t increased fatigue and pain     Vision Baseline Vision/History: Wears glasses Wears Glasses: At all times              Pertinent Vitals/Pain Pain Assessment: 0-10 Pain Score: 9  Pain Location: L knee Pain Descriptors / Indicators: Aching;Tender;Sore Pain Intervention(s): Limited activity within patient's tolerance;Monitored during session;Premedicated before session;Repositioned;Patient requesting pain meds-RN notified;Utilized relaxation techniques        Extremity/Trunk Assessment Upper Extremity Assessment Upper Extremity Assessment: Overall WFL for tasks assessed   Lower Extremity Assessment Lower Extremity Assessment: Defer to PT evaluation       Communication Communication Communication: No difficulties   Cognition Arousal/Alertness: Awake/alert Behavior During Therapy: Anxious;Impulsive (impulsive d/t pain) Overall Cognitive Status: History of cognitive impairments - at baseline                                 General Comments: Pt alert, oriented, and motivated to participate in session. Aside from decreased attention d/t pain, cognition appears WFL this date, however chart review reveals that pt reports h/o short term memory loss after being hospitalized d/t meningitis.      Exercises Other Exercises Other Exercises: Pt instructed in polar care mgt, falls prevention strategies, DME/AE for LB bathing and dressing tasks, and compression stocking mgt; handout provided        Home Living Family/patient expects to be discharged to:: Private residence Living Arrangements: Alone   Type of Home: House Home Access: Ramped entrance     Home Layout: Multi-level Alternate Level Stairs-Number of Steps: 1 step down  kitchen to bedroom   Bathroom Shower/Tub: Teacher, early years/pre: Standard     Home Equipment: Grab bars - tub/shower          Prior Functioning/Environment Level of Independence: Independent        Comments: Independent with ADLs and functional mobility. No falls in past 6 months.  2 L O2 at night.        OT Problem List: Decreased strength;Decreased range of motion;Decreased activity tolerance;Impaired balance (sitting and/or standing);Pain      OT Treatment/Interventions: Self-care/ADL training;Therapeutic exercise;Energy conservation;DME and/or AE instruction;Splinting;Therapeutic activities;Patient/family education;Balance training    OT Goals(Current goals can be found in the care plan section) Acute Rehab OT Goals Patient Stated Goal: to improve pain and mobility OT Goal Formulation: With patient Time For Goal Achievement: 03/24/21 Potential to Achieve Goals: Good ADL Goals Pt Will Perform Grooming: with modified independence;standing Pt Will Perform Lower Body Dressing: with set-up;sit to/from stand Pt Will Transfer to Toilet: with modified independence;ambulating;bedside commode  OT Frequency: Min 2X/week    AM-PAC OT "6 Clicks" Daily Activity     Outcome Measure Help from another person eating meals?: None Help from another person taking care of personal grooming?: None Help from another person toileting, which includes using toliet, bedpan, or urinal?: A Little Help from another person bathing (including washing, rinsing, drying)?: A Lot Help from another person to put on and taking off regular upper body clothing?: A Little Help from another person to put on and taking off regular lower body clothing?: A Lot 6 Click Score: 18   End  of Session Equipment Utilized During Treatment: Surveyor, mining Communication: Mobility status;Patient requests pain meds  Activity Tolerance: Patient limited by pain Patient left: in bed;with call bell/phone  within reach;with bed alarm set  OT Visit Diagnosis: Unsteadiness on feet (R26.81);Pain Pain - Right/Left: Left Pain - part of body: Knee                Time: 7473-4037 OT Time Calculation (min): 35 min Charges:  OT General Charges $OT Visit: 1 Visit OT Evaluation $OT Eval Moderate Complexity: 1 Mod OT Treatments $Self Care/Home Management : 23-37 mins  Fredirick Maudlin, OTR/L [place]

## 2021-03-10 NOTE — Anesthesia Postprocedure Evaluation (Signed)
Anesthesia Post Note  Patient: Monica Terry  Procedure(s) Performed: TOTAL KNEE ARTHROPLASTY - Rachelle Hora to Assist (Left Knee)  Patient location during evaluation: Nursing Unit Anesthesia Type: Spinal Level of consciousness: awake and oriented Pain management: pain level controlled Vital Signs Assessment: post-procedure vital signs reviewed and stable Respiratory status: respiratory function stable Cardiovascular status: stable Postop Assessment: no headache, no backache, able to ambulate, adequate PO intake and no apparent nausea or vomiting Anesthetic complications: no   No complications documented.   Last Vitals:  Vitals:   03/10/21 0418 03/10/21 0754  BP: (!) 166/84 (!) 159/81  Pulse: 94 88  Resp: 20 18  Temp: 36.8 C 36.9 C  SpO2: 94% 96%    Last Pain:  Vitals:   03/10/21 0754  TempSrc: Oral  PainSc:                  [name]

## 2021-03-10 NOTE — Plan of Care (Signed)

## 2021-03-10 NOTE — Progress Notes (Signed)
Physical Therapy Treatment Patient Details Name: Monica Terry MRN: [id number] DOB: Sep 11, 1966 Today's Date: 03/10/2021    History of Present Illness Pt is a 55 y.o. female s/p L TKA secondary primary localized OA of L knee 03/09/2021.  PMH includes COPD, O2 dependent (at night), asthma, anxiety, depression, bipolar disorder, Hepatitis C, DM, valvular (heart) problems/murmurs, meningitis, anterior cervical spine fusion, joint replacement 2009.    PT Comments    Pt resting in bed upon PT arrival with pt's sister and boyfriend present; pt requesting to toilet.  SBA semi-supine to sitting edge of bed; CGA with transfers; and CGA ambulating 20 feet x2 with RW bed to/from bathroom.  Pt with very antalgic gait walking to bathroom and pt had a lot of difficulty concentrating to attend to vc's from therapist (d/t L knee pain).  On walk back to bed, therapist able to get pt to attend to cueing which improved gait pattern and decreased antalgic gait (smoother gait pattern noted).  L knee AROM flexion to 70 degrees.  Pt reporting 10/10 L knee pain throughout session (nurse brought pain meds end of session once pt able to have more).  Will continue to focus on strengthening, ROM, and progressive functional mobility per pt tolerance.  Current recommendations SNF d/t pain limitations but will attempt to progress pt towards home discharge if able.    Follow Up Recommendations  SNF (pending progress)     Equipment Recommendations  Rolling walker with 5" wheels;3in1 (PT)    Recommendations for Other Services OT consult     Precautions / Restrictions Precautions Precautions: Fall;Knee Precaution Booklet Issued: Yes (comment) Precaution Comments: wound vac Restrictions Weight Bearing Restrictions: Yes LLE Weight Bearing: Weight bearing as tolerated    Mobility  Bed Mobility Overal bed mobility: Needs Assistance Bed Mobility: Supine to Sit;Sit to Supine     Supine to sit: Supervision;HOB elevated  (increased time to perform on own) Sit to supine: Min assist;HOB elevated (assist for L LE)   General bed mobility comments: vc's for technique; use of bed rail; increased time to perform d/t L knee pain; pt able to scoot up in bed end of session with bed flat and use of B railings (and use of R LE)    Transfers Overall transfer level: Needs assistance Equipment used: Rolling walker (2 wheeled) Transfers: Sit to/from Stand Sit to Stand: Min guard         General transfer comment: vc's for UE/LE placement  Ambulation/Gait Ambulation/Gait assistance: Min guard Gait Distance (Feet):  (20 feet x2 (to bathroom and back)) Assistive device: Rolling walker (2 wheeled)   Gait velocity: decreased   General Gait Details: antalgic; decreased stance time L LE; vc's for technique and walker use   Stairs             Wheelchair Mobility    Modified Rankin (Stroke Patients Only)       Balance Overall balance assessment: Needs assistance Sitting-balance support: No upper extremity supported;Feet supported Sitting balance-Leahy Scale: Good Sitting balance - Comments: steady sitting reaching within BOS   Standing balance support: No upper extremity supported Standing balance-Leahy Scale: Good Standing balance comment: steady standing washing hands at sink                            Cognition Arousal/Alertness: Awake/alert Behavior During Therapy: Anxious;Impulsive (d/t pain) Overall Cognitive Status: History of cognitive impairments - at baseline  General Comments: Pt reports h/o STM loss after being hospitalized d/t meningitis.      Exercises Total Joint Exercises Goniometric ROM: L knee extension 10 degrees short of neutral semi-supine in bed; L knee flexion AROM 70 degrees sitting edge of bed    General Comments General comments (skin integrity, edema, etc.): L knee wound vac in place.  Pt agreeable to PT  session.      Pertinent Vitals/Pain Pain Assessment: 0-10 Pain Score: 10-Worst pain ever Pain Location: L knee Pain Descriptors / Indicators: Aching;Tender;Sore Pain Intervention(s): Limited activity within patient's tolerance;Monitored during session;Repositioned;Patient requesting pain meds-RN notified;RN gave pain meds during session;Other (comment) (polar care applied and activated)  Vitals (HR and O2 on room air) stable and WFL throughout treatment session.    Home Living Family/patient expects to be discharged to:: Private residence Living Arrangements: Alone   Type of Home: House Home Access: Ramped entrance   [place]: Grab bars - tub/shower      Prior Function Level of Independence: Independent      Comments: Independent with ADLs and functional mobility. No falls in past 6 months.  2 L O2 at night.   PT Goals (current goals can now be found in the care plan section) Acute Rehab PT Goals Patient Stated Goal: to improve pain and mobility PT Goal Formulation: With patient Time For Goal Achievement: 03/23/21 Potential to Achieve Goals: Good Progress towards PT goals: Progressing toward goals    Frequency    BID      PT Plan Current plan remains appropriate    Co-evaluation              AM-PAC PT "6 Clicks" Mobility   Outcome Measure  Help needed turning from your back to your side while in a flat bed without using bedrails?: A Little Help needed moving from lying on your back to sitting on the side of a flat bed without using bedrails?: A Little Help needed moving to and from a bed to a chair (including a wheelchair)?: A Little Help needed standing up from a chair using your arms (e.g., wheelchair or bedside chair)?: A Little Help needed to walk in hospital room?: A Little Help needed climbing 3-5 steps with a railing? : A Lot 6 Click Score: 17    End of Session Equipment Utilized During Treatment: Gait  belt Activity Tolerance: Patient limited by pain Patient left: in bed;with call bell/phone within reach;with bed alarm set;with nursing/sitter in room;with family/visitor present;with SCD's reapplied;Other (comment) (L heel floating via towel roll; R heel floating via pillow; polar care in place and activated) Nurse Communication: Mobility status;Patient requests pain meds;Precautions;Weight bearing status PT Visit Diagnosis: Other abnormalities of gait and mobility (R26.89);Muscle weakness (generalized) (M62.81);History of falling (Z91.81);Difficulty in walking, not elsewhere classified (R26.2);Pain Pain - Right/Left: Left Pain - part of body: Knee     Time: 1115-1201 PT Time Calculation (min) (ACUTE ONLY): 46 min  Charges:  $Gait Training: 8-22 mins $Therapeutic Exercise: 8-22 mins $Therapeutic Activity: 8-22 mins                    Leitha Bleak, PT 03/10/21, 12:24 PM

## 2021-03-10 NOTE — Progress Notes (Signed)
Physical Therapy Treatment Patient Details Name: Monica Terry MRN: [id number] DOB: 02-28-1966 Today's Date: 03/10/2021    History of Present Illness Pt is a 55 y.o. female s/p L TKA secondary primary localized OA of L knee 03/09/2021.  PMH includes COPD, O2 dependent (at night), asthma, anxiety, depression, bipolar disorder, Hepatitis C, DM, valvular (heart) problems/murmurs, meningitis, anterior cervical spine fusion, joint replacement 2009.    PT Comments    Pt in bed upon PT arrival; pt crying/sobbing in pain (L knee).  Therapist attempted to move/reposition pt to improve pain but pain minimally improved and pt reporting intermittent L LE spasms (therapist also focusing on breathing techniques and distraction methods to calm pt).  Nurse arrived and then brought pt pain medications (IV Robaxin and IV Dilaudid).  Pt's pain gradually improved and pt agreeable to sitting on edge of bed d/t being hot/sweaty (from pain) and wanting to cool down.  Pt given cool wash cloth and pt washed her face and arms and then pt requested therapist assist with wiping down her back with cool wash cloth.  Pt then reporting feeling a lot better and agreeable to standing with walker (CGA).  Pt took a few steps with RW laterally to L along bed for positioning prior to sitting down (limited d/t L knee pain).  Then pt assisted back to bed and repositioned (pt reporting L knee pain 8/10 end of session but appearing with improved mood and appearing much more comfortable than beginning of session).  Will continue to focus on strengthening, knee ROM, and progressive functional mobility per pt tolerance.   Follow Up Recommendations  SNF (pending progress)     Equipment Recommendations  Rolling walker with 5" wheels;3in1 (PT)    Recommendations for Other Services OT consult     Precautions / Restrictions Precautions Precautions: Fall;Knee Precaution Booklet Issued: Yes (comment) Precaution Comments: wound  vac Restrictions Weight Bearing Restrictions: Yes LLE Weight Bearing: Weight bearing as tolerated    Mobility  Bed Mobility Overal bed mobility: Needs Assistance Bed Mobility: Supine to Sit;Sit to Supine     Supine to sit: Min assist;HOB elevated (assist for L LE; vc's for technique) Sit to supine: Min assist;HOB elevated (assist for L LE; vc's for technique)   General bed mobility comments: vc's for technique; use of bed rail; increased time to perform d/t L knee pain; pt able to scoot up in bed end of session with bed flat and use of B railings (and use of R LE)    Transfers Overall transfer level: Needs assistance Equipment used: Rolling walker (2 wheeled) Transfers: Sit to/from Stand Sit to Stand: Min guard         General transfer comment: vc's for UE/LE placement  Ambulation/Gait Ambulation/Gait assistance: Min guard Gait Distance (Feet):  (sidestepped a few feet to L along bed) Assistive device: Rolling walker (2 wheeled)   Gait velocity: decreased   General Gait Details: antalgic; decreased stance time L LE; vc's for technique and walker use   Stairs             Wheelchair Mobility    Modified Rankin (Stroke Patients Only)       Balance Overall balance assessment: Needs assistance Sitting-balance support: No upper extremity supported;Feet supported Sitting balance-Leahy Scale: Good Sitting balance - Comments: steady sitting reaching within BOS   Standing balance support: Single extremity supported Standing balance-Leahy Scale: Fair Standing balance comment: pt requiring at least single UE support for static standing balance  Cognition Arousal/Alertness: Awake/alert Behavior During Therapy: Anxious;Impulsive (d/t pain) Overall Cognitive Status: History of cognitive impairments - at baseline                                 General Comments: Pt reports h/o STM loss after being hospitalized  d/t meningitis.      Exercises Total Joint Exercises Ankle Circles/Pumps: AROM;Strengthening;Both;10 reps;Supine Quad Sets: AROM;Strengthening;Both;10 reps;Supine Gluteal Sets: AROM;Strengthening;Both;10 reps;Supine Heel Slides: AAROM;Strengthening;Left;Supine (10 reps x2 (limited ROM d/t L knee pain)) Hip ABduction/ADduction: AAROM;Strengthening;Left;10 reps;Supine Goniometric ROM: L knee extension 10 degrees short of neutral semi-supine in bed; L knee flexion AROM 70 degrees sitting edge of bed    General Comments General comments (skin integrity, edema, etc.): L knee wound vac in place      Pertinent Vitals/Pain Pain Assessment: 0-10 Pain Score: 8  Pain Location: L knee Pain Descriptors / Indicators: Aching;Tender;Sore Pain Intervention(s): Limited activity within patient's tolerance;Monitored during session;Repositioned;RN gave pain meds during session;Other (comment) (polar care applied and activated)  Vitals (HR and O2 on room air) stable and WFL throughout treatment session.    Home Living                      Prior Function            PT Goals (current goals can now be found in the care plan section) Acute Rehab PT Goals Patient Stated Goal: to improve pain and mobility PT Goal Formulation: With patient Time For Goal Achievement: 03/23/21 Potential to Achieve Goals: Good Progress towards PT goals: Progressing toward goals    Frequency    BID      PT Plan Current plan remains appropriate    Co-evaluation              AM-PAC PT "6 Clicks" Mobility   Outcome Measure  Help needed turning from your back to your side while in a flat bed without using bedrails?: A Little Help needed moving from lying on your back to sitting on the side of a flat bed without using bedrails?: A Little Help needed moving to and from a bed to a chair (including a wheelchair)?: A Little Help needed standing up from a chair using your arms (e.g., wheelchair or bedside  chair)?: A Little Help needed to walk in hospital room?: A Little Help needed climbing 3-5 steps with a railing? : A Lot 6 Click Score: 17    End of Session Equipment Utilized During Treatment: Gait belt Activity Tolerance: Patient limited by pain Patient left: in bed;with call bell/phone within reach;with bed alarm set;with SCD's reapplied;Other (comment) (L heel floating via towel roll; R heel floating via pillow support; polar care in place and activated) Nurse Communication: Mobility status;Patient requests pain meds;Precautions;Weight bearing status PT Visit Diagnosis: Other abnormalities of gait and mobility (R26.89);Muscle weakness (generalized) (M62.81);History of falling (Z91.81);Difficulty in walking, not elsewhere classified (R26.2);Pain Pain - Right/Left: Left Pain - part of body: Knee     Time: 1353-1440 PT Time Calculation (min) (ACUTE ONLY): 47 min  Charges:   $Therapeutic Exercise: 8-22 mins $Therapeutic Activity: 23-37 mins                    Leitha Bleak, PT 03/10/21, 3:14 PM

## 2021-03-10 NOTE — Progress Notes (Signed)
   Subjective: 1 Day Post-Op Procedure(s) (LRB): TOTAL KNEE ARTHROPLASTY - Rachelle Hora to Assist (Left) Patient reports pain as moderate.   Patient is well, and has had no acute complaints or problems Denies any CP, SOB, ABD pain. We will continue therapy today.   Objective: Vital signs in last 24 hours: Temp:  [97.5 F (36.4 C)-98.9 F (37.2 C)] 98.4 F (36.9 C) (03/16 0754) Pulse Rate:  [73-98] 88 (03/16 0754) Resp:  [15-25] 18 (03/16 0754) BP: (119-166)/(70-84) 159/81 (03/16 0754) SpO2:  [89 %-99 %] 96 % (03/16 0754)  Intake/Output from previous day: 03/15 0701 - 03/16 0700 In: 1350 [P.O.:100; I.V.:1200; IV Piggyback:50] Out: 2015 [Urine:1915; Blood:100] Intake/Output this shift: No intake/output data recorded.  Recent Labs    03/09/21 1116 03/10/21 0645  HGB 12.3 12.4   Recent Labs    03/09/21 1116 03/10/21 0645  WBC 15.7* 16.7*  RBC 4.09 4.13  HCT 38.0 38.4  PLT 337 280   Recent Labs    03/09/21 1116 03/10/21 0645  NA  --  136  K  --  4.3  CL  --  105  CO2  --  24  BUN  --  14  CREATININE 0.79 0.66  GLUCOSE  --  181*  CALCIUM  --  8.5*   No results for input(s): LABPT, INR in the last 72 hours.  EXAM General - Patient is Alert, Appropriate and Oriented Extremity - Neurovascular intact Sensation intact distally Intact pulses distally Dorsiflexion/Plantar flexion intact No cellulitis present Compartment soft Dressing - dressing C/D/I and no drainage, provena intact with out drainage Motor Function - intact, moving foot and toes well on exam.   Past Medical History:  Diagnosis Date  . Abnormal uterine bleeding   . Anemia   . Anxiety   . Arthritis    NECK  . Bell's palsy    right side  . Bipolar 1 disorder ([place])   . Brittle bones   . Complication of anesthesia    had to come back to ER after shoulder scope block breathing diffuculty  . COPD (chronic obstructive pulmonary disease) ([place])   . Depression   . Diabetes mellitus without  complication ([place])   . GERD (gastroesophageal reflux disease)   . Heart murmur   . Hepatitis 2016   c: treated by Dr. Allen Norris (harvoni)  . Hypercholesteremia   . Hypothyroidism   . Meningitis 01/20/2017  . Migraine headache    after meningitis.  none since starting nortriptyline  . Neck pain   . Neck pain, chronic   . Panic attack   . Sciatica   . Shortness of breath dyspnea   . Thyroid disease   . Wears dentures    full upper    Assessment/Plan:   1 Day Post-Op Procedure(s) (LRB): TOTAL KNEE ARTHROPLASTY - Rachelle Hora to Assist (Left) Active Problems:   Osteoarthritis of left knee   S/P TKR (total knee replacement) using cement, left  Estimated body mass index is 38.01 kg/m as calculated from the following:   Height as of 03/01/21: 5\' 8"  (1.727 m).   Weight as of 03/01/21: 113.4 kg. Advance diet Up with therapy  Labs and VSS Continue with current pain regimen CM to assist with discharge   DVT Prophylaxis - Lovenox, TED hose and SCDs Weight-Bearing as tolerated to left leg   T. Rachelle Hora, PA-C [place] 03/10/2021, 9:41 AM
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
## 2021-03-11 ENCOUNTER — Inpatient Hospital Stay: Payer: Medicaid Other

## 2021-03-11 LAB — GLUCOSE, CAPILLARY
Glucose-Capillary: 200 mg/dL — ABNORMAL HIGH (ref 70–99)
Glucose-Capillary: 235 mg/dL — ABNORMAL HIGH (ref 70–99)
Glucose-Capillary: 213 mg/dL — ABNORMAL HIGH (ref 70–99)
Glucose-Capillary: 211 mg/dL — ABNORMAL HIGH (ref 70–99)

## 2021-03-11 LAB — SARS CORONAVIRUS 2 (TAT 6-24 HRS): SARS Coronavirus 2: NEGATIVE

## 2021-03-11 MED ORDER — TRAMADOL HCL 50 MG PO TABS: 50.0000 mg | ORAL_TABLET | Freq: Four times a day (QID) | ORAL | 0 refills | Status: AC

## 2021-03-11 MED ORDER — ENOXAPARIN SODIUM 40 MG/0.4ML ~~LOC~~ SOLN: 40.0000 mg | SUBCUTANEOUS | 0 refills | Status: DC

## 2021-03-11 MED ORDER — METHOCARBAMOL 500 MG PO TABS: 500.0000 mg | ORAL_TABLET | Freq: Four times a day (QID) | ORAL | 0 refills | Status: DC | PRN

## 2021-03-11 MED ORDER — OXYCODONE HCL 10 MG PO TABS: 10.0000 mg | ORAL_TABLET | ORAL | 0 refills | Status: DC | PRN

## 2021-03-11 NOTE — Progress Notes (Signed)
Subjective: 2 Days Post-Op Procedure(s) (LRB): TOTAL KNEE ARTHROPLASTY - Rachelle Hora to Assist (Left) Patient reports pain as mild and moderate.   Patient is well, and has had no acute complaints or problems Denies any CP, SOB, ABD pain. We will continue therapy today.  Plan is to go to SNF at discharge  Objective: Vital signs in last 24 hours: Temp:  [97.8 F (36.6 C)-98.5 F (36.9 C)] 97.9 F (36.6 C) (03/17 0838) Pulse Rate:  [86-97] 86 (03/17 0838) Resp:  [16-18] 16 (03/17 0838) BP: (125-171)/(65-88) 128/72 (03/17 0838) SpO2:  [94 %-100 %] 95 % (03/17 0838)  Intake/Output from previous day: 03/16 0701 - 03/17 0700 In: 1307.6 [P.O.:720; I.V.:537.6; IV Piggyback:50] Out: 0  Intake/Output this shift: No intake/output data recorded.  Recent Labs    03/09/21 1116 03/10/21 0645  HGB 12.3 12.4   Recent Labs    03/09/21 1116 03/10/21 0645  WBC 15.7* 16.7*  RBC 4.09 4.13  HCT 38.0 38.4  PLT 337 280   Recent Labs    03/09/21 1116 03/10/21 0645  NA  --  136  K  --  4.3  CL  --  105  CO2  --  24  BUN  --  14  CREATININE 0.79 0.66  GLUCOSE  --  181*  CALCIUM  --  8.5*   No results for input(s): LABPT, INR in the last 72 hours.  EXAM General - Patient is Alert, Appropriate and Oriented Extremity - Neurovascular intact Sensation intact distally Intact pulses distally Dorsiflexion/Plantar flexion intact No cellulitis present Compartment soft Dressing - dressing C/D/I and no drainage, provena intact with out drainage Motor Function - intact, moving foot and toes well on exam.   Past Medical History:  Diagnosis Date  . Abnormal uterine bleeding   . Anemia   . Anxiety   . Arthritis    NECK  . Bell's palsy    right side  . Bipolar 1 disorder ([place])   . Brittle bones   . Complication of anesthesia    had to come back to ER after shoulder scope block breathing diffuculty  . COPD (chronic obstructive pulmonary disease) ([place])   . Depression   .  Diabetes mellitus without complication ([place])   . GERD (gastroesophageal reflux disease)   . Heart murmur   . Hepatitis 2016   c: treated by Dr. Allen Norris ([place])  . Hypercholesteremia   . Hypothyroidism   . Meningitis 01/20/2017  . Migraine headache    after meningitis.  none since starting nortriptyline  . Neck pain   . Neck pain, chronic   . Panic attack   . Sciatica   . Shortness of breath dyspnea   . Thyroid disease   . Wears dentures    full upper    Assessment/Plan:   2 Days Post-Op Procedure(s) (LRB): TOTAL KNEE ARTHROPLASTY - Rachelle Hora to Assist (Left) Active Problems:   Osteoarthritis of left knee   S/P TKR (total knee replacement) using cement, left  Estimated body mass index is 38.01 kg/m as calculated from the following:   Height as of 03/01/21: 5\' 8"  (1.727 m).   Weight as of 03/01/21: 113.4 kg. Advance diet Up with therapy  Labs and VSS + BM Continue with current pain regimen CM to assist with discharge to SNF. Patient stable and ready for discharge today to SNF   DVT Prophylaxis - Lovenox, TED hose and SCDs Weight-Bearing as tolerated to left leg   T. Rachelle Hora, PA-C  [place] 03/11/2021, 10:03 AM

## 2021-03-11 NOTE — Progress Notes (Signed)
Occupational Therapy Treatment Patient Details Name: Monica Terry MRN: [id number] DOB: August 11, 1966 Today's Date: 03/11/2021    History of present illness Pt is a 55 y.o. female s/p L TKA secondary primary localized OA of L knee 03/09/2021.  PMH includes COPD, O2 dependent (at night), asthma, anxiety, depression, bipolar disorder, Hepatitis C, DM, valvular (heart) problems/murmurs, meningitis, anterior cervical spine fusion, joint replacement 2009.   OT comments  Upon entering the room, pt supine in bed with 8/10 pain but continues to be motivated for OT intervention. Pt performed supine >EOB with supervision. Pt standing with min guard and taking several steps to sink with RW. Pt standing for several minutes with B UEs unsupported with close supervision while brushing teeth and washing face. Pt returning to sit EOB. OT educated and demonstrated use of LH reacher and sock aid to increase I with LB dressing. Pt needing min cuing for proper technique and able to demonstrate donning and doffing socks this session. Pt remained on EOB and transitioned to PT session. Pt continues to benefit from OT intervention.   Follow Up Recommendations  SNF    Equipment Recommendations  Other (comment) (defer to next venue of care)       Precautions / Restrictions Precautions Precautions: Fall;Knee Precaution Comments: wound vac Restrictions Weight Bearing Restrictions: Yes LLE Weight Bearing: Weight bearing as tolerated       Mobility Bed Mobility Overal bed mobility: Needs Assistance Bed Mobility: Supine to Sit     Supine to sit: HOB elevated;Supervision     General bed mobility comments: Pt used bed rail for assist and min cuing for technique but no physical assistance needed    Transfers Overall transfer level: Needs assistance Equipment used: Rolling walker (2 wheeled) Transfers: Sit to/from Stand Sit to Stand: Min guard         General transfer comment: min cuing for hand  placement and technique    Balance Overall balance assessment: Needs assistance Sitting-balance support: No upper extremity supported;Feet supported Sitting balance-Leahy Scale: Good Sitting balance - Comments: steady no LOB   Standing balance support: During functional activity Standing balance-Leahy Scale: Fair                             ADL either performed or assessed with clinical judgement   ADL Overall ADL's : Needs assistance/impaired     Grooming: Wash/dry face;Supervision/safety;Oral care;Standing               Lower Body Dressing: Set up;Min guard;With adaptive equipment;Cueing for safety;Cueing for sequencing Lower Body Dressing Details (indicate cue type and reason): use of reacher and sock aide                     Vision Baseline Vision/History: Wears glasses Wears Glasses: At all times Patient Visual Report: No change from baseline            Cognition Arousal/Alertness: Awake/alert Behavior During Therapy: Anxious Overall Cognitive Status: History of cognitive impairments - at baseline                                 General Comments: Pt is very pleasant and motivated for therapeutic intervention.                   Pertinent Vitals/ Pain       Pain Assessment: 0-10 Pain Score: 8  Pain Location: L knee Pain Descriptors / Indicators: Aching;Discomfort;Guarding Pain Intervention(s): Limited activity within patient's tolerance;Monitored during session;Premedicated before session;Repositioned         Frequency  Min 2X/week        Progress Toward Goals  OT Goals(current goals can now be found in the care plan section)  Progress towards OT goals: Progressing toward goals  Acute Rehab OT Goals Patient Stated Goal: to improve pain OT Goal Formulation: With patient Time For Goal Achievement: 03/24/21 Potential to Achieve Goals: Good  Plan Discharge plan needs to be updated       AM-PAC OT "6  Clicks" Daily Activity     Outcome Measure   Help from another person eating meals?: None Help from another person taking care of personal grooming?: A Little Help from another person toileting, which includes using toliet, bedpan, or urinal?: A Lot Help from another person bathing (including washing, rinsing, drying)?: A Lot Help from another person to put on and taking off regular upper body clothing?: A Little Help from another person to put on and taking off regular lower body clothing?: A Lot 6 Click Score: 16    End of Session Equipment Utilized During Treatment: Rolling walker  OT Visit Diagnosis: Unsteadiness on feet (R26.81);Pain Pain - Right/Left: Left Pain - part of body: Knee   Activity Tolerance Patient tolerated treatment well   Patient Left in bed;with call bell/phone within reach;with bed alarm set   Nurse Communication Mobility status;Patient requests pain meds        Time: 1002-1026 OT Time Calculation (min): 24 min  Charges: OT General Charges $OT Visit: 1 Visit OT Treatments $Self Care/Home Management : 23-37 mins  Darleen Crocker, MS, OTR/L , CBIS [place] [phone]  03/11/21, 12:42 PM
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
## 2021-03-11 NOTE — Progress Notes (Signed)
Physical Therapy Treatment Patient Details Name: Monica Terry MRN: [id number] DOB: Dec 03, 1966 Today's Date: 03/11/2021    History of Present Illness Pt is a 55 y.o. female s/p L TKA secondary primary localized OA of L knee 03/09/2021.  PMH includes COPD, O2 dependent (at night), asthma, anxiety, depression, bipolar disorder, Hepatitis C, DM, valvular (heart) problems/murmurs, meningitis, anterior cervical spine fusion, joint replacement 2009.    PT Comments    Pt sitting edge of bed (just finished OT session) upon PT arrival.  Pt reporting 8/10 L knee pain (pt pre-medicated for therapy sessions).  CGA with transfers using RW and CGA ambulating 75 feet with RW (1 short standing rest break mid-ambulation d/t L knee pain).  Pt requesting to transfer to bed for L knee flexion AROM in sitting and pt able to get to 75 degrees L knee flexion on own but then pt reporting increased L knee pain.  Pt able to transfer back to recliner with RW and set-up in recliner.  Nurse notified immediately of pt's request for more pain meds d/t 10/10 L knee pain (nurse present end of session and gave pt pain relieving medications).  Will continue to focus on strengthening, knee ROM, and progressive functional mobility per pt tolerance.  Will monitor pt's progress with functional mobiity and pain control for any potential updates to PT recommendations.   Follow Up Recommendations  SNF (pending progress)     Equipment Recommendations  Rolling walker with 5" wheels;3in1 (PT)    Recommendations for Other Services OT consult     Precautions / Restrictions Precautions Precautions: Fall;Knee Precaution Booklet Issued: Yes (comment) Precaution Comments: wound vac Restrictions Weight Bearing Restrictions: Yes LLE Weight Bearing: Weight bearing as tolerated    Mobility  Bed Mobility Overal bed mobility: Needs Assistance Bed Mobility: Supine to Sit     Supine to sit: HOB elevated;Supervision     General bed  mobility comments: Deferred (pt sitting edge of bed upon PT arrival and sitting in recliner end of session)    Transfers Overall transfer level: Needs assistance Equipment used: Rolling walker (2 wheeled) Transfers: Sit to/from [facility] Sit to Stand: Min guard Stand pivot transfers: Min guard (stand step turn recliner to/from bed)       General transfer comment: x2 trials from bed and x1 trial from recliner; intermittent vc's for UE/LE placement and overall technique  Ambulation/Gait Ambulation/Gait assistance: Min guard Gait Distance (Feet): 75 Feet Assistive device: Rolling walker (2 wheeled)   Gait velocity: decreased   General Gait Details: antalgic; decreased stance time L LE; vc's for technique and walker use; 1 short standing rest break   Stairs             Wheelchair Mobility    Modified Rankin (Stroke Patients Only)       Balance Overall balance assessment: Needs assistance Sitting-balance support: No upper extremity supported;Feet supported Sitting balance-Leahy Scale: Good Sitting balance - Comments: steady sitting reaching within BOS   Standing balance support: Bilateral upper extremity supported;During functional activity Standing balance-Leahy Scale: Fair Standing balance comment: pt requiring B UE support for ambulation                            Cognition Arousal/Alertness: Awake/alert Behavior During Therapy: Anxious Overall Cognitive Status: History of cognitive impairments - at baseline  General Comments: 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  Pt agreeable to PT session.      Pertinent Vitals/Pain Pain Assessment:  0-10 Pain Score: 8  Pain Location: L knee Pain Descriptors / Indicators: Aching;Discomfort;Guarding Pain Intervention(s): Limited activity within patient's tolerance;Monitored during session;Premedicated before session;Repositioned  Vitals (HR and O2 on room air) stable and WFL throughout treatment session.    Home Living                      Prior Function            PT Goals (current goals can now be found in the care plan section) Acute Rehab PT Goals Patient Stated Goal: to improve mobility and pain PT Goal Formulation: With patient Time For Goal Achievement: 03/23/21 Potential to Achieve Goals: Good Progress towards PT goals: Progressing toward goals    Frequency    BID      PT Plan Current plan remains appropriate    Co-evaluation              AM-PAC PT "6 Clicks" Mobility   Outcome Measure  Help needed turning from your back to your side while in a flat bed without using bedrails?: A Little Help needed moving from lying on your back to sitting on the side of a flat bed without using bedrails?: A Little Help needed moving to and from a bed to a chair (including a wheelchair)?: A Little Help needed standing up from a chair using your arms (e.g., wheelchair or bedside chair)?: A Little Help needed to walk in hospital room?: A Little Help needed climbing 3-5 steps with a railing? : A Lot 6 Click Score: 17    End of Session Equipment Utilized During Treatment: Gait belt Activity Tolerance: Patient limited by pain Patient left: in chair;with call bell/phone within reach;with chair alarm set;with nursing/sitter in room;with family/visitor present;Other (comment);with SCD's reapplied (L heel floating via towel roll; R heel floating via pillow support; polar care in place and activated) Nurse Communication: Mobility status;Patient requests pain meds;Precautions;Weight bearing status PT Visit Diagnosis: Other abnormalities of gait and mobility  (R26.89);Muscle weakness (generalized) (M62.81);History of falling (Z91.81);Difficulty in walking, not elsewhere classified (R26.2);Pain Pain - Right/Left: Left Pain - part of body: Knee     Time: 6389-3734 PT Time Calculation (min) (ACUTE ONLY): 38 min  Charges:  $Gait Training: 8-22 mins $Therapeutic Exercise: 8-22 mins $Therapeutic Activity: 8-22 mins                    Leitha Bleak, PT 03/11/21, 12:50 PM

## 2021-03-11 NOTE — Progress Notes (Addendum)
Physical Therapy Treatment Patient Details Name: Monica Terry MRN: [id number] DOB: 27-Apr-1966 Today's Date: 03/11/2021    History of Present Illness Pt is a 55 y.o. female s/p L TKA secondary primary localized OA of L knee 03/09/2021.  PMH includes COPD, O2 dependent (at night), asthma, anxiety, depression, bipolar disorder, Hepatitis C, DM, valvular (heart) problems/murmurs, meningitis, anterior cervical spine fusion, joint replacement 2009.    PT Comments    Pt resting in bed upon PT arrival and reporting 8/10 L knee/thigh pain but willing to participate in therapy (pt received pain meds prior to PT session).  Supine to sit SBA (extra time for pt to perform on own d/t L knee/thigh pain); CGA with transfers with RW use (occasional vc's for UE/LE placement); and CGA ambulating up to 75 feet with RW (limited distance ambulating d/t L knee/thigh pain).  Pain 8/10 end of session L knee/thigh (nurse notified of pt's pain and also pt's L thigh pain/swelling--nurse reporting she will notify PA).  Pt reports plan to discharge home with assist from her sister and boyfriend; pt also reports she will stay on main level (plans to sleep in recliner) so she will not have any stairs to navigate (has ramp to enter home).  Will continue to progress pt with strengthening, knee ROM, and progressive functional mobility per pt tolerance.    Follow Up Recommendations  SNF (pending progress)     Equipment Recommendations  Rolling walker with 5" wheels;3in1 (PT)    Recommendations for Other Services OT consult     Precautions / Restrictions Precautions Precautions: Fall;Knee Precaution Booklet Issued: Yes (comment) Precaution Comments: wound vac Restrictions Weight Bearing Restrictions: Yes LLE Weight Bearing: Weight bearing as tolerated    Mobility  Bed Mobility Overal bed mobility: Needs Assistance Bed Mobility: Supine to Sit;Sit to Supine     Supine to sit: HOB elevated;Supervision (increased  time to perform on own; use of R LE to assist with L LE) Sit to supine: Min assist;HOB elevated (assist for L LE)   General bed mobility comments: increased time for pt to perform due to L knee/thigh pain    Transfers Overall transfer level: Needs assistance Equipment used: Rolling walker (2 wheeled) Transfers: Sit to/from [facility] Sit to Stand: Min guard (x1 trial from bed; x1 trial from toilet; x1 trial from recliner) Stand pivot transfers: Min guard (stand step turn to toilet with RW)       General transfer comment: occasional vc's for UE/LE placement  Ambulation/Gait Ambulation/Gait assistance: Min guard Gait Distance (Feet): 75 Feet (20 feet to bathroom with RW) Assistive device: Rolling walker (2 wheeled)   Gait velocity: decreased   General Gait Details: antalgic; decreased stance time L LE; occasional vc's for technique and walker use   Stairs             Wheelchair Mobility    Modified Rankin (Stroke Patients Only)       Balance Overall balance assessment: Needs assistance Sitting-balance support: No upper extremity supported;Feet supported Sitting balance-Leahy Scale: Good Sitting balance - Comments: steady sitting reaching within BOS   Standing balance support: Single extremity supported Standing balance-Leahy Scale: Good Standing balance comment: pt requiring at least single UE support for standing balance                            Cognition Arousal/Alertness: Awake/alert Behavior During Therapy: Anxious Overall Cognitive Status: History of cognitive impairments - at baseline  General Comments: 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    Home Living                      Prior Function            PT Goals (current goals can now be found in the care plan section) Acute Rehab PT Goals Patient Stated Goal: to improve mobility and pain PT Goal Formulation: With patient Time For Goal Achievement: 03/23/21 Potential to Achieve Goals: Good Progress towards PT goals: Progressing toward goals    Frequency    BID      PT Plan Current plan remains appropriate    Co-evaluation              AM-PAC PT "6 Clicks" Mobility   Outcome Measure  Help needed turning from your back to your side while in a flat bed without using bedrails?: A Little Help needed moving from lying on your back to sitting on the side of a flat bed without using bedrails?: A Little Help needed moving to and from a bed to a chair (including a wheelchair)?: A Little Help needed standing up from a chair using your arms (e.g., wheelchair or bedside chair)?: A Little Help needed to walk in hospital room?: A Little Help needed climbing 3-5 steps with a railing? : A Little 6 Click Score: 18    End of Session Equipment Utilized During Treatment: Gait belt Activity Tolerance: Patient limited by pain Patient left: in bed;with call bell/phone within reach;with bed alarm set;with SCD's reapplied;Other (comment) (L heel floating via towel roll; R heel floating via pillow; polar care in place and activated) Nurse Communication: Mobility status;Precautions;Weight bearing status;Other (comment) (Pt's L thigh painful and swollen; pt's pain level) PT Visit Diagnosis: Other abnormalities of gait and mobility  (R26.89);Muscle weakness (generalized) (M62.81);History of falling (Z91.81);Difficulty in walking, not elsewhere classified (R26.2);Pain Pain - Right/Left: Left Pain - part of body: Knee     Time: 4656-8127 PT Time Calculation (min) (ACUTE ONLY): 31 min  Charges:  $Gait Training: 8-22 mins $Therapeutic Activity: 8-22 mins                    Leitha Bleak, PT 03/11/21, 2:58 PM

## 2021-03-11 NOTE — Discharge Instructions (Signed)

## 2021-03-11 NOTE — TOC Progression Note (Signed)
Transition of Care [facility]) - Progression Note    Patient Details  Name: Monica Terry MRN: [id number] Date of Birth: 11-27-1966  Transition of Care [facility]) CM/SW [place], RN Phone Number: 03/11/2021, 1:03 PM  Clinical Narrative:   TOC in to see patient at bedside.  Discussed Medicaid SNF convalescent requirements with patient, who verbalized she now wants to go home with home health.  Notified nurse, Dr. Rudene Christians and therapy team of this change.  Will connect with home health.  TOC will follow through discharge.         Expected Discharge Plan and Services                                                 Social Determinants of Health (SDOH) Interventions    Readmission Risk Interventions No flowsheet data found.

## 2021-03-12 LAB — GLUCOSE, CAPILLARY
Glucose-Capillary: 172 mg/dL — ABNORMAL HIGH (ref 70–99)
Glucose-Capillary: 203 mg/dL — ABNORMAL HIGH (ref 70–99)

## 2021-03-12 NOTE — Progress Notes (Signed)
Subjective: 3 Days Post-Op Procedure(s) (LRB): TOTAL KNEE ARTHROPLASTY - Rachelle Hora to Assist (Left) Patient reports pain as mild and moderate.   Patient is well, and has had no acute complaints or problems Denies any CP, SOB, ABD pain. We will continue therapy today.  Plan is to go to home  Objective: Vital signs in last 24 hours: Temp:  [97.8 F (36.6 C)-98.5 F (36.9 C)] 98 F (36.7 C) (03/18 0438) Pulse Rate:  [86-97] 92 (03/18 0438) Resp:  [16-17] 16 (03/18 0438) BP: (110-145)/(59-99) 123/59 (03/18 0438) SpO2:  [94 %-99 %] 97 % (03/18 0438)  Intake/Output from previous day: 03/17 0701 - 03/18 0700 In: 120 [P.O.:120] Out: -  Intake/Output this shift: No intake/output data recorded.  Recent Labs    03/09/21 1116 03/10/21 0645  HGB 12.3 12.4   Recent Labs    03/09/21 1116 03/10/21 0645  WBC 15.7* 16.7*  RBC 4.09 4.13  HCT 38.0 38.4  PLT 337 280   Recent Labs    03/09/21 1116 03/10/21 0645  NA  --  136  K  --  4.3  CL  --  105  CO2  --  24  BUN  --  14  CREATININE 0.79 0.66  GLUCOSE  --  181*  CALCIUM  --  8.5*   No results for input(s): LABPT, INR in the last 72 hours.  EXAM General - Patient is Alert, Appropriate and Oriented Extremity - Neurovascular intact Sensation intact distally Intact pulses distally Dorsiflexion/Plantar flexion intact No cellulitis present Compartment soft Dressing - dressing C/D/I and no drainage, provena intact with out drainage Motor Function - intact, moving foot and toes well on exam.   Past Medical History:  Diagnosis Date  . Abnormal uterine bleeding   . Anemia   . Anxiety   . Arthritis    NECK  . Bell's palsy    right side  . Bipolar 1 disorder ([place])   . Brittle bones   . Complication of anesthesia    had to come back to ER after shoulder scope block breathing diffuculty  . COPD (chronic obstructive pulmonary disease) ([place])   . Depression   . Diabetes mellitus without complication ([place])   .  GERD (gastroesophageal reflux disease)   . Heart murmur   . Hepatitis 2016   c: treated by Dr. Allen Norris ([place])  . Hypercholesteremia   . Hypothyroidism   . Meningitis 01/20/2017  . Migraine headache    after meningitis.  none since starting nortriptyline  . Neck pain   . Neck pain, chronic   . Panic attack   . Sciatica   . Shortness of breath dyspnea   . Thyroid disease   . Wears dentures    full upper    Assessment/Plan:   3 Days Post-Op Procedure(s) (LRB): TOTAL KNEE ARTHROPLASTY - Rachelle Hora to Assist (Left) Active Problems:   Osteoarthritis of left knee   S/P TKR (total knee replacement) using cement, left  Estimated body mass index is 38.01 kg/m as calculated from the following:   Height as of 03/01/21: 5\' 8"  (1.727 m).   Weight as of 03/01/21: 113.4 kg. Advance diet Up with therapy  Labs and VSS Pain well controlled Good progress of physical therapy yesterday.  Recommend home with home health PT. Discharge home with home health PT today pending completion of PT goals  DVT Prophylaxis - Lovenox, TED hose and SCDs Weight-Bearing as tolerated to left leg   T. Rachelle Hora, PA-C [facility] 03/12/2021, 7:45 AM

## 2021-03-12 NOTE — TOC Transition Note (Addendum)
Transition of Care [facility]) - CM/SW Discharge Note   Patient Details  Name: Monica Terry MRN: [id number] Date of Birth: May 09, 1966  [facility] CM/SW Contact:  Magnus Ivan, LCSW Phone Number: 03/12/2021, 1:30 PM   Clinical Narrative:  Patient has orders to DC home today. CSW met with patient. Family at bedside. Patient lives alone. PCP is Dr. Carrie Mew.  Pharmacy is [facility]. No DME. Patient agreeable to 3 in 1 and RW being ordered, referral made to [facility] and DME will be delivered to bedside prior to DC. [facility] was arranged by [employer], patient reported she is agreeable to this. CSW notified [facility] of DC. Patient says she has a ride home. No other TOC needs identified prior to DC.    Final next level of care: [place] Barriers to Discharge: Barriers Resolved   Patient Goals and CMS Choice Patient states their goals for this hospitalization and ongoing recovery are:: home with home health CMS Medicare.gov Compare Post Acute Care list provided to:: Patient Choice offered to / list presented to : Patient  Discharge Placement                Patient to be transferred to facility by: family Name of family member notified: patient notified Patient and family notified of of transfer: 03/12/21  Discharge Plan and Services                DME Arranged: Gilford Rile rolling,3-N-1 DME Agency: [facility] Date DME Agency Contacted: 03/12/21   Representative spoke with at DME Agency: [place]: [facility] Date [place]: 03/12/21   Representative spoke with at [place]: [place] ([place]) Interventions     Readmission Risk Interventions No flowsheet data found.

## 2021-03-12 NOTE — Progress Notes (Signed)
Physical Therapy Treatment Patient Details Name: Monica Terry MRN: [id number] DOB: 1966/08/05 Today's Date: 03/12/2021    History of Present Illness Pt is a 55 y.o. female s/p L TKA secondary primary localized OA of L knee 03/09/2021.  PMH includes COPD, O2 dependent (at night), asthma, anxiety, depression, bipolar disorder, Hepatitis C, DM, valvular (heart) problems/murmurs, meningitis, anterior cervical spine fusion, joint replacement 2009.    PT Comments    Pt resting in bed upon PT arrival and agreeable to PT session; pt reports already having pain meds today (8/10 L knee pain currently) and looking forward to going home today.  SBA with bed mobility; SBA with transfers using RW; CGA to SBA ambulating 150 feet with RW.  Overall pt steady and safe with functional mobility during sessions activities with RW use.  L knee flexion improved to 80 degrees AROM today but limited d/t L knee pain (9/10 end of session at rest; nurse notified of pt's request for pain meds).  Pt reporting she plans to sleep in recliner at home and does NOT have any steps she needs to navigate into or within home.  Pt also reports her sister and boyfriend will be able to provide any needed assist at home.  Pt appearing excited to go home today and did fairly well tolerating sessions activities.  Educated pt on safe car transfer technique: pt verbalizing appropriate understanding.  Discharge recommendations updated to HHPT with 24/7 assist.  TOC notified of pt's need for RW and BSC.   Follow Up Recommendations  Home health PT;Supervision/Assistance - 24 hour     Equipment Recommendations  Rolling walker with 5" wheels;3in1 (PT)    Recommendations for Other Services OT consult     Precautions / Restrictions Precautions Precautions: Fall;Knee Precaution Booklet Issued: Yes (comment) Precaution Comments: wound vac Restrictions Weight Bearing Restrictions: Yes LLE Weight Bearing: Weight bearing as tolerated     Mobility  Bed Mobility Overal bed mobility: Needs Assistance Bed Mobility: Supine to Sit;Sit to Supine     Supine to sit: HOB elevated;Supervision Sit to supine: HOB elevated;Supervision   General bed mobility comments: increased time for pt to perform on own due to L knee/thigh pain; use of R LE to assist L LE    Transfers Overall transfer level: Needs assistance Equipment used: Rolling walker (2 wheeled) Transfers: Sit to/from [facility] Sit to Stand: Supervision Stand pivot transfers: Supervision       General transfer comment: steady safe transfers; mild increased effort to stand  Ambulation/Gait Ambulation/Gait assistance: Min guard;Supervision Gait Distance (Feet): 150 Feet (15 feet to bathroom) Assistive device: Rolling walker (2 wheeled)   Gait velocity: decreased   General Gait Details: antalgic; decreased stance time L LE; steady with RW; partial step through gait pattern   Stairs             Wheelchair Mobility    Modified Rankin (Stroke Patients Only)       Balance Overall balance assessment: Needs assistance Sitting-balance support: No upper extremity supported;Feet supported Sitting balance-Leahy Scale: Good Sitting balance - Comments: steady sitting reaching within BOS   Standing balance support: No upper extremity supported Standing balance-Leahy Scale: Good Standing balance comment: steady standing washing hands at sink                            Cognition Arousal/Alertness: Awake/alert Behavior During Therapy: Anxious Overall Cognitive Status: History of cognitive impairments - at baseline  General Comments: Pt reports h/o STM impairment      Exercises Total Joint Exercises Ankle Circles/Pumps: AROM;Strengthening;Both;10 reps;Supine Quad Sets: AROM;Strengthening;Both;10 reps;Supine Short Arc Quad: AAROM;Strengthening;Left;10 reps;Supine Heel Slides:  AAROM;Strengthening;Left;Supine Hip ABduction/ADduction: AAROM;Strengthening;Left;10 reps;Supine Straight Leg Raises: AROM;Strengthening;Left;10 reps;Supine Long Arc Quad: AAROM;Strengthening;Left;10 reps;Seated Goniometric ROM: L knee extension 10 degrees short of neutral; L knee flexion AROM 80 degrees sitting edge of bed    General Comments General comments (skin integrity, edema, etc.): L knee wound vac in place.  Nursing cleared pt for participation in physical therapy.  Pt agreeable to PT session.      Pertinent Vitals/Pain Pain Assessment: 0-10 Pain Score: 9  Pain Location: L knee Pain Descriptors / Indicators: Aching;Discomfort;Guarding Pain Intervention(s): Limited activity within patient's tolerance;Monitored during session;Premedicated before session;Repositioned;Patient requesting pain meds-RN notified;Other (comment) (polar care applied and activated)  Vitals (HR and O2 on room air) stable and WFL throughout treatment session.    Home Living                      Prior Function            PT Goals (current goals can now be found in the care plan section) Acute Rehab PT Goals Patient Stated Goal: to improve mobility and pain PT Goal Formulation: With patient Time For Goal Achievement: 03/23/21 Potential to Achieve Goals: Good Progress towards PT goals: Progressing toward goals    Frequency    BID      PT Plan Current plan remains appropriate    Co-evaluation              AM-PAC PT "6 Clicks" Mobility   Outcome Measure  Help needed turning from your back to your side while in a flat bed without using bedrails?: None Help needed moving from lying on your back to sitting on the side of a flat bed without using bedrails?: None Help needed moving to and from a bed to a chair (including a wheelchair)?: A Little Help needed standing up from a chair using your arms (e.g., wheelchair or bedside chair)?: A Little Help needed to walk in hospital  room?: A Little Help needed climbing 3-5 steps with a railing? : A Little 6 Click Score: 20    End of Session Equipment Utilized During Treatment: Gait belt Activity Tolerance: Patient tolerated treatment well;Patient limited by pain Patient left: in bed;with call bell/phone within reach;with bed alarm set;with family/visitor present;with SCD's reapplied;Other (comment) (L heel floating via towel roll; R heel floating via pillow support; polar care in place and activated) Nurse Communication: Mobility status;Patient requests pain meds;Precautions;Weight bearing status PT Visit Diagnosis: Other abnormalities of gait and mobility (R26.89);Muscle weakness (generalized) (M62.81);History of falling (Z91.81);Difficulty in walking, not elsewhere classified (R26.2);Pain Pain - Right/Left: Left Pain - part of body: Knee     Time: 7517-0017 PT Time Calculation (min) (ACUTE ONLY): 44 min  Charges:  $Gait Training: 8-22 mins $Therapeutic Exercise: 8-22 mins $Therapeutic Activity: 8-22 mins                    Leitha Bleak, PT 03/12/21, 11:17 AM

## 2021-03-12 NOTE — Plan of Care (Signed)

## 2021-03-12 NOTE — Plan of Care (Signed)
Pt discharged home per order.All discharged instructions/education and medications reviewed with patient and expressed understanding.Follow up appointments also given to patient.no verbal c/o or any ssx of distress.wound dressing/portable suction device intact and secured.Pt transported by boyfriend in a private car.

## 2021-03-18 ENCOUNTER — Encounter: Payer: Self-pay | Admitting: Emergency Medicine

## 2021-03-18 ENCOUNTER — Emergency Department: Payer: Medicaid Other

## 2021-03-18 ENCOUNTER — Emergency Department
Admission: EM | Admit: 2021-03-18 | Discharge: 2021-03-18 | Disposition: A | Discharge status: Home / Self Care | Payer: Medicaid Other | Attending: Emergency Medicine | Admitting: Emergency Medicine

## 2021-03-18 ENCOUNTER — Other Ambulatory Visit: Payer: Self-pay

## 2021-03-18 DIAGNOSIS — R531 Weakness: Secondary | ICD-10-CM | POA: Insufficient documentation

## 2021-03-18 DIAGNOSIS — M545 Low back pain, unspecified: Secondary | ICD-10-CM | POA: Diagnosis present

## 2021-03-18 DIAGNOSIS — J449 Chronic obstructive pulmonary disease, unspecified: Secondary | ICD-10-CM | POA: Insufficient documentation

## 2021-03-18 DIAGNOSIS — R42 Dizziness and giddiness: Secondary | ICD-10-CM | POA: Diagnosis not present

## 2021-03-18 DIAGNOSIS — Z96652 Presence of left artificial knee joint: Secondary | ICD-10-CM | POA: Diagnosis not present

## 2021-03-18 DIAGNOSIS — N132 Hydronephrosis with renal and ureteral calculous obstruction: Secondary | ICD-10-CM | POA: Insufficient documentation

## 2021-03-18 DIAGNOSIS — E119 Type 2 diabetes mellitus without complications: Secondary | ICD-10-CM | POA: Insufficient documentation

## 2021-03-18 DIAGNOSIS — K5900 Constipation, unspecified: Secondary | ICD-10-CM | POA: Insufficient documentation

## 2021-03-18 DIAGNOSIS — Z7984 Long term (current) use of oral hypoglycemic drugs: Secondary | ICD-10-CM | POA: Insufficient documentation

## 2021-03-18 DIAGNOSIS — Z79899 Other long term (current) drug therapy: Secondary | ICD-10-CM | POA: Insufficient documentation

## 2021-03-18 DIAGNOSIS — Z87891 Personal history of nicotine dependence: Secondary | ICD-10-CM | POA: Diagnosis not present

## 2021-03-18 DIAGNOSIS — E039 Hypothyroidism, unspecified: Secondary | ICD-10-CM | POA: Diagnosis not present

## 2021-03-18 DIAGNOSIS — N200 Calculus of kidney: Secondary | ICD-10-CM

## 2021-03-18 LAB — URINALYSIS, COMPLETE (UACMP) WITH MICROSCOPIC
Bilirubin Urine: NEGATIVE
Glucose, UA: 50 mg/dL — AB
Ketones, ur: NEGATIVE mg/dL
Leukocytes,Ua: NEGATIVE
Nitrite: NEGATIVE
Protein, ur: 30 mg/dL — AB
RBC / HPF: >50 RBC/hpf — ABNORMAL HIGH (ref 0–5)
Specific Gravity, Urine: 1.02 (ref 1.005–1.030)
pH: 9 — ABNORMAL HIGH (ref 5.0–8.0)

## 2021-03-18 LAB — CBC WITH DIFFERENTIAL/PLATELET
Abs Immature Granulocytes: 0.11 10*3/uL — ABNORMAL HIGH (ref 0.00–0.07)
Basophils Absolute: 0.1 10*3/uL (ref 0.0–0.1)
Basophils Relative: 1 %
Eosinophils Absolute: 0.3 10*3/uL (ref 0.0–0.5)
Eosinophils Relative: 2 %
HCT: 43.4 % (ref 36.0–46.0)
Hemoglobin: 14.3 g/dL (ref 12.0–15.0)
Immature Granulocytes: 1 %
Lymphocytes Relative: 18 %
Lymphs Abs: 3.2 10*3/uL (ref 0.7–4.0)
MCH: 29.7 pg (ref 26.0–34.0)
MCHC: 32.9 g/dL (ref 30.0–36.0)
MCV: 90.2 fL (ref 80.0–100.0)
Monocytes Absolute: 0.7 10*3/uL (ref 0.1–1.0)
Monocytes Relative: 4 %
Neutro Abs: 13.3 10*3/uL — ABNORMAL HIGH (ref 1.7–7.7)
Neutrophils Relative %: 74 %
Platelets: 608 10*3/uL — ABNORMAL HIGH (ref 150–400)
RBC: 4.81 MIL/uL (ref 3.87–5.11)
RDW: 13.9 % (ref 11.5–15.5)
WBC: 17.7 10*3/uL — ABNORMAL HIGH (ref 4.0–10.5)
nRBC: 0 % (ref 0.0–0.2)

## 2021-03-18 LAB — COMPREHENSIVE METABOLIC PANEL
ALT: 38 U/L (ref 0–44)
AST: 62 U/L — ABNORMAL HIGH (ref 15–41)
Albumin: 4 g/dL (ref 3.5–5.0)
Alkaline Phosphatase: 121 U/L (ref 38–126)
Anion gap: 13 (ref 5–15)
BUN: 16 mg/dL (ref 6–20)
CO2: 27 mmol/L (ref 22–32)
Calcium: 9.6 mg/dL (ref 8.9–10.3)
Chloride: 96 mmol/L — ABNORMAL LOW (ref 98–111)
Creatinine, Ser: 0.95 mg/dL (ref 0.44–1.00)
GFR, Estimated: >60 mL/min (ref >60)
Glucose, Bld: 185 mg/dL — ABNORMAL HIGH (ref 70–99)
Potassium: 4 mmol/L (ref 3.5–5.1)
Sodium: 136 mmol/L (ref 135–145)
Total Bilirubin: 0.5 mg/dL (ref 0.3–1.2)
Total Protein: 8.2 g/dL — ABNORMAL HIGH (ref 6.5–8.1)

## 2021-03-18 LAB — PROTIME-INR
INR: 0.9 (ref 0.8–1.2)
Prothrombin Time: 12.2 seconds (ref 11.4–15.2)

## 2021-03-18 LAB — LACTIC ACID, PLASMA
Lactic Acid, Venous: 3.3 mmol/L (ref 0.5–1.9)
Lactic Acid, Venous: 2.1 mmol/L (ref 0.5–1.9)

## 2021-03-18 MED ORDER — MORPHINE SULFATE (PF) 4 MG/ML IV SOLN
4.0000 mg | Freq: Once | INTRAVENOUS | Status: AC
  Administered 2021-03-18: 4 mg via INTRAVENOUS
  Filled 2021-03-18: qty 1

## 2021-03-18 MED ORDER — SODIUM CHLORIDE 0.9 % IV BOLUS
1000.0000 mL | Freq: Once | INTRAVENOUS | Status: AC
  Administered 2021-03-18: 1000 mL via INTRAVENOUS

## 2021-03-18 MED ORDER — ONDANSETRON HCL 4 MG/2ML IJ SOLN
4.0000 mg | Freq: Once | INTRAMUSCULAR | Status: AC
  Administered 2021-03-18: 4 mg via INTRAVENOUS
  Filled 2021-03-18: qty 2

## 2021-03-18 MED ORDER — ONDANSETRON HCL 4 MG PO TABS: 4.0000 mg | ORAL_TABLET | Freq: Every day | ORAL | 0 refills | Status: AC | PRN

## 2021-03-18 MED ORDER — TAMSULOSIN HCL 0.4 MG PO CAPS: 0.4000 mg | ORAL_CAPSULE | Freq: Every day | ORAL | 0 refills | Status: DC
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
## 2021-03-18 NOTE — ED Notes (Signed)
Patient transported to X-ray. XR to return pt to room 18

## 2021-03-18 NOTE — ED Triage Notes (Signed)
Pt states she has back pain and is feeling lightheaded since this am. Pt with recent knee surgery last Tuesday. Pt denies blood in urine. Pt with swollen area in left mid-back. Pt states it is tender to touch. Pt states some nausea, denies diarrhea.

## 2021-03-18 NOTE — ED Triage Notes (Signed)
Presents via EMS from home with right lower back pain states pain started this am  Describes pain as muscle being tight   Had recent knee surgery about 1 week ago

## 2021-03-20 LAB — URINE CULTURE: Culture: 30000 — AB

## 2021-03-23 LAB — CULTURE, BLOOD (ROUTINE X 2)
Culture: NO GROWTH
Culture: NO GROWTH
Special Requests: ADEQUATE
Special Requests: ADEQUATE

## 2021-05-26 ENCOUNTER — Other Ambulatory Visit: Payer: Self-pay

## 2021-05-26 DIAGNOSIS — U071 COVID-19: Secondary | ICD-10-CM | POA: Insufficient documentation

## 2021-05-26 DIAGNOSIS — T7840XA Allergy, unspecified, initial encounter: Secondary | ICD-10-CM | POA: Insufficient documentation

## 2021-05-26 DIAGNOSIS — Z5321 Procedure and treatment not carried out due to patient leaving prior to being seen by health care provider: Secondary | ICD-10-CM | POA: Diagnosis not present

## 2021-05-26 NOTE — ED Triage Notes (Signed)
Pt reports that she tested pos for covid 05/24/21, states double vaxxed and boosted. Pt states that she was prescribed the paxlovid and now is having burning sensation to her skin  With lip swelling and cracking, states that she feels like she has blisters in her mouth.

## 2021-05-27 ENCOUNTER — Emergency Department
Admission: EM | Admit: 2021-05-27 | Discharge: 2021-05-27 | Disposition: A | Discharge status: Home / Self Care | Payer: Medicaid Other | Attending: Emergency Medicine | Admitting: Emergency Medicine

## 2021-05-27 NOTE — ED Notes (Signed)
No answer when called several times from lobby 

## 2021-06-11 ENCOUNTER — Encounter: Payer: Self-pay | Admitting: Urology

## 2021-06-11 ENCOUNTER — Ambulatory Visit: Payer: Medicaid Other | Admitting: Urology

## 2021-06-11 ENCOUNTER — Other Ambulatory Visit: Payer: Self-pay

## 2021-06-11 VITALS — BP 159/92 | HR 83 | Ht 68.0 in | Wt 224.0 lb

## 2021-06-11 DIAGNOSIS — N201 Calculus of ureter: Secondary | ICD-10-CM

## 2021-06-11 DIAGNOSIS — N132 Hydronephrosis with renal and ureteral calculous obstruction: Secondary | ICD-10-CM | POA: Diagnosis not present

## 2021-06-11 DIAGNOSIS — N23 Unspecified renal colic: Secondary | ICD-10-CM

## 2021-06-11 LAB — URINALYSIS, COMPLETE
Bilirubin, UA: NEGATIVE
Ketones, UA: NEGATIVE
Leukocytes,UA: NEGATIVE
Nitrite, UA: NEGATIVE
Protein,UA: NEGATIVE
Specific Gravity, UA: 1.02 (ref 1.005–1.030)
Urobilinogen, Ur: 0.2 mg/dL (ref 0.2–1.0)
pH, UA: 5 (ref 5.0–7.5)

## 2021-06-11 LAB — MICROSCOPIC EXAMINATION: Epithelial Cells (non renal): >10 /hpf — AB (ref 0–10)

## 2021-06-11 NOTE — Progress Notes (Signed)
06/11/2021 12:16 PM   Monica Terry 14-Jun-1966 [id number]  Referring provider: Marinda Elk, MD [facility] [place],  [place] [address]  Chief Complaint  Patient presents with   Nephrolithiasis    HPI: Monica Terry is a 55 y.o. female presents for evaluation of stone disease.  ED visit 03/18/2021 for a 2-day history of intermittent sharp right back pain Severity moderate without identifiable precipitating, aggravating or alleviating factors Radiation to the right side region No fever, chills or voiding symptoms + Nausea without vomiting UA with >50 RBC Stone protocol CT with a 1-2 mm right proximal ureteral calculus with mild to moderate hydronephrosis Pain resolved with parenteral narcotic medication and she was discharged on tamsulosin and an antiemetic with instructions of urology follow-up Her pain had resolved shortly after that visit and she thought she had passed the stone however has continued to have intermittent episodes of right flank pain radiating to the right side She starts the tamsulosin when the symptoms occur   PMH: Past Medical History:  Diagnosis Date   Abnormal uterine bleeding    Anemia    Anxiety    Arthritis    NECK   Bell's palsy    right side   Bipolar 1 disorder ([place])    Brittle bones    Complication of anesthesia    had to come back to ER after shoulder scope block breathing diffuculty   COPD (chronic obstructive pulmonary disease) ([place])    Depression    Diabetes mellitus without complication ([place])    GERD (gastroesophageal reflux disease)    Heart murmur    Hepatitis 2016   c: treated by Dr. Allen Norris Bayard Males)   Hypercholesteremia    Hypothyroidism    Meningitis 01/20/2017   Migraine headache    after meningitis.  none since starting nortriptyline   Neck pain    Neck pain, chronic    Panic attack    Sciatica    Shortness of breath dyspnea    Thyroid disease    Wears dentures    full upper     Surgical History: Past Surgical History:  Procedure Laterality Date   ABDOMINAL HYSTERECTOMY     CARPAL TUNNEL RELEASE Right 11/22/2017   Procedure: CARPAL TUNNEL RELEASE;  Surgeon: Leanor Kail, MD;  Location: [facility];  Service: Orthopedics;  Laterality: Right;   COLONOSCOPY WITH PROPOFOL N/A 03/27/2018   Procedure: COLONOSCOPY WITH PROPOFOL;  Surgeon: Toledo, Benay Pike, MD;  Location: [facility];  Service: Gastroenterology;  Laterality: N/A;   ESOPHAGOGASTRODUODENOSCOPY (EGD) WITH PROPOFOL N/A 03/27/2018   Procedure: ESOPHAGOGASTRODUODENOSCOPY (EGD) WITH PROPOFOL;  Surgeon: Toledo, Benay Pike, MD;  Location: [facility];  Service: Gastroenterology;  Laterality: N/A;   HYSTEROSCOPY WITH D & C N/A 02/08/2016   Procedure: DILATATION AND CURETTAGE /HYSTEROSCOPY;  Surgeon: Brayton Mars, MD;  Location: [facility];  Service: Gynecology;  Laterality: N/A;   KNEE ARTHROSCOPY WITH MEDIAL MENISECTOMY Right 11/27/2020   Procedure: Right arthroscopic partial medial meniscectomy, chondroplasty, and medial tibial subchondroplasty;  Surgeon: Leim Fabry, MD;  Location: [facility];  Service: Orthopedics;  Laterality: Right;   NECK SURGERY  2009   failed fusion   SALPINGOOPHORECTOMY Bilateral 04/11/2016   Procedure: SALPINGO OOPHORECTOMY;  Surgeon: Brayton Mars, MD;  Location: [facility];  Service: Gynecology;  Laterality: Bilateral;   SHOULDER ARTHROSCOPY Right 01/04/2019   Procedure: ARTHROSCOPY SHOULDER WITH ARTHROSCOPIC  DISTAL CLAVICLE EXCISION, SUBACROMIAL AND DECOMPRESSION BICEPS TENOTOMY;  Surgeon: Leim Fabry, MD;  Location: [facility];  Service: Orthopedics;  Laterality: Right;  TODD MUNDY ASSISTING ARTHROCARE WAND FLOW 90 WAND BARREL [place] CHAIR WITH SPYDER SLING AFTER SURGERY Diabetic - injectible   spinal meningitis  12/2016   spleenectomy     TOTAL KNEE ARTHROPLASTY Left 03/09/2021   Procedure: TOTAL KNEE ARTHROPLASTY - Rachelle Hora to Assist;  Surgeon:  Hessie Knows, MD;  Location: [facility];  Service: Orthopedics;  Laterality: Left;   TUBAL LIGATION     VAGINAL HYSTERECTOMY Bilateral 04/11/2016   Procedure: HYSTERECTOMY VAGINAL;  Surgeon: Brayton Mars, MD;  Location: [facility];  Service: Gynecology;  Laterality: Bilateral;    Home Medications:  Allergies as of 06/11/2021   No Known Allergies      Medication List        Accurate as of June 11, 2021 12:16 PM. If you have any questions, ask your nurse or doctor.          STOP taking these medications    calcitonin (salmon) 200 UNIT/ACT nasal spray Commonly known as: MIACALCIN/FORTICAL Stopped by: Abbie Sons, MD   predniSONE 10 MG tablet Commonly known as: DELTASONE Stopped by: Abbie Sons, MD       TAKE these medications    acetaminophen 500 MG tablet Commonly known as: TYLENOL Take 2 tablets (1,000 mg total) by mouth every 8 (eight) hours.   albuterol (2.5 MG/3ML) 0.083% nebulizer solution Commonly known as: PROVENTIL Take 2.5 mg by nebulization every 6 (six) hours as needed for wheezing or shortness of breath.   albuterol 108 (90 Base) MCG/ACT inhaler Commonly known as: VENTOLIN HFA Inhale 2 puffs into the lungs every 4 (four) hours as needed for wheezing or shortness of breath.   benzonatate 100 MG capsule Commonly known as: TESSALON Take 100 mg by mouth 3 (three) times daily as needed for cough.   buPROPion 150 MG 24 hr tablet Commonly known as: WELLBUTRIN XL Take 150 mg daily by mouth.   cetirizine 10 MG tablet Commonly known as: ZYRTEC Take 10 mg by mouth daily.   docusate sodium 100 MG capsule Commonly known as: COLACE Take 100 mg by mouth 2 (two) times daily.   enoxaparin 40 MG/0.4ML injection Commonly known as: Lovenox Inject 0.4 mLs (40 mg total) into the skin daily for 14 days.   ergocalciferol 1.25 MG (50000 UT) capsule Commonly known as: VITAMIN D2 Take 50,000 Units by mouth every Tuesday.   estradiol 1 MG  tablet Commonly known as: ESTRACE Take 1 tablet (1 mg total) by mouth daily.   Fluticasone-Salmeterol 250-50 MCG/DOSE Aepb Commonly known as: ADVAIR Inhale 2 puffs 2 (two) times daily into the lungs.   levothyroxine 200 MCG tablet Commonly known as: SYNTHROID Take 200 mcg by mouth at bedtime.   metFORMIN 500 MG tablet Commonly known as: GLUCOPHAGE Take 500 mg by mouth 2 (two) times daily with a meal.   methocarbamol 500 MG tablet Commonly known as: ROBAXIN Take 1 tablet (500 mg total) by mouth every 6 (six) hours as needed for muscle spasms.   montelukast 10 MG tablet Commonly known as: SINGULAIR Take 10 mg by mouth daily.   multivitamin with minerals Tabs tablet Take 1 tablet by mouth daily.   naloxegol oxalate 12.5 MG Tabs tablet Commonly known as: MOVANTIK Take 12.5 mg by mouth daily.   nortriptyline 10 MG capsule Commonly known as: PAMELOR Take 10 mg by mouth at bedtime.   ondansetron 4 MG disintegrating tablet Commonly known as: Zofran ODT Take 1 tablet (4  mg total) by mouth every 8 (eight) hours as needed for nausea or vomiting.   ondansetron 4 MG tablet Commonly known as: Zofran Take 1 tablet (4 mg total) by mouth daily as needed for nausea or vomiting.   Oxycodone HCl 10 MG Tabs Take 1-1.5 tablets (10-15 mg total) by mouth every 4 (four) hours as needed for severe pain (pain score 7-10).   pregabalin 150 MG capsule Commonly known as: LYRICA Take 150 mg by mouth in the morning, at noon, and at bedtime.   rizatriptan 5 MG tablet Commonly known as: MAXALT Take 5 mg by mouth as needed for migraine. May repeat in 2 hours if needed   simvastatin 40 MG tablet Commonly known as: ZOCOR Take 40 mg by mouth at bedtime.   Spiriva HandiHaler 18 MCG inhalation capsule Generic drug: tiotropium Place 18 mcg into inhaler and inhale daily.   tamsulosin 0.4 MG Caps capsule Commonly known as: FLOMAX Take 1 capsule (0.4 mg total) by mouth daily.   terbinafine 250  MG tablet Commonly known as: LAMISIL Take 250 mg by mouth daily.   Victoza 18 MG/3ML Sopn Generic drug: liraglutide Inject 1.8 mg into the skin every evening.        Allergies: No Known Allergies  Family History: Family History  Problem Relation Age of Onset   Diabetes Mother    Heart disease Mother    Cervical cancer Sister    Diabetes Maternal Grandmother    Colon cancer Maternal Grandfather    Breast cancer Neg Hx    Ovarian cancer Neg Hx     Social History:  reports that she quit smoking about 3 years ago. Her smoking use included cigarettes. She started smoking about 47 years ago. She has a 4.10 pack-year smoking history. She has never used smokeless tobacco. She reports current alcohol use. She reports that she does not use drugs.   Physical Exam: BP (!) 159/92   Pulse 83   Ht 5\' 8"  (1.727 m)   Wt 224 lb (101.6 kg)   LMP 01/30/2016   BMI 34.06 kg/m   Constitutional:  Alert and oriented, No acute distress. HEENT: [place] AT, moist mucus membranes.  Trachea midline, no masses. Cardiovascular: No clubbing, cyanosis, or edema. Respiratory: Normal respiratory effort, no increased work of breathing. Skin: No rashes, bruises or suspicious lesions. Neurologic: Grossly intact, no focal deficits, moving all 4 extremities. Psychiatric: Normal mood and affect.  Laboratory Data:  Urinalysis Dipstick 3+ glucose Microscopy >10 epis  Pertinent Imaging: CT images were personally reviewed and interpreted  CT Renal Stone Study  Narrative CLINICAL DATA:  Right lower back pain  EXAM: CT ABDOMEN AND PELVIS WITHOUT CONTRAST  TECHNIQUE: Multidetector CT imaging of the abdomen and pelvis was performed following the standard protocol without IV contrast.  COMPARISON:  06/16/2020  FINDINGS: Lower chest: Lung bases are clear. No effusions. Heart is normal size.  Hepatobiliary: Diffuse low-density throughout the liver compatible with fatty infiltration. No focal  abnormality. Gallbladder unremarkable.  Pancreas: No focal abnormality or ductal dilatation.  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  Adrenals/Urinary Tract: Mild to moderate right hydronephrosis due to punctate 1-2 mm proximal right ureteral stone. Punctate nonobstructing 1-2 mm stone in the upper pole of the left kidney. No hydronephrosis on the left. Adrenal glands and urinary bladder unremarkable.  Stomach/Bowel: Normal appendix. Stomach, large and small bowel grossly unremarkable.  Vascular/Lymphatic: Scattered aortic calcifications. No evidence of aneurysm or adenopathy.  Reproductive: Prior hysterectomy.  No adnexal masses.  Other: No free fluid or free air.  Musculoskeletal: No acute bony abnormality.  IMPRESSION: 1-2 mm proximal right ureteral stone with mild to moderate right hydronephrosis.  Punctate nonobstructing left upper pole nephrolithiasis.  Hepatic steatosis.  Scattered aortic atherosclerosis.   Electronically Signed By: Rolm Baptise M.D. On: 03/18/2021 18:36   Assessment & Plan:    1.  Right ureteral calculus Intermittent episodes of right flank pain since initial ED visit 03/18/2021 Based on small stone size it would not be adequately imaged on ultrasound or plain film Recommend scheduling follow-up stone protocol CT to assess if stone still present or passed If stone still present would recommend ureteroscopic removal   Abbie Sons, MD  [facility] [address] [place], [place] [address] [phone]

## 2021-06-12 ENCOUNTER — Encounter: Payer: Self-pay | Admitting: Urology

## 2021-07-06 ENCOUNTER — Other Ambulatory Visit: Payer: Self-pay

## 2021-07-06 ENCOUNTER — Ambulatory Visit
Admission: RE | Admit: 2021-07-06 | Discharge: 2021-07-06 | Disposition: A | Discharge status: Home / Self Care | Payer: Medicaid Other | Source: Ambulatory Visit | Attending: Urology | Admitting: Urology

## 2021-07-06 DIAGNOSIS — N132 Hydronephrosis with renal and ureteral calculous obstruction: Secondary | ICD-10-CM

## 2021-07-06 DIAGNOSIS — N201 Calculus of ureter: Secondary | ICD-10-CM | POA: Diagnosis not present

## 2021-07-06 DIAGNOSIS — N23 Unspecified renal colic: Secondary | ICD-10-CM | POA: Insufficient documentation

## 2021-07-09 ENCOUNTER — Encounter: Payer: Self-pay | Admitting: *Deleted

## 2021-11-16 ENCOUNTER — Other Ambulatory Visit: Payer: Self-pay

## 2021-11-16 ENCOUNTER — Other Ambulatory Visit: Payer: Self-pay | Admitting: Orthopedic Surgery

## 2021-11-16 ENCOUNTER — Encounter
Admission: RE | Admit: 2021-11-16 | Discharge: 2021-11-16 | Disposition: A | Discharge status: Home / Self Care | Payer: Medicaid Other | Source: Ambulatory Visit | Attending: Orthopedic Surgery | Admitting: Orthopedic Surgery

## 2021-11-16 VITALS — BP 129/68 | HR 76 | Temp 98.1°F | Resp 16 | Ht 68.0 in | Wt 228.6 lb

## 2021-11-16 DIAGNOSIS — Z01818 Encounter for other preprocedural examination: Secondary | ICD-10-CM | POA: Diagnosis not present

## 2021-11-16 DIAGNOSIS — E119 Type 2 diabetes mellitus without complications: Secondary | ICD-10-CM | POA: Diagnosis not present

## 2021-11-16 LAB — COMPREHENSIVE METABOLIC PANEL
ALT: 13 U/L (ref 0–44)
AST: 14 U/L — ABNORMAL LOW (ref 15–41)
Albumin: 3.5 g/dL (ref 3.5–5.0)
Alkaline Phosphatase: 80 U/L (ref 38–126)
Anion gap: 7 (ref 5–15)
BUN: 11 mg/dL (ref 6–20)
CO2: 27 mmol/L (ref 22–32)
Calcium: 8.8 mg/dL — ABNORMAL LOW (ref 8.9–10.3)
Chloride: 103 mmol/L (ref 98–111)
Creatinine, Ser: 0.73 mg/dL (ref 0.44–1.00)
GFR, Estimated: >60 mL/min (ref >60)
Glucose, Bld: 91 mg/dL (ref 70–99)
Potassium: 4.1 mmol/L (ref 3.5–5.1)
Sodium: 137 mmol/L (ref 135–145)
Total Bilirubin: 0.5 mg/dL (ref 0.3–1.2)
Total Protein: 7.2 g/dL (ref 6.5–8.1)

## 2021-11-16 LAB — URINALYSIS, ROUTINE W REFLEX MICROSCOPIC
Bilirubin Urine: NEGATIVE
Glucose, UA: NEGATIVE mg/dL
Hgb urine dipstick: NEGATIVE
Ketones, ur: NEGATIVE mg/dL
Leukocytes,Ua: NEGATIVE
Nitrite: NEGATIVE
Protein, ur: NEGATIVE mg/dL
Specific Gravity, Urine: 1.021 (ref 1.005–1.030)
pH: 5 (ref 5.0–8.0)

## 2021-11-16 LAB — TYPE AND SCREEN
ABO/RH(D): O POS
Antibody Screen: NEGATIVE

## 2021-11-16 LAB — HEMOGLOBIN A1C
Hgb A1c MFr Bld: 6.1 % — ABNORMAL HIGH (ref 4.8–5.6)
Mean Plasma Glucose: 128.37 mg/dL

## 2021-11-16 LAB — CBC WITH DIFFERENTIAL/PLATELET
Abs Immature Granulocytes: 0.05 10*3/uL (ref 0.00–0.07)
Basophils Absolute: 0.1 10*3/uL (ref 0.0–0.1)
Basophils Relative: 1 %
Eosinophils Absolute: 0.3 10*3/uL (ref 0.0–0.5)
Eosinophils Relative: 2 %
HCT: 42.1 % (ref 36.0–46.0)
Hemoglobin: 13.6 g/dL (ref 12.0–15.0)
Immature Granulocytes: 0 %
Lymphocytes Relative: 31 %
Lymphs Abs: 4.5 10*3/uL — ABNORMAL HIGH (ref 0.7–4.0)
MCH: 29.4 pg (ref 26.0–34.0)
MCHC: 32.3 g/dL (ref 30.0–36.0)
MCV: 91.1 fL (ref 80.0–100.0)
Monocytes Absolute: 0.7 10*3/uL (ref 0.1–1.0)
Monocytes Relative: 5 %
Neutro Abs: 8.8 10*3/uL — ABNORMAL HIGH (ref 1.7–7.7)
Neutrophils Relative %: 61 %
Platelets: 456 10*3/uL — ABNORMAL HIGH (ref 150–400)
RBC: 4.62 MIL/uL (ref 3.87–5.11)
RDW: 13.5 % (ref 11.5–15.5)
WBC: 14.5 10*3/uL — ABNORMAL HIGH (ref 4.0–10.5)
nRBC: 0 % (ref 0.0–0.2)

## 2021-11-16 LAB — SURGICAL PCR SCREEN
MRSA, PCR: NEGATIVE
Staphylococcus aureus: NEGATIVE

## 2021-11-16 NOTE — Patient Instructions (Addendum)
Your procedure is scheduled on: Tuesday 11/30/21 Report to the Registration Desk on the 1st floor of the [facility]. To find out your arrival time, please call [phone] between 1PM - 3PM on: Monday 11/29/21  REMEMBER: Instructions that are not followed completely may result in serious medical risk, up to and including death; or upon the discretion of your surgeon and anesthesiologist your surgery may need to be rescheduled.  Do not eat food after midnight the night before surgery.  No gum chewing, lozengers or hard candies.  You may however, drink water up to 2 hours before you are scheduled to arrive for your surgery. Do not drink anything within 2 hours of your scheduled arrival time.  TAKE THESE MEDICATIONS THE MORNING OF SURGERY WITH A SIP OF WATER: buPROPion (WELLBUTRIN XL) 150 MG 24 hr tablet   Hold metFORMIN (GLUCOPHAGE) 500 MG tablet for 2 days prior to surgery.  Use albuterol (VENTOLIN HFA) 108 (90 Base) MCG/ACT inhaler, Fluticasone-Salmeterol (ADVAIR) 250-50 MCG/DOSE AEPB and  tiotropium (SPIRIVA HANDIHALER) 18 MCG inhalation capsule inhalers on the day of surgery and bring to the hospital.  One week prior to surgery: Stop taking your  meloxicam (MOBIC) 15 MG tablet and any other Anti-inflammatories (NSAIDS) such as Advil, Aleve, Ibuprofen, Motrin, Naproxen, Naprosyn and Aspirin based products such as Excedrin, Goodys Powder, BC Powder.  Stop taking your Multiple Vitamin (MULTIVITAMIN WITH MINERALS) TABS tablet and ANY OVER THE COUNTER supplements until after surgery. You may however, continue to take Tylenol if needed for pain up until the day of surgery.  No Alcohol for 24 hours before or after surgery.  No Smoking including e-cigarettes for 24 hours prior to surgery.  No chewable tobacco products for at least 6 hours prior to surgery.  No nicotine patches on the day of surgery.  Do not use any "recreational" drugs for at least a week prior to your surgery.  Please  be advised that the combination of cocaine and anesthesia may have negative outcomes, up to and including death. If you test positive for cocaine, your surgery will be cancelled.  On the morning of surgery brush your teeth with toothpaste and water, you may rinse your mouth with mouthwash if you wish. Do not swallow any toothpaste or mouthwash.  Use CHG Soap as directed on instruction sheet.  Do not wear jewelry, make-up, hairpins, clips or nail polish.  Do not wear lotions, powders, or perfumes.   Do not shave body from the neck down 48 hours prior to surgery just in case you cut yourself which could leave a site for infection.  Also, freshly shaved skin may become irritated if using the CHG soap.  Dentures may not be worn into surgery.  Do not bring valuables to the hospital. [facility] is not responsible for any missing/lost belongings or valuables.   Notify your doctor if there is any change in your medical condition (cold, fever, infection).  Wear comfortable clothing (specific to your surgery type) to the hospital.  After surgery, you can help prevent lung complications by doing breathing exercises.  Take deep breaths and cough every 1-2 hours. Your doctor may order a device called an Incentive Spirometer to help you take deep breaths.  If you are being admitted to the hospital overnight, leave your suitcase in the car. After surgery it may be brought to your room.  If you are taking public transportation, you will need to have a responsible adult (18 years or older) with you. Please confirm  with your physician that it is acceptable to use public transportation.   Please call the [facility] at [phone] if you have any questions about these instructions.  Surgery Visitation Policy:  Patients undergoing a surgery or procedure may have one family member or support person with them as long as that person is not COVID-19 positive or experiencing its  symptoms.  That person may remain in the waiting area during the procedure and may rotate out with other people.  Inpatient Visitation:    Visiting hours are 7 a.m. to 8 p.m. Up to two visitors ages 16+ are allowed at one time in a patient room. The visitors may rotate out with other people during the day. Visitors must check out when they leave, or other visitors will not be allowed. One designated support person may remain overnight. The visitor must pass COVID-19 screenings, use hand sanitizer when entering and exiting the patient's room and wear a mask at all times, including in the patient's room. Patients must also wear a mask when staff or their visitor are in the room. Masking is required regardless of vaccination status.

## 2021-11-26 ENCOUNTER — Other Ambulatory Visit: Payer: Self-pay

## 2021-11-26 ENCOUNTER — Other Ambulatory Visit
Admission: RE | Admit: 2021-11-26 | Discharge: 2021-11-26 | Disposition: A | Discharge status: Home / Self Care | Payer: Medicaid Other | Source: Ambulatory Visit | Attending: Orthopedic Surgery | Admitting: Orthopedic Surgery

## 2021-11-26 DIAGNOSIS — Z20822 Contact with and (suspected) exposure to covid-19: Secondary | ICD-10-CM | POA: Insufficient documentation

## 2021-11-26 DIAGNOSIS — Z01812 Encounter for preprocedural laboratory examination: Secondary | ICD-10-CM | POA: Insufficient documentation

## 2021-11-26 LAB — SARS CORONAVIRUS 2 (TAT 6-24 HRS): SARS Coronavirus 2: NEGATIVE

## 2021-12-06 NOTE — Addendum Note (Signed)
Encounter addended by: Melanee Left, RN on: 12/06/2021 6:02 PM  Actions taken: Order Reconciliation Section accessed

## 2021-12-14 ENCOUNTER — Other Ambulatory Visit
Admission: RE | Admit: 2021-12-14 | Discharge: 2021-12-14 | Disposition: A | Discharge status: Home / Self Care | Payer: Medicaid Other | Source: Ambulatory Visit | Attending: Orthopedic Surgery | Admitting: Orthopedic Surgery

## 2021-12-14 ENCOUNTER — Other Ambulatory Visit: Payer: Self-pay

## 2021-12-14 DIAGNOSIS — Z20822 Contact with and (suspected) exposure to covid-19: Secondary | ICD-10-CM | POA: Insufficient documentation

## 2021-12-14 LAB — SARS CORONAVIRUS 2 (TAT 6-24 HRS): SARS Coronavirus 2: NEGATIVE

## 2021-12-16 ENCOUNTER — Encounter: Payer: Self-pay | Admitting: Orthopedic Surgery

## 2021-12-16 ENCOUNTER — Ambulatory Visit: Payer: Medicaid Other | Admitting: Certified Registered"

## 2021-12-16 ENCOUNTER — Ambulatory Visit: Payer: Medicaid Other | Admitting: Urgent Care

## 2021-12-16 ENCOUNTER — Observation Stay
Admission: RE | Admit: 2021-12-16 | Discharge: 2021-12-17 | Disposition: A | Discharge status: Home / Self Care | Payer: Medicaid Other | Attending: Orthopedic Surgery | Admitting: Orthopedic Surgery

## 2021-12-16 ENCOUNTER — Observation Stay: Payer: Medicaid Other

## 2021-12-16 ENCOUNTER — Encounter
Admission: RE | Disposition: A | Discharge status: Home / Self Care | Payer: Self-pay | Source: Home / Self Care | Attending: Orthopedic Surgery

## 2021-12-16 ENCOUNTER — Other Ambulatory Visit: Payer: Self-pay

## 2021-12-16 DIAGNOSIS — E039 Hypothyroidism, unspecified: Secondary | ICD-10-CM | POA: Insufficient documentation

## 2021-12-16 DIAGNOSIS — J449 Chronic obstructive pulmonary disease, unspecified: Secondary | ICD-10-CM | POA: Insufficient documentation

## 2021-12-16 DIAGNOSIS — E119 Type 2 diabetes mellitus without complications: Secondary | ICD-10-CM | POA: Diagnosis not present

## 2021-12-16 DIAGNOSIS — Z96651 Presence of right artificial knee joint: Secondary | ICD-10-CM

## 2021-12-16 DIAGNOSIS — Z79899 Other long term (current) drug therapy: Secondary | ICD-10-CM | POA: Insufficient documentation

## 2021-12-16 DIAGNOSIS — Z96652 Presence of left artificial knee joint: Secondary | ICD-10-CM | POA: Insufficient documentation

## 2021-12-16 DIAGNOSIS — E669 Obesity, unspecified: Secondary | ICD-10-CM | POA: Diagnosis not present

## 2021-12-16 DIAGNOSIS — Z7984 Long term (current) use of oral hypoglycemic drugs: Secondary | ICD-10-CM | POA: Diagnosis not present

## 2021-12-16 DIAGNOSIS — M1711 Unilateral primary osteoarthritis, right knee: Secondary | ICD-10-CM | POA: Diagnosis not present

## 2021-12-16 DIAGNOSIS — Z791 Long term (current) use of non-steroidal anti-inflammatories (NSAID): Secondary | ICD-10-CM | POA: Insufficient documentation

## 2021-12-16 DIAGNOSIS — Z7951 Long term (current) use of inhaled steroids: Secondary | ICD-10-CM | POA: Insufficient documentation

## 2021-12-16 DIAGNOSIS — Z6830 Body mass index (BMI) 30.0-30.9, adult: Secondary | ICD-10-CM | POA: Insufficient documentation

## 2021-12-16 DIAGNOSIS — G8918 Other acute postprocedural pain: Secondary | ICD-10-CM

## 2021-12-16 HISTORY — PX: TOTAL KNEE ARTHROPLASTY: SHX125

## 2021-12-16 LAB — GLUCOSE, CAPILLARY
Glucose-Capillary: 110 mg/dL — ABNORMAL HIGH (ref 70–99)
Glucose-Capillary: 144 mg/dL — ABNORMAL HIGH (ref 70–99)
Glucose-Capillary: 157 mg/dL — ABNORMAL HIGH (ref 70–99)
Glucose-Capillary: 144 mg/dL — ABNORMAL HIGH (ref 70–99)
Glucose-Capillary: 128 mg/dL — ABNORMAL HIGH (ref 70–99)

## 2021-12-16 LAB — CBC
HCT: 37.3 % (ref 36.0–46.0)
Hemoglobin: 12.2 g/dL (ref 12.0–15.0)
MCH: 29.2 pg (ref 26.0–34.0)
MCHC: 32.7 g/dL (ref 30.0–36.0)
MCV: 89.2 fL (ref 80.0–100.0)
Platelets: 378 10*3/uL (ref 150–400)
RBC: 4.18 MIL/uL (ref 3.87–5.11)
RDW: 13.2 % (ref 11.5–15.5)
WBC: 21.9 10*3/uL — ABNORMAL HIGH (ref 4.0–10.5)
nRBC: 0 % (ref 0.0–0.2)

## 2021-12-16 LAB — TYPE AND SCREEN
ABO/RH(D): O POS
Antibody Screen: NEGATIVE

## 2021-12-16 LAB — CREATININE, SERUM
Creatinine, Ser: 0.63 mg/dL (ref 0.44–1.00)
GFR, Estimated: >60 mL/min (ref >60)

## 2021-12-16 SURGERY — ARTHROPLASTY, KNEE, TOTAL
Anesthesia: Spinal | Site: Knee | Laterality: Right

## 2021-12-16 MED ORDER — SODIUM CHLORIDE 0.9 % IR SOLN
Status: DC | PRN
  Administered 2021-12-16: 08:00:00 1000 mL

## 2021-12-16 MED ORDER — HYDROMORPHONE HCL 1 MG/ML IJ SOLN
INTRAMUSCULAR | Status: AC
  Filled 2021-12-16: qty 1

## 2021-12-16 MED ORDER — DOCUSATE SODIUM 100 MG PO CAPS
100.0000 mg | ORAL_CAPSULE | Freq: Two times a day (BID) | ORAL | Status: DC
  Administered 2021-12-16: 22:00:00 100 mg via ORAL

## 2021-12-16 MED ORDER — CHLORHEXIDINE GLUCONATE 0.12 % MT SOLN
OROMUCOSAL | Status: AC
  Administered 2021-12-16: 07:00:00 15 mL via OROMUCOSAL
  Filled 2021-12-16: qty 15

## 2021-12-16 MED ORDER — ALBUTEROL SULFATE HFA 108 (90 BASE) MCG/ACT IN AERS: 2.0000 | INHALATION_SPRAY | RESPIRATORY_TRACT | Status: DC | PRN

## 2021-12-16 MED ORDER — CHLORHEXIDINE GLUCONATE 0.12 % MT SOLN: 15.0000 mL | Freq: Once | OROMUCOSAL | Status: AC

## 2021-12-16 MED ORDER — PROPOFOL 10 MG/ML IV BOLUS
INTRAVENOUS | Status: DC | PRN
  Administered 2021-12-16: 50 mg via INTRAVENOUS

## 2021-12-16 MED ORDER — TIZANIDINE HCL 4 MG PO TABS
4.0000 mg | ORAL_TABLET | Freq: Three times a day (TID) | ORAL | Status: DC | PRN
  Filled 2021-12-16: qty 1

## 2021-12-16 MED ORDER — NORTRIPTYLINE HCL 10 MG PO CAPS
10.0000 mg | ORAL_CAPSULE | Freq: Every day | ORAL | Status: DC
  Administered 2021-12-16: 22:00:00 10 mg via ORAL
  Filled 2021-12-16 (×2): qty 1

## 2021-12-16 MED ORDER — FLUTICASONE FUROATE-VILANTEROL 200-25 MCG/ACT IN AEPB
1.0000 | INHALATION_SPRAY | Freq: Every day | RESPIRATORY_TRACT | Status: DC
  Administered 2021-12-16 – 2021-12-17 (×2): 1 via RESPIRATORY_TRACT
  Filled 2021-12-16: qty 28

## 2021-12-16 MED ORDER — OXYCODONE HCL 5 MG PO TABS
ORAL_TABLET | ORAL | Status: AC
  Administered 2021-12-16: 21:00:00 20 mg via ORAL
  Filled 2021-12-16: qty 2

## 2021-12-16 MED ORDER — POLYETHYLENE GLYCOL 3350 17 G PO PACK
17.0000 g | PACK | Freq: Every day | ORAL | Status: DC | PRN
  Filled 2021-12-16: qty 1

## 2021-12-16 MED ORDER — HYDROMORPHONE HCL 1 MG/ML IJ SOLN
0.5000 mg | INTRAMUSCULAR | Status: DC | PRN
  Administered 2021-12-16 – 2021-12-17 (×4): 1 mg via INTRAVENOUS

## 2021-12-16 MED ORDER — PANTOPRAZOLE SODIUM 40 MG PO TBEC
40.0000 mg | DELAYED_RELEASE_TABLET | Freq: Every day | ORAL | Status: DC
  Administered 2021-12-16: 22:00:00 40 mg via ORAL
  Filled 2021-12-16 (×2): qty 1

## 2021-12-16 MED ORDER — METOCLOPRAMIDE HCL 5 MG/ML IJ SOLN: 5.0000 mg | Freq: Three times a day (TID) | INTRAMUSCULAR | Status: DC | PRN

## 2021-12-16 MED ORDER — METHOCARBAMOL 500 MG PO TABS
ORAL_TABLET | ORAL | Status: AC
  Filled 2021-12-16: qty 1

## 2021-12-16 MED ORDER — OXYCODONE HCL 5 MG PO TABS
ORAL_TABLET | ORAL | Status: AC
  Filled 2021-12-16: qty 4

## 2021-12-16 MED ORDER — FAMOTIDINE 20 MG PO TABS
ORAL_TABLET | ORAL | Status: AC
  Administered 2021-12-16: 07:00:00 20 mg via ORAL
  Filled 2021-12-16: qty 1

## 2021-12-16 MED ORDER — PREGABALIN 75 MG PO CAPS
ORAL_CAPSULE | ORAL | Status: AC
  Filled 2021-12-16: qty 2

## 2021-12-16 MED ORDER — DIPHENHYDRAMINE HCL 12.5 MG/5ML PO ELIX
12.5000 mg | ORAL_SOLUTION | ORAL | Status: DC | PRN
  Filled 2021-12-16: qty 10

## 2021-12-16 MED ORDER — ACETAMINOPHEN 500 MG PO TABS
ORAL_TABLET | ORAL | Status: AC
  Filled 2021-12-16: qty 2

## 2021-12-16 MED ORDER — ACETAMINOPHEN 10 MG/ML IV SOLN
INTRAVENOUS | Status: AC
  Filled 2021-12-16: qty 100

## 2021-12-16 MED ORDER — CEFAZOLIN SODIUM-DEXTROSE 2-4 GM/100ML-% IV SOLN
INTRAVENOUS | Status: AC
  Filled 2021-12-16: qty 100

## 2021-12-16 MED ORDER — NALOXEGOL OXALATE 12.5 MG PO TABS
12.5000 mg | ORAL_TABLET | Freq: Every day | ORAL | Status: DC
  Administered 2021-12-16 – 2021-12-17 (×2): 12.5 mg via ORAL
  Filled 2021-12-16 (×2): qty 1

## 2021-12-16 MED ORDER — TIOTROPIUM BROMIDE MONOHYDRATE 18 MCG IN CAPS
18.0000 ug | ORAL_CAPSULE | Freq: Every day | RESPIRATORY_TRACT | Status: DC
  Administered 2021-12-16 – 2021-12-17 (×2): 18 ug via RESPIRATORY_TRACT
  Filled 2021-12-16: qty 5

## 2021-12-16 MED ORDER — SODIUM CHLORIDE FLUSH 0.9 % IV SOLN
INTRAVENOUS | Status: AC
  Filled 2021-12-16: qty 40

## 2021-12-16 MED ORDER — PHENYLEPHRINE HCL-NACL 20-0.9 MG/250ML-% IV SOLN
INTRAVENOUS | Status: DC | PRN
  Administered 2021-12-16: 30 ug/min via INTRAVENOUS

## 2021-12-16 MED ORDER — SODIUM CHLORIDE 0.9 % IV SOLN: INTRAVENOUS | Status: DC

## 2021-12-16 MED ORDER — PROPOFOL 1000 MG/100ML IV EMUL
INTRAVENOUS | Status: AC
  Filled 2021-12-16: qty 100

## 2021-12-16 MED ORDER — ZOLPIDEM TARTRATE 5 MG PO TABS: 5.0000 mg | ORAL_TABLET | Freq: Every evening | ORAL | Status: DC | PRN

## 2021-12-16 MED ORDER — BUPIVACAINE HCL (PF) 0.5 % IJ SOLN
INTRAMUSCULAR | Status: AC
  Filled 2021-12-16: qty 10

## 2021-12-16 MED ORDER — SODIUM CHLORIDE 0.9 % IR SOLN
Status: DC | PRN
  Administered 2021-12-16: 08:00:00 3012 mL

## 2021-12-16 MED ORDER — METFORMIN HCL 500 MG PO TABS
500.0000 mg | ORAL_TABLET | Freq: Two times a day (BID) | ORAL | Status: DC
  Administered 2021-12-16 – 2021-12-17 (×2): 500 mg via ORAL
  Filled 2021-12-16 (×4): qty 1

## 2021-12-16 MED ORDER — FENTANYL CITRATE (PF) 100 MCG/2ML IJ SOLN
INTRAMUSCULAR | Status: AC
  Administered 2021-12-16: 10:00:00 25 ug via INTRAVENOUS
  Filled 2021-12-16: qty 2

## 2021-12-16 MED ORDER — HYDROMORPHONE HCL 1 MG/ML IJ SOLN
INTRAMUSCULAR | Status: AC
  Administered 2021-12-16: 15:00:00 1 mg via INTRAVENOUS
  Filled 2021-12-16: qty 1

## 2021-12-16 MED ORDER — ALUM & MAG HYDROXIDE-SIMETH 200-200-20 MG/5ML PO SUSP: 30.0000 mL | ORAL | Status: DC | PRN

## 2021-12-16 MED ORDER — ONDANSETRON HCL 4 MG/2ML IJ SOLN
INTRAMUSCULAR | Status: DC | PRN
  Administered 2021-12-16: 4 mg via INTRAVENOUS

## 2021-12-16 MED ORDER — TRAMADOL HCL 50 MG PO TABS
50.0000 mg | ORAL_TABLET | Freq: Four times a day (QID) | ORAL | Status: DC
  Administered 2021-12-16 – 2021-12-17 (×3): 50 mg via ORAL

## 2021-12-16 MED ORDER — METOCLOPRAMIDE HCL 5 MG PO TABS
5.0000 mg | ORAL_TABLET | Freq: Three times a day (TID) | ORAL | Status: DC | PRN
  Filled 2021-12-16: qty 2

## 2021-12-16 MED ORDER — CEFAZOLIN SODIUM-DEXTROSE 2-4 GM/100ML-% IV SOLN
INTRAVENOUS | Status: AC
  Administered 2021-12-16: 20:00:00 2 g via INTRAVENOUS
  Filled 2021-12-16: qty 100

## 2021-12-16 MED ORDER — INSULIN ASPART 100 UNIT/ML IJ SOLN
0.0000 [IU] | Freq: Three times a day (TID) | INTRAMUSCULAR | Status: DC
  Administered 2021-12-16: 18:00:00 2 [IU] via SUBCUTANEOUS
  Administered 2021-12-16 – 2021-12-17 (×2): 3 [IU] via SUBCUTANEOUS

## 2021-12-16 MED ORDER — OXYCODONE HCL 5 MG PO TABS
20.0000 mg | ORAL_TABLET | ORAL | Status: DC | PRN
  Administered 2021-12-16 – 2021-12-17 (×2): 20 mg via ORAL

## 2021-12-16 MED ORDER — SODIUM CHLORIDE (PF) 0.9 % IJ SOLN
INTRAMUSCULAR | Status: DC | PRN
  Administered 2021-12-16: 09:00:00 91 mL

## 2021-12-16 MED ORDER — INSULIN ASPART 100 UNIT/ML IJ SOLN
INTRAMUSCULAR | Status: AC
  Filled 2021-12-16: qty 1

## 2021-12-16 MED ORDER — PHENOL 1.4 % MT LIQD
1.0000 | OROMUCOSAL | Status: DC | PRN
  Filled 2021-12-16: qty 177

## 2021-12-16 MED ORDER — CEFAZOLIN SODIUM-DEXTROSE 2-4 GM/100ML-% IV SOLN
2.0000 g | INTRAVENOUS | Status: AC
  Administered 2021-12-16: 08:00:00 2 g via INTRAVENOUS

## 2021-12-16 MED ORDER — FLUTICASONE PROPIONATE 50 MCG/ACT NA SUSP
2.0000 | Freq: Every day | NASAL | Status: DC | PRN
  Filled 2021-12-16: qty 16

## 2021-12-16 MED ORDER — FENTANYL CITRATE (PF) 100 MCG/2ML IJ SOLN
25.0000 ug | INTRAMUSCULAR | Status: DC | PRN
  Administered 2021-12-16 (×2): 25 ug via INTRAVENOUS

## 2021-12-16 MED ORDER — FLEET ENEMA 7-19 GM/118ML RE ENEM: 1.0000 | ENEMA | Freq: Once | RECTAL | Status: DC | PRN

## 2021-12-16 MED ORDER — METHOCARBAMOL 500 MG PO TABS
500.0000 mg | ORAL_TABLET | Freq: Four times a day (QID) | ORAL | Status: DC | PRN
  Administered 2021-12-16 – 2021-12-17 (×2): 500 mg via ORAL

## 2021-12-16 MED ORDER — ESTRADIOL 1 MG PO TABS
1.0000 mg | ORAL_TABLET | Freq: Every day | ORAL | Status: DC
  Administered 2021-12-16 – 2021-12-17 (×2): 1 mg via ORAL
  Filled 2021-12-16 (×2): qty 1

## 2021-12-16 MED ORDER — SUMATRIPTAN SUCCINATE 50 MG PO TABS
100.0000 mg | ORAL_TABLET | ORAL | Status: DC | PRN
  Filled 2021-12-16: qty 2

## 2021-12-16 MED ORDER — BISACODYL 10 MG RE SUPP
10.0000 mg | Freq: Every day | RECTAL | Status: DC | PRN
  Filled 2021-12-16: qty 1

## 2021-12-16 MED ORDER — MENTHOL 3 MG MT LOZG
1.0000 | LOZENGE | OROMUCOSAL | Status: DC | PRN
  Filled 2021-12-16: qty 9

## 2021-12-16 MED ORDER — ALBUTEROL SULFATE (2.5 MG/3ML) 0.083% IN NEBU: 2.5000 mg | INHALATION_SOLUTION | Freq: Four times a day (QID) | RESPIRATORY_TRACT | Status: DC | PRN

## 2021-12-16 MED ORDER — PROPOFOL 500 MG/50ML IV EMUL
INTRAVENOUS | Status: DC | PRN
  Administered 2021-12-16: 100 ug/kg/min via INTRAVENOUS

## 2021-12-16 MED ORDER — PRONTOSAN WOUND IRRIGATION OPTIME
TOPICAL | Status: DC | PRN
  Administered 2021-12-16: 1

## 2021-12-16 MED ORDER — BUPIVACAINE-EPINEPHRINE (PF) 0.25% -1:200000 IJ SOLN
INTRAMUSCULAR | Status: AC
  Filled 2021-12-16: qty 30

## 2021-12-16 MED ORDER — ACETAMINOPHEN 500 MG PO TABS
1000.0000 mg | ORAL_TABLET | Freq: Four times a day (QID) | ORAL | Status: AC
  Administered 2021-12-16 – 2021-12-17 (×4): 1000 mg via ORAL

## 2021-12-16 MED ORDER — ONDANSETRON HCL 4 MG/2ML IJ SOLN: 4.0000 mg | Freq: Four times a day (QID) | INTRAMUSCULAR | Status: DC | PRN

## 2021-12-16 MED ORDER — MONTELUKAST SODIUM 10 MG PO TABS
10.0000 mg | ORAL_TABLET | Freq: Every day | ORAL | Status: DC
  Administered 2021-12-16 – 2021-12-17 (×2): 10 mg via ORAL
  Filled 2021-12-16 (×2): qty 1

## 2021-12-16 MED ORDER — MORPHINE SULFATE (PF) 10 MG/ML IV SOLN
INTRAVENOUS | Status: AC
  Filled 2021-12-16: qty 1

## 2021-12-16 MED ORDER — METHOCARBAMOL 500 MG PO TABS
ORAL_TABLET | ORAL | Status: AC
  Administered 2021-12-16: 500 mg via ORAL
  Filled 2021-12-16: qty 1

## 2021-12-16 MED ORDER — TRAMADOL HCL 50 MG PO TABS
ORAL_TABLET | ORAL | Status: AC
  Administered 2021-12-16: 50 mg via ORAL
  Filled 2021-12-16: qty 1

## 2021-12-16 MED ORDER — FAMOTIDINE 20 MG PO TABS: 20.0000 mg | ORAL_TABLET | Freq: Once | ORAL | Status: AC

## 2021-12-16 MED ORDER — METHOCARBAMOL 1000 MG/10ML IJ SOLN
500.0000 mg | Freq: Four times a day (QID) | INTRAVENOUS | Status: DC | PRN
  Filled 2021-12-16: qty 5

## 2021-12-16 MED ORDER — LORATADINE 10 MG PO TABS
10.0000 mg | ORAL_TABLET | Freq: Every day | ORAL | Status: DC
  Administered 2021-12-16 – 2021-12-17 (×2): 10 mg via ORAL
  Filled 2021-12-16 (×2): qty 1

## 2021-12-16 MED ORDER — BUPIVACAINE HCL (PF) 0.5 % IJ SOLN
INTRAMUSCULAR | Status: DC | PRN
  Administered 2021-12-16: 2.5 mL

## 2021-12-16 MED ORDER — NEOMYCIN-POLYMYXIN B GU 40-200000 IR SOLN
Status: AC
  Filled 2021-12-16: qty 20

## 2021-12-16 MED ORDER — TRAMADOL HCL 50 MG PO TABS
ORAL_TABLET | ORAL | Status: AC
  Filled 2021-12-16: qty 1

## 2021-12-16 MED ORDER — OXYCODONE HCL 5 MG PO TABS
5.0000 mg | ORAL_TABLET | ORAL | Status: DC | PRN
  Administered 2021-12-16: 17:00:00 10 mg via ORAL

## 2021-12-16 MED ORDER — ACETAMINOPHEN 325 MG PO TABS: 325.0000 mg | ORAL_TABLET | Freq: Four times a day (QID) | ORAL | Status: DC | PRN

## 2021-12-16 MED ORDER — DOCUSATE SODIUM 100 MG PO CAPS
ORAL_CAPSULE | ORAL | Status: AC
  Filled 2021-12-16: qty 1

## 2021-12-16 MED ORDER — PREGABALIN 75 MG PO CAPS
150.0000 mg | ORAL_CAPSULE | Freq: Two times a day (BID) | ORAL | Status: DC
  Administered 2021-12-16 – 2021-12-17 (×2): 150 mg via ORAL

## 2021-12-16 MED ORDER — PHENYLEPHRINE HCL (PRESSORS) 10 MG/ML IV SOLN
INTRAVENOUS | Status: AC
  Filled 2021-12-16: qty 1

## 2021-12-16 MED ORDER — METHOCARBAMOL 500 MG PO TABS
ORAL_TABLET | ORAL | Status: AC
  Administered 2021-12-16: 18:00:00 500 mg via ORAL
  Filled 2021-12-16: qty 1

## 2021-12-16 MED ORDER — ORAL CARE MOUTH RINSE: 15.0000 mL | Freq: Once | OROMUCOSAL | Status: AC

## 2021-12-16 MED ORDER — LEVOTHYROXINE SODIUM 200 MCG PO TABS
200.0000 ug | ORAL_TABLET | Freq: Every day | ORAL | Status: DC
  Administered 2021-12-16: 22:00:00 200 ug via ORAL
  Filled 2021-12-16 (×2): qty 1

## 2021-12-16 MED ORDER — MIDAZOLAM HCL 2 MG/2ML IJ SOLN
INTRAMUSCULAR | Status: AC
  Filled 2021-12-16: qty 2

## 2021-12-16 MED ORDER — ONDANSETRON HCL 4 MG/2ML IJ SOLN
INTRAMUSCULAR | Status: AC
  Filled 2021-12-16: qty 2

## 2021-12-16 MED ORDER — SIMVASTATIN 40 MG PO TABS
40.0000 mg | ORAL_TABLET | Freq: Every day | ORAL | Status: DC
  Administered 2021-12-16: 22:00:00 40 mg via ORAL
  Filled 2021-12-16 (×2): qty 1

## 2021-12-16 MED ORDER — HYDROMORPHONE HCL 1 MG/ML IJ SOLN
INTRAMUSCULAR | Status: AC
  Administered 2021-12-16: 20:00:00 1 mg via INTRAVENOUS
  Filled 2021-12-16: qty 1

## 2021-12-16 MED ORDER — OXYCODONE HCL 5 MG PO TABS
ORAL_TABLET | ORAL | Status: AC
  Administered 2021-12-16: 10:00:00 10 mg via ORAL
  Filled 2021-12-16: qty 2

## 2021-12-16 MED ORDER — OXYCODONE HCL 5 MG PO TABS
ORAL_TABLET | ORAL | Status: AC
  Administered 2021-12-16: 13:00:00 20 mg
  Filled 2021-12-16: qty 4

## 2021-12-16 MED ORDER — ONDANSETRON HCL 4 MG PO TABS
4.0000 mg | ORAL_TABLET | Freq: Four times a day (QID) | ORAL | Status: DC | PRN
  Filled 2021-12-16: qty 1

## 2021-12-16 MED ORDER — ENOXAPARIN SODIUM 30 MG/0.3ML IJ SOSY
30.0000 mg | PREFILLED_SYRINGE | Freq: Two times a day (BID) | INTRAMUSCULAR | Status: DC
  Administered 2021-12-17: 10:00:00 30 mg via SUBCUTANEOUS
  Filled 2021-12-16 (×2): qty 0.3

## 2021-12-16 MED ORDER — LIRAGLUTIDE 18 MG/3ML ~~LOC~~ SOPN: 1.8000 mg | PEN_INJECTOR | Freq: Every evening | SUBCUTANEOUS | Status: DC

## 2021-12-16 MED ORDER — CEFAZOLIN SODIUM-DEXTROSE 2-4 GM/100ML-% IV SOLN
2.0000 g | Freq: Four times a day (QID) | INTRAVENOUS | Status: AC
  Administered 2021-12-16: 15:00:00 2 g via INTRAVENOUS
  Filled 2021-12-16 (×2): qty 100

## 2021-12-16 MED ORDER — TRAMADOL HCL 50 MG PO TABS
ORAL_TABLET | ORAL | Status: AC
  Administered 2021-12-17: 13:00:00 50 mg via ORAL
  Filled 2021-12-16: qty 1

## 2021-12-16 MED ORDER — BUPIVACAINE LIPOSOME 1.3 % IJ SUSP
INTRAMUSCULAR | Status: AC
  Filled 2021-12-16: qty 20

## 2021-12-16 MED ORDER — MIDAZOLAM HCL 5 MG/5ML IJ SOLN
INTRAMUSCULAR | Status: DC | PRN
  Administered 2021-12-16: 2 mg via INTRAVENOUS

## 2021-12-16 MED ORDER — BUPROPION HCL ER (XL) 150 MG PO TB24
150.0000 mg | ORAL_TABLET | Freq: Every day | ORAL | Status: DC
  Administered 2021-12-16 – 2021-12-17 (×2): 150 mg via ORAL
  Filled 2021-12-16 (×2): qty 1

## 2021-12-16 SURGICAL SUPPLY — 70 items
BLADE SAGITTAL 25.0X1.19X90 (BLADE) ×2 IMPLANT
BLADE SAGITTAL 25.0X1.19X90MM (BLADE) ×1
BLADE SAW 90X13X1.19 OSCILLAT (BLADE) ×1 IMPLANT
BNDG ELASTIC 6X5.8 VLCR STR LF (GAUZE/BANDAGES/DRESSINGS) ×1 IMPLANT
CANISTER WOUND CARE 500ML ATS (WOUND CARE) ×3 IMPLANT
CEMENT HV SMART SET (Cement) ×6 IMPLANT
CHLORAPREP W/TINT 26 (MISCELLANEOUS) ×6 IMPLANT
COOLER POLAR GLACIER W/PUMP (MISCELLANEOUS) ×3 IMPLANT
CUFF TOURN SGL QUICK 24 (TOURNIQUET CUFF)
CUFF TOURN SGL QUICK 34 (TOURNIQUET CUFF)
CUFF TRNQT CYL 24X4X16.5-23 (TOURNIQUET CUFF) IMPLANT
CUFF TRNQT CYL 34X4.125X (TOURNIQUET CUFF) IMPLANT
DRAPE 3/4 80X56 (DRAPES) ×6 IMPLANT
DRSG MEPILEX SACRM 8.7X9.8 (GAUZE/BANDAGES/DRESSINGS) ×3 IMPLANT
ELECT CAUTERY BLADE 6.4 (BLADE) ×3 IMPLANT
ELECT REM PT RETURN 9FT ADLT (ELECTROSURGICAL) ×3
ELECTRODE REM PT RTRN 9FT ADLT (ELECTROSURGICAL) ×1 IMPLANT
FEMORAL COMP SZ4 RIGHT SPHERE (Femur) ×2 IMPLANT
GAUZE 4X4 16PLY ~~LOC~~+RFID DBL (SPONGE) ×3 IMPLANT
GAUZE SPONGE 4X4 12PLY STRL (GAUZE/BANDAGES/DRESSINGS) ×1 IMPLANT
GAUZE XEROFORM 1X8 LF (GAUZE/BANDAGES/DRESSINGS) ×1 IMPLANT
GLOVE SURG ORTHO LTX SZ8 (GLOVE) ×3 IMPLANT
GLOVE SURG SYN 9.0  PF PI (GLOVE) ×2
GLOVE SURG SYN 9.0 PF PI (GLOVE) ×1 IMPLANT
GLOVE SURG UNDER LTX SZ8 (GLOVE) ×3 IMPLANT
GLOVE SURG UNDER POLY LF SZ9 (GLOVE) ×3 IMPLANT
GOWN SRG 2XL LVL 4 RGLN SLV (GOWNS) ×1 IMPLANT
GOWN STRL NON-REIN 2XL LVL4 (GOWNS) ×2
GOWN STRL REUS W/ TWL LRG LVL3 (GOWN DISPOSABLE) ×1 IMPLANT
GOWN STRL REUS W/ TWL XL LVL3 (GOWN DISPOSABLE) ×1 IMPLANT
GOWN STRL REUS W/TWL LRG LVL3 (GOWN DISPOSABLE) ×2
GOWN STRL REUS W/TWL XL LVL3 (GOWN DISPOSABLE) ×2
HOLDER FOLEY CATH W/STRAP (MISCELLANEOUS) ×3 IMPLANT
INSERT TIBIAL SZ4 RIGHT 10MM (Insert) ×2 IMPLANT
IV NS IRRIG 3000ML ARTHROMATIC (IV SOLUTION) ×3 IMPLANT
KIT PREVENA INCISION MGT20CM45 (CANNISTER) ×3 IMPLANT
KIT TURNOVER KIT A (KITS) ×3 IMPLANT
MANIFOLD NEPTUNE II (INSTRUMENTS) ×3 IMPLANT
NDL SAFETY ECLIPSE 18X1.5 (NEEDLE) ×1 IMPLANT
NDL SPNL 18GX3.5 QUINCKE PK (NEEDLE) ×1 IMPLANT
NDL SPNL 20GX3.5 QUINCKE YW (NEEDLE) ×1 IMPLANT
NEEDLE HYPO 18GX1.5 SHARP (NEEDLE) ×2
NEEDLE SPNL 18GX3.5 QUINCKE PK (NEEDLE) ×3 IMPLANT
NEEDLE SPNL 20GX3.5 QUINCKE YW (NEEDLE) ×3 IMPLANT
NS IRRIG 1000ML POUR BTL (IV SOLUTION) ×3 IMPLANT
PACK TOTAL KNEE (MISCELLANEOUS) ×3 IMPLANT
PAD WRAPON POLAR KNEE (MISCELLANEOUS) ×1 IMPLANT
PATELLA RESURFACING MEDACTA SZ (Bone Implant) ×2 IMPLANT
PENCIL SMOKE EVACUATOR COATED (MISCELLANEOUS) ×3 IMPLANT
PULSAVAC PLUS IRRIG FAN TIP (DISPOSABLE) ×3
SCALPEL PROTECTED #10 DISP (BLADE) ×6 IMPLANT
SOLUTION PRONTOSAN WOUND 350ML (IRRIGATION / IRRIGATOR) IMPLANT
SPONGE T-LAP 18X18 ~~LOC~~+RFID (SPONGE) ×9 IMPLANT
STAPLER SKIN PROX 35W (STAPLE) ×3 IMPLANT
STEM EXTENSION 11MMX30MM (Stem) ×2 IMPLANT
SUCTION FRAZIER HANDLE 10FR (MISCELLANEOUS) ×2
SUCTION TUBE FRAZIER 10FR DISP (MISCELLANEOUS) ×1 IMPLANT
SUT DVC 2 QUILL PDO  T11 36X36 (SUTURE) ×2
SUT DVC 2 QUILL PDO T11 36X36 (SUTURE) ×1 IMPLANT
SUT ETHIBOND 2 V 37 (SUTURE) IMPLANT
SUT V-LOC 90 ABS DVC 3-0 CL (SUTURE) ×3 IMPLANT
SYR 20ML LL LF (SYRINGE) ×1 IMPLANT
SYR 50ML LL SCALE MARK (SYRINGE) ×6 IMPLANT
TIBIAL TRAY FIXED 4 MEDACTA 02 (Joint) ×2 IMPLANT
TIP FAN IRRIG PULSAVAC PLUS (DISPOSABLE) ×1 IMPLANT
TOWEL OR 17X26 4PK STRL BLUE (TOWEL DISPOSABLE) ×3 IMPLANT
TOWER CARTRIDGE SMART MIX (DISPOSABLE) ×3 IMPLANT
TRAY FOLEY MTR SLVR 16FR STAT (SET/KITS/TRAYS/PACK) ×3 IMPLANT
WATER STERILE IRR 500ML POUR (IV SOLUTION) ×3 IMPLANT
WRAPON POLAR PAD KNEE (MISCELLANEOUS) ×3

## 2021-12-16 NOTE — Anesthesia Procedure Notes (Signed)
Spinal  Patient location during procedure: OR Start time: 12/16/2021 7:25 AM End time: 12/16/2021 7:35 AM Reason for block: surgical anesthesia Staffing Performed: resident/CRNA  Resident/CRNA: Esaw Grandchild, CRNA Preanesthetic Checklist Completed: patient identified, IV checked, site marked, risks and benefits discussed, surgical consent, monitors and equipment checked, pre-op evaluation and timeout performed Spinal Block Patient position: sitting Prep: DuraPrep Patient monitoring: heart rate, continuous pulse ox and blood pressure Approach: midline Location: L2-3 Injection technique: single-shot Needle Needle type: Sprotte  Needle gauge: 24 G Needle length: 9 cm Assessment Sensory level: T4 Events: CSF return

## 2021-12-16 NOTE — Transfer of Care (Signed)
Immediate Anesthesia Transfer of Care Note  Patient: Monica Terry  Procedure(s) Performed: TOTAL KNEE ARTHROPLASTY (Right: Knee)  Patient Location: PACU  Anesthesia Type:General and Spinal  Level of Consciousness: awake, alert  and oriented  Airway & Oxygen Therapy: Patient Spontanous Breathing and Patient connected to face mask oxygen  Post-op Assessment: Report given to RN and Post -op Vital signs reviewed and stable  Post vital signs: Reviewed and stable  Last Vitals:  Vitals Value Taken Time  BP 106/50 12/16/21 0932  Temp    Pulse 88 12/16/21 0932  Resp 17 12/16/21 0932  SpO2 95 % 12/16/21 0932    Last Pain:  Vitals:   12/16/21 0639  TempSrc: Tympanic         Complications: No notable events documented.
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
## 2021-12-16 NOTE — Anesthesia Preprocedure Evaluation (Signed)
Anesthesia Evaluation  Patient identified by MRN, date of birth, ID band Patient awake    Reviewed: Allergy & Precautions, NPO status , Patient's Chart, lab work & pertinent test results  History of Anesthesia Complications Negative for: history of anesthetic complications  Airway Mallampati: II       Dental  (+) Upper Dentures, Edentulous Upper, Dental Advidsory Given   Pulmonary neg shortness of breath, asthma , neg sleep apnea, COPD,  COPD inhaler and oxygen dependent, neg recent URI, Not current smoker, former smoker,  alpha1 antitrypsin deficiency          Cardiovascular (-) hypertension(-) angina(-) Past MI and (-) CHF (-) dysrhythmias + Valvular Problems/Murmurs      Neuro/Psych neg Seizures Anxiety Depression Bipolar Disorder    GI/Hepatic GERD  ,(+) Hepatitis -, C  Endo/Other  diabetes, Type 2, Oral Hypoglycemic AgentsHypothyroidism   Renal/GU negative Renal ROS     Musculoskeletal   Abdominal   Peds  Hematology   Anesthesia Other Findings Past Medical History: No date: Abnormal uterine bleeding No date: Anemia No date: Anxiety No date: Arthritis     Comment:  NECK No date: Bell's palsy     Comment:  right side No date: Bipolar 1 disorder (HCC) No date: Brittle bones No date: Complication of anesthesia     Comment:  had to come back to ER after shoulder scope block               breathing diffuculty No date: COPD (chronic obstructive pulmonary disease) (HCC) No date: Depression No date: Diabetes mellitus without complication (HCC) No date: GERD (gastroesophageal reflux disease) No date: Heart murmur 2016: Hepatitis     Comment:  c: treated by Dr. Allen Norris (harvoni) No date: Hypercholesteremia No date: Hypothyroidism 01/20/2017: Meningitis No date: Migraine headache     Comment:  after meningitis.  none since starting nortriptyline No date: Neck pain No date: Neck pain, chronic No date: Panic  attack No date: Sciatica No date: Shortness of breath dyspnea No date: Thyroid disease No date: Wears dentures     Comment:  full upper   Reproductive/Obstetrics                             Anesthesia Physical  Anesthesia Plan  ASA: 3  Anesthesia Plan: Spinal   Post-op Pain Management:    Induction: Intravenous  PONV Risk Score and Plan: Propofol infusion and TIVA  Airway Management Planned: Natural Airway  Additional Equipment:   Intra-op Plan:   Post-operative Plan:   Informed Consent: I have reviewed the patients History and Physical, chart, labs and discussed the procedure including the risks, benefits and alternatives for the proposed anesthesia with the patient or authorized representative who has indicated his/her understanding and acceptance.       Plan Discussed with:   Anesthesia Plan Comments:         Anesthesia Quick Evaluation

## 2021-12-16 NOTE — Op Note (Signed)
12/16/2021  9:32 AM  PATIENT:  Monica Terry  55 y.o. female  PRE-OPERATIVE DIAGNOSIS:  Primary osteoarthritis of right knee  M17.11  POST-OPERATIVE DIAGNOSIS:  Primary osteoarthritis of right knee  M17.11  PROCEDURE:  Procedure(s): TOTAL KNEE ARTHROPLASTY (Right)  SURGEON: Laurene Footman, MD  ASSISTANTS: Rachelle Hora, PA-C  ANESTHESIA:   spinal  EBL:  Total I/O In: 900 [I.V.:800; IV Piggyback:100] Out: 195 [Urine:175; Blood:20]  BLOOD ADMINISTERED:none  DRAINS:  Incisional wound VAC    LOCAL MEDICATIONS USED:  MARCAINE    and OTHER Exparel and morphine  SPECIMEN:  No Specimen  DISPOSITION OF SPECIMEN:  N/A  COUNTS:  YES  TOURNIQUET:   Total Tourniquet Time Documented: Thigh (Right) - 66 minutes Total: Thigh (Right) - 66 minutes   IMPLANTS: Medacta GM K sphere for right femur, 4 right tibia with 10 mm insert and short stem size 3 patella, all components cemented  DICTATION: .Dragon Dictation    patient was brought to the operating room and spinal anesthesia was obtained.  After prepping and draping the right leg in sterile fashion, and after patient identification and timeout procedures were completed, tourniquet was raised  and midline skin incision was made followed by medial parapatellar arthrotomy with  severe medial compartment osteoarthritis, severe patellofemoral arthritis and  mild lateral compartment arthritis, partial synovectomy was also carried out.   The ACL and PCL and fat pad were excised along with anterior horns of the meniscus. The proximal tibia cutting guide from  the extra medullary system was applied and the proximal tibia cut carried out.  The distal femoral cut was carried out in a similar fashion using intramedullary guide with 5 degree valgus cut.   The  for femoral cutting guide applied with anterior posterior and chamfer cuts made.  The posterior horns of the menisci were removed at this point.   Injection of the above medication was carried  out after the femoral and tibial cuts were carried out.  The  for baseplate trial was placed pinned into position and proximal tibial preparation carried out with drilling hand reaming and the keel punch followed by placement of the  for femur and sizing the tibial insert size   10 millimeter gave the best fit with stability and full extension.  The distal femoral drill holes were made in the notch cut for the trochlear groove was then carried out with trials were then removed the patella was cut using the patellar cutting guide and it sized to a size  3 after drill holes have been made  The knee was irrigated with pulsatile lavage and the bony surfaces dried the tibial component was cemented into place first.  Excess cement was removed and the polyethylene insert placed with a torque screw placed with a torque screwdriver tightened.  The distal femoral component was placed and the knee was held in extension as the patellar button was clamped into place.  After the cement was set, excess cement was removed and the knee was again irrigated thoroughly thoroughly irrigated.  The tourniquet was let down and hemostasis checked with electrocautery. The arthrotomy was repaired with a heavy Quill suture,  followed by 3-0 V lock subcuticular closure, skin staples followed by incisional wound VAC and Polar Care.Marland Kitchen  PLAN OF CARE: Admit for overnight observation  PATIENT DISPOSITION:  PACU - hemodynamically stable.

## 2021-12-16 NOTE — TOC Progression Note (Signed)
Transition of Care [facility]) - Progression Note    Patient Details  Name: Monica Terry MRN: [id number] Date of Birth: 1966-09-28  Transition of Care [facility]) CM/SW [place], RN Phone Number: 12/16/2021, 3:45 PM  Clinical Narrative:   Met with the patient at the bedside to discuss DC plan and needs She lives at home alone, will have assistance with her Boyfriend She stated due to financially unable she is not willing to go to STR SNF< She would have to sign over her check due to having Medicaid and she cant do that,  She will have transportation with her Boyfriend She needs a RW and a 3 in 1, I notified Rhonda at [facility], it will be delivered into the room prior to DC to take home She has had a previous surgery and is aware of what to expect, she went home after that surgry too I let her know that she may not be able to get [facility] services due to Medicaid, I will try to locate a [facility] agency to accept her, I reached out to [place] at [facility], awaiting a response [facility] has declined her Reached out to [facility] and awaiting  a response          Expected Discharge Plan and Services                                                 Social Determinants of Health (SDOH) Interventions    Readmission Risk Interventions No flowsheet data found.

## 2021-12-16 NOTE — Evaluation (Signed)
Physical Therapy Evaluation Patient Details Name: Monica Terry MRN: [id number] DOB: [birth year] Today's Date: 12/16/2021  History of Present Illness  admitted for acute hospitalization s/p R TKR, WBAT (12/16/21).  Clinical Impression   Patient resting in bed upon arrival to room; generally tensed and intermittently tearful due to pain in R LE.  Pain medications administered per RN prior to and during session as available; patient continues to rate at 9-10/10, but agreeable to participation with session as tolerated (hopeful that change of position would improve pain).  Demonstrates moderate impairment in strength (2+ to 3-/5) and ROM (12-40 degrees) to R LE; very guarded and somewhat resistive to active movement at times.  Currently requiring min assist for bed mobility; min assist +2 for sit/stand, basic transfer (bed/chair) via SPT with RW.  Very minimal tolerance for functional flexion or WBing to R LE, maintaining nearly in NWB with sit/stand transfer.  Consistent cuing for R foot contact with floor and WBing as tolerated during transfer to optimize safety/stability with transfer and to progress towards functional WBing R LE.  Continues to rate pain 9-10/10 despite movement and position change; unable to tolerate additional gait as a result.  Will continue to assess/progress as able; do anticipate very gradual progression towards goals. Would benefit from skilled PT to address above deficits and promote optimal return to PLOF.; recommend transition to STR upon discharge from acute hospitalization.    Recommendations for follow up therapy are one component of a multi-disciplinary discharge planning process, led by the attending physician.  Recommendations may be updated based on patient status, additional functional criteria and insurance authorization.  Follow Up Recommendations Skilled nursing-short term rehab (<3 hours/day)  [place] Recommended at Discharge Frequent or constant  Supervision/Assistance  Functional Status Assessment Patient has had a recent decline in their functional status and demonstrates the ability to make significant improvements in function in a reasonable and predictable amount of time.  Equipment Recommendations  Rolling walker (2 wheels);BSC/3in1    Recommendations for Other Services       Precautions / Restrictions Precautions Precautions: Fall Restrictions Weight Bearing Restrictions: Yes RLE Weight Bearing: Weight bearing as tolerated      Mobility  Bed Mobility Overal bed mobility: Needs Assistance Bed Mobility: Supine to Sit     Supine to sit: Min assist     General bed mobility comments: assist for R LE management    Transfers Overall transfer level: Needs assistance Equipment used: Rolling walker (2 wheels) Transfers: Bed to chair/wheelchair/BSC;Sit to/from Stand Sit to Stand: Min assist;+2 safety/equipment Stand pivot transfers: Min assist;+2 safety/equipment         General transfer comment: insists on elevated bed surfaces for edge of bed sitting ("it's too low and makes my knee bend too much").  Very limited tolerance for functional knee flexion and active WBing to R LE with movement transition or standing; nearly maintains in NWB with sit/stand.  Encouraged for foot flat, active WBing as tolerated to optimize stability with transfer    Ambulation/Gait               General Gait Details: Unable to tolerate due to pain  Stairs            Wheelchair Mobility    Modified Rankin (Stroke Patients Only)       Balance Overall balance assessment: Needs assistance Sitting-balance support: Feet supported;No upper extremity supported Sitting balance-Leahy Scale: Good   Postural control: Posterior lean (in attempt to avoid flexion/WBing to  R knee)   Standing balance-Leahy Scale: Fair                               Pertinent Vitals/Pain Pain Assessment: 0-10 Pain Score: 9  Pain  Location: R knee Pain Descriptors / Indicators: Aching;Grimacing;Guarding Pain Intervention(s): Limited activity within patient's tolerance;Monitored during session;Premedicated before session;Patient requesting pain meds-RN notified;RN gave pain meds during session;Repositioned    Home Living Family/patient expects to be discharged to:: Private residence Living Arrangements: Spouse/significant other Available Help at Discharge: Family Type of Home: House Home Access: Ramped entrance     Alternate Level Stairs-Number of Steps: 1 step down kitchen to bedroom          Prior Function Prior Level of Function : Independent/Modified Independent             Mobility Comments: Indep with ADLs, household and community mobilization       Hand Dominance        Extremity/Trunk Assessment   Upper Extremity Assessment Upper Extremity Assessment: Overall WFL for tasks assessed    Lower Extremity Assessment Lower Extremity Assessment:  (R knee with significant guarding, tolerating limited ROM due to pain; very limited activation of R quad for isolated therex, but does maintain static activation due to pain, guarding)       Communication   Communication: No difficulties  Cognition Arousal/Alertness: Awake/alert Behavior During Therapy: WFL for tasks assessed/performed Overall Cognitive Status: Within Functional Limits for tasks assessed                                 General Comments: generally hyperfocused/hyperaware of pain response        General Comments      Exercises Total Joint Exercises Goniometric ROM: R knee: 12- 40 degrees Other Exercises Other Exercises: Supine R LE therex, 1x10act/act assist ROM: ankle pumps, quad sets, SAQs, heel slides, hip abduct/adduct.  Does require active assist for movement throughout all planes due to pain, guarding with all movement.   Assessment/Plan    PT Assessment Patient needs continued PT services  PT Problem  List Decreased strength;Decreased range of motion;Decreased activity tolerance;Decreased balance;Decreased mobility;Decreased knowledge of use of DME;Decreased safety awareness;Pain;Decreased skin integrity       PT Treatment Interventions DME instruction;Gait training;Stair training;Functional mobility training;Balance training;Therapeutic activities;Therapeutic exercise;Patient/family education    PT Goals (Current goals can be found in the Care Plan section)  Acute Rehab PT Goals Patient Stated Goal: to go home when I leave the hospital PT Goal Formulation: With patient Time For Goal Achievement: 12/30/21 Potential to Achieve Goals: Fair    Frequency BID   Barriers to discharge        Co-evaluation               AM-PAC PT "6 Clicks" Mobility  Outcome Measure Help needed turning from your back to your side while in a flat bed without using bedrails?: A Little Help needed moving from lying on your back to sitting on the side of a flat bed without using bedrails?: A Little Help needed moving to and from a bed to a chair (including a wheelchair)?: A Little Help needed standing up from a chair using your arms (e.g., wheelchair or bedside chair)?: A Little Help needed to walk in hospital room?: A Lot Help needed climbing 3-5 steps with a railing? : A Lot 6 Click  Score: 16    End of Session Equipment Utilized During Treatment: Gait belt Activity Tolerance: Patient limited by pain Patient left: in chair;with call bell/phone within reach Nurse Communication: Mobility status PT Visit Diagnosis: Other abnormalities of gait and mobility (R26.89);Pain Pain - Right/Left: Right Pain - part of body: Knee    Time: 4935-5217 PT Time Calculation (min) (ACUTE ONLY): 28 min   Charges:   PT Evaluation $PT Eval Moderate Complexity: 1 Mod PT Treatments $Therapeutic Exercise: 8-22 mins       Dellis Voght H. Owens Shark, PT, DPT, NCS 12/16/21, 11:23 PM [phone]

## 2021-12-16 NOTE — Progress Notes (Signed)
OT Cancellation Note  Patient Details Name: Monica Terry MRN: [id number] DOB: Dec 24, 1966   Cancelled Treatment:    Reason Eval/Treat Not Completed: Pain limiting ability to participate. Consult received, chart reviewed. Spoke with PT who just finished working with her. Pt very pain limited at this time. Will hold OT evaluation and re-attempt next date in coordination with nursing for improved pain control prior to session.   Ardeth Perfect., MPH, MS, OTR/L ascom [phone] 12/16/21, 3:54 PM

## 2021-12-17 ENCOUNTER — Other Ambulatory Visit: Payer: Self-pay | Admitting: Orthopedic Surgery

## 2021-12-17 DIAGNOSIS — M1711 Unilateral primary osteoarthritis, right knee: Secondary | ICD-10-CM | POA: Diagnosis not present

## 2021-12-17 LAB — CBC
HCT: 39.1 % (ref 36.0–46.0)
Hemoglobin: 12.5 g/dL (ref 12.0–15.0)
MCH: 28.5 pg (ref 26.0–34.0)
MCHC: 32 g/dL (ref 30.0–36.0)
MCV: 89.1 fL (ref 80.0–100.0)
Platelets: 388 10*3/uL (ref 150–400)
RBC: 4.39 MIL/uL (ref 3.87–5.11)
RDW: 13.2 % (ref 11.5–15.5)
WBC: 17 10*3/uL — ABNORMAL HIGH (ref 4.0–10.5)
nRBC: 0 % (ref 0.0–0.2)

## 2021-12-17 LAB — BASIC METABOLIC PANEL
Anion gap: 6 (ref 5–15)
BUN: 7 mg/dL (ref 6–20)
CO2: 26 mmol/L (ref 22–32)
Calcium: 8.3 mg/dL — ABNORMAL LOW (ref 8.9–10.3)
Chloride: 101 mmol/L (ref 98–111)
Creatinine, Ser: 0.7 mg/dL (ref 0.44–1.00)
GFR, Estimated: >60 mL/min (ref >60)
Glucose, Bld: 188 mg/dL — ABNORMAL HIGH (ref 70–99)
Potassium: 4.5 mmol/L (ref 3.5–5.1)
Sodium: 133 mmol/L — ABNORMAL LOW (ref 135–145)

## 2021-12-17 LAB — GLUCOSE, CAPILLARY
Glucose-Capillary: 170 mg/dL — ABNORMAL HIGH (ref 70–99)
Glucose-Capillary: 197 mg/dL — ABNORMAL HIGH (ref 70–99)

## 2021-12-17 MED ORDER — OXYCODONE HCL 5 MG PO TABS
ORAL_TABLET | ORAL | Status: AC
  Administered 2021-12-17: 14:00:00 20 mg via ORAL
  Filled 2021-12-17: qty 1

## 2021-12-17 MED ORDER — OXYCODONE HCL 5 MG PO TABS
ORAL_TABLET | ORAL | Status: AC
  Filled 2021-12-17: qty 4

## 2021-12-17 MED ORDER — POLYETHYLENE GLYCOL 3350 17 G PO PACK: 17.0000 g | PACK | Freq: Every day | ORAL | 0 refills | Status: DC | PRN

## 2021-12-17 MED ORDER — HYDROMORPHONE HCL 1 MG/ML IJ SOLN
INTRAMUSCULAR | Status: AC
  Filled 2021-12-17: qty 1

## 2021-12-17 MED ORDER — METHOCARBAMOL 500 MG PO TABS
ORAL_TABLET | ORAL | Status: AC
  Filled 2021-12-17: qty 1

## 2021-12-17 MED ORDER — OXYCODONE HCL 20 MG PO TABS: 20.0000 mg | ORAL_TABLET | ORAL | 0 refills | Status: DC | PRN

## 2021-12-17 MED ORDER — ACETAMINOPHEN 500 MG PO TABS
ORAL_TABLET | ORAL | Status: AC
  Administered 2021-12-17: 06:00:00 1000 mg
  Filled 2021-12-17: qty 2

## 2021-12-17 MED ORDER — TRAMADOL HCL 50 MG PO TABS
ORAL_TABLET | ORAL | Status: AC
  Filled 2021-12-17: qty 1

## 2021-12-17 MED ORDER — ACETAMINOPHEN 500 MG PO TABS
ORAL_TABLET | ORAL | Status: AC
  Filled 2021-12-17: qty 2

## 2021-12-17 MED ORDER — OXYCODONE HCL 5 MG PO TABS
ORAL_TABLET | ORAL | Status: AC
  Administered 2021-12-17: 03:00:00 20 mg via ORAL
  Filled 2021-12-17: qty 4

## 2021-12-17 MED ORDER — OXYCODONE HCL 5 MG PO TABS
ORAL_TABLET | ORAL | Status: AC
  Filled 2021-12-17: qty 3

## 2021-12-17 MED ORDER — INSULIN ASPART 100 UNIT/ML IJ SOLN
INTRAMUSCULAR | Status: AC
  Administered 2021-12-17: 13:00:00 100 [IU]
  Filled 2021-12-17: qty 1

## 2021-12-17 MED ORDER — ENOXAPARIN SODIUM 40 MG/0.4ML IJ SOSY: 40.0000 mg | PREFILLED_SYRINGE | INTRAMUSCULAR | 0 refills | Status: DC

## 2021-12-17 MED ORDER — PREGABALIN 50 MG PO CAPS
ORAL_CAPSULE | ORAL | Status: AC
  Filled 2021-12-17: qty 3

## 2021-12-17 MED ORDER — DOCUSATE SODIUM 100 MG PO CAPS
ORAL_CAPSULE | ORAL | Status: AC
  Filled 2021-12-17: qty 1

## 2021-12-17 MED ORDER — INSULIN ASPART 100 UNIT/ML IJ SOLN
INTRAMUSCULAR | Status: AC
  Filled 2021-12-17: qty 1
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
## 2021-12-17 NOTE — Progress Notes (Signed)
Physical Therapy Treatment Patient Details Name: Monica Terry MRN: [id number] DOB: 1966-03-08 Today's Date: 12/17/2021   History of Present Illness admitted for acute hospitalization s/p R TKR, WBAT (12/16/21).    PT Comments    Pt was long sitting in bed, awake and oriented. She was pre-medicated shortly before session began. Pt is A and O x 4 and understands she will be Dcing home from hospital if unwilling to sign her check over to rehab." I'm planning on going home." She was able to exit L side of bed with use of sheet and UEs to assist RLE to EOB. No assistance provided from Chief Strategy Officer. Sat EOB and performed several ROM/knee flexion exercises prior to standing and ambulating. She stood and ambulated ~ 40 ft. Without LOB or safety concern. Limited wt acceptance on RLE due to pain. Distance overall limited by pt feeling " drugged up" form pain meds just issued. Author will return shortly to increase gait distance prior to pt being DC home with outpatient PT to follow. Recommend DC home with outpatient PT. Pt is unable to get HHPT.    Recommendations for follow up therapy are one component of a multi-disciplinary discharge planning process, led by the attending physician.  Recommendations may be updated based on patient status, additional functional criteria and insurance authorization.  Follow Up Recommendations  Outpatient PT (pt unable to get HHPT and unwilling to go to SNF)     Assistance Recommended at Discharge Intermittent Supervision/Assistance  Equipment Recommendations  Rolling walker (2 wheels);BSC/3in1       Precautions / Restrictions Precautions Precautions: Fall Restrictions Weight Bearing Restrictions: Yes RLE Weight Bearing: Weight bearing as tolerated     Mobility  Bed Mobility Overal bed mobility: Needs Assistance Bed Mobility: Supine to Sit     Supine to sit: Supervision     General bed mobility comments: pt was able to exit L side of bed with use of sheet  assisting RLE to floor. pt sleep in recliner at baseline and plans to return to sleeping in recliner at home when [facility]    Transfers Overall transfer level: Needs assistance Equipment used: Rolling walker (2 wheels) Transfers: Sit to/from Stand Sit to Stand: Supervision;Min guard (minimally elevated bed height)           General transfer comment: pt was able to stand from EOB and from transport chair with CGA-supervision. Vcs for increased BLE knee flex and fwd wt shift. Overall demonstrated safe technqiue and abilities. limited by pain    Ambulation/Gait Ambulation/Gait assistance: Supervision Gait Distance (Feet): 40 Feet Assistive device: Rolling walker (2 wheels) Gait Pattern/deviations: Step-to pattern (decreased wt acceptance on RLE. Struggles with heel strike -foot flat) Gait velocity: decreased     General Gait Details: Pt was able to ambulate 40 ft with RW prior to c/o "foggy/groggy" due to medication. BP was stable. pt just given pain meds prior to getting up OOB. Pryor Curia will return shortly to advance gait distances.       Balance Overall balance assessment: Needs assistance Sitting-balance support: Feet supported;No upper extremity supported Sitting balance-Leahy Scale: Good     Standing balance support: Bilateral upper extremity supported;During functional activity;Reliant on assistive device for balance Standing balance-Leahy Scale: Good     Cognition Arousal/Alertness: Awake/alert Behavior During Therapy: WFL for tasks assessed/performed Overall Cognitive Status: Within Functional Limits for tasks assessed      General Comments: Pt is A and O x 4        Exercises Total Joint Exercises  Goniometric ROM: R knee 6-70        Pertinent Vitals/Pain Pain Assessment: 0-10 Pain Score: 0-No pain Pain Location: R knee Pain Descriptors / Indicators: Aching;Grimacing;Guarding Pain Intervention(s): Limited activity within patient's tolerance;Monitored during  session;Premedicated before session;Repositioned;Ice applied     PT Goals (current goals can now be found in the care plan section) Acute Rehab PT Goals Patient Stated Goal: to go home when I leave the hospital Progress towards PT goals: Progressing toward goals    Frequency    BID      PT Plan Current plan remains appropriate       AM-PAC PT "6 Clicks" Mobility   Outcome Measure  Help needed turning from your back to your side while in a flat bed without using bedrails?: A Little Help needed moving from lying on your back to sitting on the side of a flat bed without using bedrails?: A Little Help needed moving to and from a bed to a chair (including a wheelchair)?: A Little Help needed standing up from a chair using your arms (e.g., wheelchair or bedside chair)?: A Little Help needed to walk in hospital room?: A Lot Help needed climbing 3-5 steps with a railing? : A Lot 6 Click Score: 16    End of Session Equipment Utilized During Treatment: Gait belt Activity Tolerance: Patient tolerated treatment well;Patient limited by pain Patient left: in chair;with call bell/phone within reach Nurse Communication: Mobility status PT Visit Diagnosis: Other abnormalities of gait and mobility (R26.89);Pain Pain - Right/Left: Right Pain - part of body: Knee     Time: 8208-1388 PT Time Calculation (min) (ACUTE ONLY): 26 min  Charges:  $Gait Training: 8-22 mins $Therapeutic Exercise: 8-22 mins                     Julaine Fusi PTA 12/17/21, 12:36 PM

## 2021-12-17 NOTE — Progress Notes (Signed)
Discharge order received. Patient mental status is at baseline. Vital signs stable . No signs of acute distress. Discharge instructions given to patient. Instructed patient that if wound vac has issues is to call Dr. Reeves Forth office) as soon as possible for recommendation. Patient verbalized understanding. No other issues noted at this time.

## 2021-12-17 NOTE — Anesthesia Postprocedure Evaluation (Signed)
Anesthesia Post Note  Patient: Monica Terry  Procedure(s) Performed: TOTAL KNEE ARTHROPLASTY (Right: Knee)  Patient location during evaluation: Nursing Unit Anesthesia Type: Spinal Level of consciousness: oriented and awake and alert Pain management: pain level controlled Vital Signs Assessment: post-procedure vital signs reviewed and stable Respiratory status: spontaneous breathing and respiratory function stable Cardiovascular status: blood pressure returned to baseline and stable Postop Assessment: no headache, no backache, no apparent nausea or vomiting and patient able to bend at knees Anesthetic complications: no   No notable events documented.   Last Vitals:  Vitals:   12/16/21 1605 12/16/21 2000  BP: (!) 157/81 (!) 178/90  Pulse: 80 88  Resp:  16  Temp: 36.4 C (!) 36.1 C  SpO2: 94% 100%    Last Pain:  Vitals:   12/17/21 0427  TempSrc:   PainSc: 10-Worst pain ever                 [facility]

## 2021-12-17 NOTE — Progress Notes (Signed)
° °  Subjective: 1 Day Post-Op Procedure(s) (LRB): TOTAL KNEE ARTHROPLASTY (Right) Patient reports pain as mild.   Patient is well, and has had no acute complaints or problems Denies any CP, SOB, ABD pain. We will continue therapy today.  Plan is to go Home after hospital stay.  Objective: Vital signs in last 24 hours: Temp:  [96.9 F (36.1 C)-97.6 F (36.4 C)] 96.9 F (36.1 C) (12/22 2000) Pulse Rate:  [71-94] 88 (12/22 2000) Resp:  [11-20] 16 (12/22 2000) BP: (106-178)/(50-90) 178/90 (12/22 2000) SpO2:  [94 %-100 %] 100 % (12/22 2000)  Intake/Output from previous day: 12/22 0701 - 12/23 0700 In: 1640 [P.O.:240; I.V.:1300; IV Piggyback:100] Out: 1920 [Urine:1900; Blood:20] Intake/Output this shift: No intake/output data recorded.  Recent Labs    12/16/21 1222 12/17/21 0418  HGB 12.2 12.5   Recent Labs    12/16/21 1222 12/17/21 0418  WBC 21.9* 17.0*  RBC 4.18 4.39  HCT 37.3 39.1  PLT 378 388   Recent Labs    12/16/21 1222 12/17/21 0418  NA  --  133*  K  --  4.5  CL  --  101  CO2  --  26  BUN  --  7  CREATININE 0.63 0.70  GLUCOSE  --  188*  CALCIUM  --  8.3*   No results for input(s): LABPT, INR in the last 72 hours.  EXAM General - Patient is Alert, Appropriate, and Oriented Extremity - Neurovascular intact Sensation intact distally Intact pulses distally Dorsiflexion/Plantar flexion intact No cellulitis present Compartment soft Dressing - dressing C/D/I and no drainage Motor Function - intact, moving foot and toes well on exam.   Past Medical History:  Diagnosis Date   Abnormal uterine bleeding    Anemia    Anxiety    Arthritis    NECK   Bell's palsy    right side   Bipolar 1 disorder (HCC)    Brittle bones    Complication of anesthesia    had to come back to ER after shoulder scope block breathing diffuculty   COPD (chronic obstructive pulmonary disease) (HCC)    Depression    Diabetes mellitus without complication (HCC)    GERD  (gastroesophageal reflux disease)    Heart murmur    Hepatitis 2016   c: treated by Dr. Allen Norris [facility])   Hypercholesteremia    Hypothyroidism    Meningitis 01/20/2017   Migraine headache    after meningitis.  none since starting nortriptyline   Neck pain    Neck pain, chronic    Panic attack    Sciatica    Shortness of breath dyspnea    Thyroid disease    Wears dentures    full upper    Assessment/Plan:   1 Day Post-Op Procedure(s) (LRB): TOTAL KNEE ARTHROPLASTY (Right) Principal Problem:   S/P TKR (total knee replacement) using cement, right  Estimated body mass index is 34.76 kg/m as calculated from the following:   Height as of this encounter: 5\' 8"  (1.727 m).   Weight as of this encounter: 103.7 kg. Advance diet Up with therapy Labs and VSS Pain controlled Work on BM CM to assist with discharge to home with HHPT pending completion of PT goals  DVT Prophylaxis - Lovenox, Foot Pumps, and TED hose Weight-Bearing as tolerated to right leg   T. Rachelle Hora, PA-C [place] 12/17/2021, 9:31 AM

## 2021-12-17 NOTE — TOC Progression Note (Signed)
Transition of Care [facility]) - Progression Note    Patient Details  Name: Monica Terry MRN: [id number] Date of Birth: 09-May-1966  Transition of Care [facility]) CM/SW [place], RN Phone Number: 12/17/2021, 3:34 PM  Clinical Narrative:   the patient has gotten DME less than 5 years ago and is unable to get DME at this time including 3 in 1 and RW    Expected Discharge Plan: Home/Self Care Barriers to Discharge: Continued Medical Work up  Expected Discharge Plan and Services Expected Discharge Plan: Home/Self Care       Living arrangements for the past 2 months: Apartment Expected Discharge Date: 12/17/21               DME Arranged: Gilford Rile rolling, 3-N-1 DME Agency: [employer] Date DME Agency Contacted: 12/16/21 Time DME Agency Contacted: [phone] Representative spoke with at DME Agency: Lou Cal Arranged: RN           Social Determinants of Health ([place]) Interventions    Readmission Risk Interventions No flowsheet data found.

## 2021-12-17 NOTE — Evaluation (Signed)
Occupational Therapy Evaluation Patient Details Name: Monica Terry MRN: [id number] DOB: 09-25-1966 Today's Date: 12/17/2021   History of Present Illness admitted for acute hospitalization s/p R TKR, WBAT (12/16/21).   Clinical Impression   Patient presenting with decreased Ind in self care, balance, functional mobility/transfers, endurance, and safety awareness. Patient reports living at home independently. She reports having assistance at discharge as needed. She has all needed equipment at home from prior surgery. OT reviewed polar care and LB dressing techniques as a refresher secondary to pt unable to remember details. Pt needing min guard for functional transfers with use of RW with cuing for technique. Min A for LB dressing. Patient will benefit from acute OT to increase overall independence in the areas of ADLs, functional mobility, and safety awareness in order to safely discharge to next venue of care.      Recommendations for follow up therapy are one component of a multi-disciplinary discharge planning process, led by the attending physician.  Recommendations may be updated based on patient status, additional functional criteria and insurance authorization.   Follow Up Recommendations  No OT follow up    Assistance Recommended at Discharge Intermittent Supervision/Assistance  Functional Status Assessment  Patient has had a recent decline in their functional status and demonstrates the ability to make significant improvements in function in a reasonable and predictable amount of time.  Equipment Recommendations  None recommended by OT;Other (comment) (Pt has all needed equipment)       Precautions / Restrictions Precautions Precautions: Fall Restrictions Weight Bearing Restrictions: Yes RLE Weight Bearing: Weight bearing as tolerated      Mobility Bed Mobility Overal bed mobility: Needs Assistance Bed Mobility: Supine to Sit     Supine to sit: Supervision      General bed mobility comments: pt was able to exit L side of bed with use of sheet assisting RLE to floor. pt sleep in recliner at baseline and plans to return to sleeping in recliner at home when [facility]    Transfers Overall transfer level: Needs assistance Equipment used: Rolling walker (2 wheels) Transfers: Sit to/from Stand Sit to Stand: Supervision;Min guard           General transfer comment: pt was able to stand from EOB and from transport chair with CGA-supervision. Vcs for increased BLE knee flex and fwd wt shift. Overall demonstrated safe technqiue and abilities. limited by pain      Balance Overall balance assessment: Needs assistance Sitting-balance support: Feet supported;No upper extremity supported Sitting balance-Leahy Scale: Good     Standing balance support: Bilateral upper extremity supported;During functional activity;Reliant on assistive device for balance Standing balance-Leahy Scale: Fair                             ADL either performed or assessed with clinical judgement   ADL Overall ADL's : Needs assistance/impaired                         Toilet Transfer: Min guard;Rolling walker (2 wheels) Toilet Transfer Details (indicate cue type and reason): simulated transfer         Functional mobility during ADLs: Min guard;Set up;Rolling walker (2 wheels)       Vision Patient Visual Report: No change from baseline              Pertinent Vitals/Pain Pain Assessment: No/denies pain Pain Score: 0-No pain Pain Location: R knee Pain  Descriptors / Indicators: Aching;Grimacing;Guarding Pain Intervention(s): Limited activity within patient's tolerance;Monitored during session;Premedicated before session;Ice applied;Repositioned     Hand Dominance Right   Extremity/Trunk Assessment Upper Extremity Assessment Upper Extremity Assessment: Overall WFL for tasks assessed   Lower Extremity Assessment Lower Extremity Assessment:  Defer to PT evaluation       Communication Communication Communication: No difficulties   Cognition Arousal/Alertness: Awake/alert Behavior During Therapy: WFL for tasks assessed/performed Overall Cognitive Status: Within Functional Limits for tasks assessed                                 General Comments: Pt is A and O x 4        Exercises Total Joint Exercises Goniometric ROM: R knee 6-70        Home Living Family/patient expects to be discharged to:: Private residence Living Arrangements: Spouse/significant other Available Help at Discharge: Family Type of Home: House Home Access: Ramped entrance     Home Layout: Multi-level Alternate Level Stairs-Number of Steps: 1 step down kitchen to bedroom   Bathroom Shower/Tub: Teacher, early years/pre: Standard     Home Equipment: Grab bars - tub/shower;Rolling Environmental consultant (2 wheels)          Prior Functioning/Environment Prior Level of Function : Independent/Modified Independent             Mobility Comments: Indep with ADLs, household and community mobilization          OT Problem List: Decreased strength;Decreased activity tolerance;Impaired balance (sitting and/or standing);Decreased safety awareness;Decreased range of motion;Pain      OT Treatment/Interventions: Self-care/ADL training;Therapeutic exercise;Therapeutic activities;Energy conservation;Neuromuscular education;DME and/or AE instruction;Patient/family education;Balance training    OT Goals(Current goals can be found in the care plan section) Acute Rehab OT Goals Patient Stated Goal: to go home OT Goal Formulation: With patient Time For Goal Achievement: 12/31/21 Potential to Achieve Goals: Fair ADL Goals Pt Will Perform Grooming: with modified independence;standing Pt Will Perform Lower Body Dressing: with modified independence;sit to/from stand Pt Will Transfer to Toilet: with modified independence;ambulating Pt Will  Perform Toileting - Clothing Manipulation and hygiene: with modified independence;sit to/from stand  OT Frequency: Min 2X/week   Barriers to D/C:    none known at this time          AM-PAC OT "6 Clicks" Daily Activity     Outcome Measure Help from another person eating meals?: None Help from another person taking care of personal grooming?: None Help from another person toileting, which includes using toliet, bedpan, or urinal?: A Little Help from another person bathing (including washing, rinsing, drying)?: A Little Help from another person to put on and taking off regular upper body clothing?: None Help from another person to put on and taking off regular lower body clothing?: A Little 6 Click Score: 21   End of Session Equipment Utilized During Treatment: Rolling walker (2 wheels) Nurse Communication: Mobility status  Activity Tolerance: Patient tolerated treatment well Patient left: with chair alarm set;with call bell/phone within reach  OT Visit Diagnosis: Unsteadiness on feet (R26.81);Repeated falls (R29.6);Muscle weakness (generalized) (M62.81)                Time: 1914-7829 OT Time Calculation (min): 16 min Charges:  OT General Charges $OT Visit: 1 Visit OT Evaluation $OT Eval Low Complexity: 1 Low OT Treatments $Therapeutic Activity: 8-22 mins  Darleen Crocker, MS, OTR/L , CBIS ascom [phone]  12/17/21,  12:51 PM

## 2021-12-17 NOTE — Discharge Instructions (Signed)

## 2022-02-18 ENCOUNTER — Other Ambulatory Visit: Payer: Self-pay

## 2022-02-18 ENCOUNTER — Emergency Department: Payer: Medicaid Other

## 2022-02-18 ENCOUNTER — Encounter: Payer: Self-pay | Admitting: Emergency Medicine

## 2022-02-18 ENCOUNTER — Emergency Department
Admission: EM | Admit: 2022-02-18 | Discharge: 2022-02-18 | Disposition: A | Discharge status: Home / Self Care | Payer: Medicaid Other | Attending: Emergency Medicine | Admitting: Emergency Medicine

## 2022-02-18 DIAGNOSIS — L03115 Cellulitis of right lower limb: Secondary | ICD-10-CM | POA: Insufficient documentation

## 2022-02-18 DIAGNOSIS — M7989 Other specified soft tissue disorders: Secondary | ICD-10-CM | POA: Diagnosis present

## 2022-02-18 DIAGNOSIS — I10 Essential (primary) hypertension: Secondary | ICD-10-CM

## 2022-02-18 DIAGNOSIS — E119 Type 2 diabetes mellitus without complications: Secondary | ICD-10-CM | POA: Insufficient documentation

## 2022-02-18 LAB — CBC WITH DIFFERENTIAL/PLATELET
Abs Immature Granulocytes: 0.02 10*3/uL (ref 0.00–0.07)
Basophils Absolute: 0.1 10*3/uL (ref 0.0–0.1)
Basophils Relative: 1 %
Eosinophils Absolute: 0.4 10*3/uL (ref 0.0–0.5)
Eosinophils Relative: 3 %
HCT: 40.9 % (ref 36.0–46.0)
Hemoglobin: 12.8 g/dL (ref 12.0–15.0)
Immature Granulocytes: 0 %
Lymphocytes Relative: 42 %
Lymphs Abs: 4.6 10*3/uL — ABNORMAL HIGH (ref 0.7–4.0)
MCH: 27.9 pg (ref 26.0–34.0)
MCHC: 31.3 g/dL (ref 30.0–36.0)
MCV: 89.3 fL (ref 80.0–100.0)
Monocytes Absolute: 0.6 10*3/uL (ref 0.1–1.0)
Monocytes Relative: 5 %
Neutro Abs: 5.4 10*3/uL (ref 1.7–7.7)
Neutrophils Relative %: 49 %
Platelets: 367 10*3/uL (ref 150–400)
RBC: 4.58 MIL/uL (ref 3.87–5.11)
RDW: 13.8 % (ref 11.5–15.5)
Smear Review: NORMAL
WBC: 11 10*3/uL — ABNORMAL HIGH (ref 4.0–10.5)
nRBC: 0 % (ref 0.0–0.2)

## 2022-02-18 LAB — C-REACTIVE PROTEIN: CRP: 2.1 mg/dL — ABNORMAL HIGH (ref <1.0)

## 2022-02-18 LAB — COMPREHENSIVE METABOLIC PANEL
ALT: 15 U/L (ref 0–44)
AST: 18 U/L (ref 15–41)
Albumin: 3.6 g/dL (ref 3.5–5.0)
Alkaline Phosphatase: 97 U/L (ref 38–126)
Anion gap: 9 (ref 5–15)
BUN: 15 mg/dL (ref 6–20)
CO2: 27 mmol/L (ref 22–32)
Calcium: 9.3 mg/dL (ref 8.9–10.3)
Chloride: 100 mmol/L (ref 98–111)
Creatinine, Ser: 0.67 mg/dL (ref 0.44–1.00)
GFR, Estimated: >60 mL/min (ref >60)
Glucose, Bld: 174 mg/dL — ABNORMAL HIGH (ref 70–99)
Potassium: 4.4 mmol/L (ref 3.5–5.1)
Sodium: 136 mmol/L (ref 135–145)
Total Bilirubin: 0.6 mg/dL (ref 0.3–1.2)
Total Protein: 6.9 g/dL (ref 6.5–8.1)

## 2022-02-18 LAB — SEDIMENTATION RATE: Sed Rate: 24 mm/hr (ref 0–30)

## 2022-02-18 LAB — CK: Total CK: 82 U/L (ref 38–234)

## 2022-02-18 MED ORDER — OXYCODONE HCL 5 MG PO TABS
10.0000 mg | ORAL_TABLET | ORAL | Status: AC
  Administered 2022-02-18: 10 mg via ORAL
  Filled 2022-02-18: qty 2

## 2022-02-18 MED ORDER — DIPHENHYDRAMINE HCL 25 MG PO CAPS
50.0000 mg | ORAL_CAPSULE | Freq: Once | ORAL | Status: AC
Start: 2022-02-18 — End: 2022-02-18
  Administered 2022-02-18: 50 mg via ORAL
  Filled 2022-02-18: qty 2

## 2022-02-18 MED ORDER — DOXYCYCLINE HYCLATE 100 MG PO CAPS: 100.0000 mg | ORAL_CAPSULE | Freq: Two times a day (BID) | ORAL | 0 refills | Status: AC

## 2022-02-18 MED ORDER — VANCOMYCIN HCL 2000 MG/400ML IV SOLN
2000.0000 mg | Freq: Once | INTRAVENOUS | Status: AC
  Administered 2022-02-18: 2000 mg via INTRAVENOUS
  Filled 2022-02-18: qty 400

## 2022-02-18 MED ORDER — DOXYCYCLINE HYCLATE 100 MG PO CAPS: 100.0000 mg | ORAL_CAPSULE | Freq: Two times a day (BID) | ORAL | 0 refills | Status: DC

## 2022-02-18 NOTE — ED Notes (Signed)
Sent med message for vanc to be sent to [facility] ED from pharmacy.

## 2022-02-18 NOTE — Consult Note (Signed)
PHARMACY -  BRIEF ANTIBIOTIC NOTE   Pharmacy has received consult(s) for vancomycin from an ED provider.  The patient's profile has been reviewed for ht/wt/allergies/indication/available labs.    One time order(s) placed for  Vancomycin 2000 mg   Further antibiotics/pharmacy consults should be ordered by admitting physician if indicated.                       Thank you, Dorothe Pea, PharmD, BCPS Clinical Pharmacist   02/18/2022  2:13 PM

## 2022-02-18 NOTE — ED Provider Notes (Addendum)
[facility] Provider Note    Event Date/Time   First MD Initiated Contact with Patient 02/18/22 1354     (approximate)   History   Leg Swelling   HPI  Monica Terry is a 56 y.o. female with a past medical history of osteoarthritis, DM, obesity, OSA, sciatica and chronic pain followed by pain clinic as well as recent S/P TKR 12/16/2021 presents for evaluation of some persistent pain redness and swelling around the right knee.  Patient states that she had a follow-up visit with her surgeon on February 3 and had some blood work done and was told that showed no infection but feels that she has not been getting any better.  She states it does hurt to range her leg but she does have good range of motion.  She states she completed a course of Keflex last month but did not feel it helped much.  She denies any fevers, chills, chest pain, cough, shortness of breath, nausea, vomiting, diarrhea or pain in the other extremities although she feels like her last room today she is developed a "knot in the back of her right leg.  No injuries or recent falls.  She has been taking her oxycodone Tylenol Motrin but does not feel this is helped that much.  No other acute concerns at this time.      Physical Exam  Triage Vital Signs: ED Triage Vitals  Enc Vitals Group     BP 02/18/22 1329 (!) 149/85     Pulse Rate 02/18/22 1329 84     Resp 02/18/22 1329 20     Temp 02/18/22 1329 98.1 F (36.7 C)     Temp Source 02/18/22 1329 Oral     SpO2 02/18/22 1329 98 %     Weight 02/18/22 1237 228 lb 9.9 oz (103.7 kg)     Height 02/18/22 1237 _0  (1.727 m)     Head Circumference --      Peak Flow --      Pain Score 02/18/22 1236 6     Pain Loc --      Pain Edu? --      Excl. in [place]? --     Most recent vital signs: Vitals:   02/18/22 1329  BP: (!) 149/85  Pulse: 84  Resp: 20  Temp: 98.1 F (36.7 C)  SpO2: 98%    General: Awake, no distress.  CV:  Good peripheral  perfusion.  Resp:  Normal effort.  Abd:  No distention.  Other:  Area of erythema tenderness and edema over the distal right upper leg proximal lower leg surrounding the knee.  Patient does have full range of motion at the knee.  Incision site itself appears clean dry intact.  There is no areas of fluctuance.   ED Results / Procedures / Treatments  Labs (all labs ordered are listed, but only abnormal results are displayed) Labs Reviewed  CBC WITH DIFFERENTIAL/PLATELET - Abnormal; Notable for the following components:      Result Value   WBC 11.0 (*)    Lymphs Abs 4.6 (*)    All other components within normal limits  COMPREHENSIVE METABOLIC PANEL - Abnormal; Notable for the following components:   Glucose, Bld 174 (*)    All other components within normal limits  CULTURE, BLOOD (ROUTINE X 2)  CULTURE, BLOOD (ROUTINE X 2)  CK  SEDIMENTATION RATE  C-REACTIVE PROTEIN     EKG    RADIOLOGY Right lower extremity (  on my interpretation shows no evidence of a DVT.  I also reviewed radiology interpretation and agree with her findings of no evidence of a DVT or other acute abnormality.  Plain film of the right knee on my interpretation shows hardware in place without any large effusion or fracture.  I also reviewed radiologist interpretation and agree the findings of no acute osseous abnormality although they do make notation of possibly a small effusion.   PROCEDURES:  Critical Care performed: No  Procedures    MEDICATIONS ORDERED IN ED: Medications  vancomycin (VANCOREADY) IVPB 2000 mg/400 mL (2,000 mg Intravenous New Bag/Given 02/18/22 1501)  oxyCODONE (Oxy IR/ROXICODONE) immediate release tablet 10 mg (10 mg Oral Given 02/18/22 1438)     IMPRESSION / MDM / ASSESSMENT AND PLAN / ED COURSE  I reviewed the triage vital signs and the nursing notes.                              Differential diagnosis includes, but is not limited to cellulitis, myositis, abscess and possible  possible septic joint.  Right lower extremity (on my interpretation shows no evidence of a DVT.  I also reviewed radiology interpretation and agree with her findings of no evidence of a DVT or other acute abnormality.  Plain film of the right knee on my interpretation shows hardware in place without any large effusion or fracture.  I also reviewed radiologist interpretation and agree the findings of no acute osseous abnormality although they do make notation of possibly a small effusion.   CBC with WBC count of 11 without evidence of acute anemia.  CMP shows a glucose of 174 without any other significant lecture light or metabolic derangements.  CK is unremarkable and not consistent with myositis.  ESR is within normal limits.  Overall exam is most consistent with a cellulitis.  Given absence of fever, normal ESR, white count less than 12,000 patient able to range her knee fully with only very small effusion x-ray of the lower suspicion for a septic joint at this time.  I do not believe she is septic.  She was given a one-time dose of IV antibiotics while undergoing initial work-up.  However prior to completing this she developed itchiness and redness around the site.  Infusion was stopped and she was given some Benadryl.  My assessment I do not think she is anaphylactic to have a small localized skin reaction.  In addition blood cultures were obtained she states this is been ongoing for over a month.  At this point I think she is stable for discharge with continued outpatient evaluation.  Advised to have her wound rechecked in 7 days by PCP.  Margin was marked with marking pen.  Advised to continue ranging as tolerated she has been walking on it bite with a small limp using her cane.  Discharged in stable condition.  Strict return precautions advised and discussed including returning for any spreading redness, fever, worsening pain or inability to range her knee.  FINAL CLINICAL IMPRESSION(S) / ED DIAGNOSES    Final diagnoses:  Cellulitis of right lower extremity     Rx / DC Orders   ED Discharge Orders          Ordered    doxycycline (VIBRAMYCIN) 100 MG capsule  2 times daily        02/18/22 1552             Note:  This  document was prepared using Systems analyst and may include unintentional dictation errors.   Lucrezia Starch, MD 02/18/22 1556    Lucrezia Starch, MD 02/18/22 [phone]

## 2022-02-18 NOTE — ED Notes (Signed)
Pt to ED for R knee redness and pain, had knee surgery in December, has been painful ever since.

## 2022-02-18 NOTE — ED Triage Notes (Signed)
Pt comes into the ED via POV c/o right upper leg swelling above her new knee replacement.  Pt was sent over to r/o DVT.  Pt had right knee replacement 1 month ago and has been wearing her compression stockings, but now has swelling and a "knot" present above where the stockings stop.  Pt denies any SHOB and is in NAD.

## 2022-02-18 NOTE — ED Notes (Signed)
Provider reassessed pt again. Pt may be discharged at this time.

## 2022-02-18 NOTE — ED Notes (Signed)
Provider to desk, states pt had itching and redness related to Vancomycin infusion. Infusion stopped. Pt denies shortness of breath.

## 2022-02-23 LAB — CULTURE, BLOOD (ROUTINE X 2)
Culture: NO GROWTH
Culture: NO GROWTH
Special Requests: ADEQUATE

## 2022-03-16 ENCOUNTER — Other Ambulatory Visit: Payer: Self-pay | Admitting: Physician Assistant

## 2022-03-16 DIAGNOSIS — Z1231 Encounter for screening mammogram for malignant neoplasm of breast: Secondary | ICD-10-CM

## 2022-05-06 ENCOUNTER — Other Ambulatory Visit: Payer: Self-pay | Admitting: *Deleted

## 2022-05-06 DIAGNOSIS — Z122 Encounter for screening for malignant neoplasm of respiratory organs: Secondary | ICD-10-CM

## 2022-05-06 DIAGNOSIS — Z87891 Personal history of nicotine dependence: Secondary | ICD-10-CM

## 2022-05-24 ENCOUNTER — Ambulatory Visit
Admission: RE | Admit: 2022-05-24 | Discharge: 2022-05-24 | Disposition: A | Discharge status: Home / Self Care | Payer: Medicaid Other | Source: Ambulatory Visit | Attending: Acute Care | Admitting: Acute Care

## 2022-05-24 ENCOUNTER — Ambulatory Visit
Admission: RE | Admit: 2022-05-24 | Discharge: 2022-05-24 | Disposition: A | Discharge status: Home / Self Care | Payer: Medicaid Other | Source: Ambulatory Visit | Attending: Physician Assistant | Admitting: Physician Assistant

## 2022-05-24 ENCOUNTER — Encounter: Payer: Self-pay | Admitting: Acute Care

## 2022-05-24 ENCOUNTER — Ambulatory Visit (INDEPENDENT_AMBULATORY_CARE_PROVIDER_SITE_OTHER): Payer: Medicaid Other | Admitting: Acute Care

## 2022-05-24 DIAGNOSIS — Z1231 Encounter for screening mammogram for malignant neoplasm of breast: Secondary | ICD-10-CM | POA: Diagnosis present

## 2022-05-24 DIAGNOSIS — Z87891 Personal history of nicotine dependence: Secondary | ICD-10-CM

## 2022-05-24 DIAGNOSIS — Z122 Encounter for screening for malignant neoplasm of respiratory organs: Secondary | ICD-10-CM | POA: Insufficient documentation

## 2022-05-24 NOTE — Progress Notes (Signed)
Virtual Visit via Telephone Note  I connected with Monica Terry on 11/09/21 at  2:00 PM EST by telephone and verified that I am speaking with the correct person using two identifiers.  Location: Patient: Home Provider: Working from home   I discussed the limitations, risks, security and privacy concerns of performing an evaluation and management service by telephone and the availability of in person appointments. I also discussed with the patient that there may be a patient responsible charge related to this service. The patient expressed understanding and agreed to proceed.  Shared Decision Making Visit Lung Cancer Screening Program [phone])   Eligibility: Age 56 y.o. Pack Years Smoking History Calculation 40 (# packs/per year x # years smoked) Recent History of coughing up blood  no Unexplained weight loss? no ( >Than 15 pounds within the last 6 months ) Prior History Lung / other cancer no (Diagnosis within the last 5 years already requiring surveillance chest CT Scans). Smoking Status Former Smoker Former Smokers: Years since quit: 7 years  Quit Date: 2016  Visit Components: Discussion included one or more decision making aids. yes Discussion included risk/benefits of screening. yes Discussion included potential follow up diagnostic testing for abnormal scans. yes Discussion included meaning and risk of over diagnosis. yes Discussion included meaning and risk of False Positives. yes Discussion included meaning of total radiation exposure. yes  Counseling Included: Importance of adherence to annual lung cancer LDCT screening. yes Impact of comorbidities on ability to participate in the program. yes Ability and willingness to under diagnostic treatment. yes  Smoking Cessation Counseling: Current Smokers:  Discussed importance of smoking cessation. yes Information about tobacco cessation classes and interventions provided to patient. yes Patient provided with "ticket"  for LDCT Scan. yes Symptomatic Patient. no  Counseling NA Diagnosis Code: Tobacco Use Z72.0 Asymptomatic Patient yes  Counseling NA Former Smokers:  Discussed the importance of maintaining cigarette abstinence. yes Diagnosis Code: Personal History of Nicotine Dependence. K27.062 Information about tobacco cessation classes and interventions provided to patient. Yes Patient provided with "ticket" for LDCT Scan. yes Written Order for Lung Cancer Screening with LDCT placed in Epic. Yes (CT Chest Lung Cancer Screening Low Dose W/O CM) [id number] Z12.2-Screening of respiratory organs Z87.891-Personal history of nicotine dependence   I spent 25 minutes of face to face time with her discussing the risks and benefits of lung cancer screening. We viewed a power point together that explained in detail the above noted topics. We took the time to pause the power point at intervals to allow for questions to be asked and answered to ensure understanding. We discussed that he had taken the single most powerful action possible to decrease her risk of developing lung cancer when he quit smoking. I counseled her to remain smoke free, and to contact me if she ever had the desire to smoke again so that I can provide resources and tools to help support the effort to remain smoke free. We discussed the time and location of the scan, and that either  Doroteo Glassman RN or I will call with the results within  24-48 hours of receiving them. She has my card and contact information in the event she needs to speak with me, in addition to a copy of the power point we reviewed as a resource. She verbalized understanding of all of the above and had no further questions upon leaving the office.     I explained to the patient that there has been a high incidence of coronary  artery disease noted on these exams. I explained that this is a non-gated exam therefore degree or severity cannot be determined. This patient is on statin  therapy. I have asked the patient to follow-up with their PCP regarding any incidental finding of coronary artery disease and management with diet or medication as they feel is clinically indicated. The patient verbalized understanding of the above and had no further questions.    Jacklyne Baik D. Kenton Kingfisher, NP-C [facility] Personal contact information can be found on Amion  05/24/2022, 11:08 AM

## 2022-05-24 NOTE — Patient Instructions (Signed)
Thank you for participating in the [facility]. It was our pleasure to meet you today. We will call you with the results of your scan within the next few days. Your scan will be assigned a Lung RADS category score by the physicians reading the scans.  This Lung RADS score determines follow up scanning.  See below for description of categories, and follow up screening recommendations. We will be in touch to schedule your follow up screening annually or based on recommendations of our providers. We will fax a copy of your scan results to your Primary Care Physician, or the physician who referred you to the program, to ensure they have the results. Please call the office if you have any questions or concerns regarding your scanning experience or results.  Our office number is [phone]. Please speak with Denise Phelps, RN. , or  Denise Buckner RN, They are  our Lung Cancer Screening RN.'s If They are unavailable when you call, Please leave a message on the voice mail. We will return your call at our earliest convenience.This voice mail is monitored several times a day.  Remember, if your scan is normal, we will scan you annually as long as you continue to meet the criteria for the program. (Age 55-77, Current smoker or smoker who has quit within the last 15 years). If you are a smoker, remember, quitting is the single most powerful action that you can take to decrease your risk of lung cancer and other pulmonary, breathing related problems. We know quitting is hard, and we are here to help.  Please let us know if there is anything we can do to help you meet your goal of quitting. If you are a former smoker, congratulations. We are proud of you! Remain smoke free! Remember you can refer friends or family members through the number above.  We will screen them to make sure they meet criteria for the program. Thank you for helping us take better care of you by  participating in Lung Screening.  You can receive free nicotine replacement therapy ( patches, gum or mints) by calling 1-800-QUIT NOW. Please call so we can get you on the path to becoming  a non-smoker. I know it is hard, but you can do this!  Lung RADS Categories:  Lung RADS 1: no nodules or definitely non-concerning nodules.  Recommendation is for a repeat annual scan in 12 months.  Lung RADS 2:  nodules that are non-concerning in appearance and behavior with a very low likelihood of becoming an active cancer. Recommendation is for a repeat annual scan in 12 months.  Lung RADS 3: nodules that are probably non-concerning , includes nodules with a low likelihood of becoming an active cancer.  Recommendation is for a 6-month repeat screening scan. Often noted after an upper respiratory illness. We will be in touch to make sure you have no questions, and to schedule your 6-month scan.  Lung RADS 4 A: nodules with concerning findings, recommendation is most often for a follow up scan in 3 months or additional testing based on our provider's assessment of the scan. We will be in touch to make sure you have no questions and to schedule the recommended 3 month follow up scan.  Lung RADS 4 B:  indicates findings that are concerning. We will be in touch with you to schedule additional diagnostic testing based on our provider's  assessment of the scan.  Other options for assistance in smoking cessation (   As covered by your insurance benefits)  Hypnosis for smoking cessation  [employer] [phone]  Acupuncture for smoking cessation  [facility] [phone]   

## 2022-05-26 ENCOUNTER — Other Ambulatory Visit: Payer: Self-pay | Admitting: Acute Care

## 2022-05-26 ENCOUNTER — Inpatient Hospital Stay
Admission: RE | Admit: 2022-05-26 | Discharge: 2022-05-26 | Disposition: A | Discharge status: Home / Self Care | Payer: Self-pay | Source: Ambulatory Visit | Attending: *Deleted | Admitting: *Deleted

## 2022-05-26 ENCOUNTER — Other Ambulatory Visit: Payer: Self-pay | Admitting: *Deleted

## 2022-05-26 DIAGNOSIS — Z1231 Encounter for screening mammogram for malignant neoplasm of breast: Secondary | ICD-10-CM

## 2022-05-26 DIAGNOSIS — Z87891 Personal history of nicotine dependence: Secondary | ICD-10-CM

## 2022-05-26 DIAGNOSIS — Z122 Encounter for screening for malignant neoplasm of respiratory organs: Secondary | ICD-10-CM

## 2022-05-31 ENCOUNTER — Telehealth: Payer: Self-pay | Admitting: Acute Care

## 2022-05-31 NOTE — Telephone Encounter (Signed)
We mailed her result letter to her on 05/26/2022 via [employer]. Hopefully she has received it by now. She was a Lung Rads 2.

## 2022-05-31 NOTE — Telephone Encounter (Signed)
Called and spoke with patient She is scheduled for enrollment on 06/03/2022  Nothing further needed at this time.

## 2022-06-03 ENCOUNTER — Encounter: Payer: Medicaid Other | Admitting: *Deleted

## 2022-06-03 DIAGNOSIS — Z006 Encounter for examination for normal comparison and control in clinical research program: Secondary | ICD-10-CM

## 2022-06-03 DIAGNOSIS — R911 Solitary pulmonary nodule: Secondary | ICD-10-CM

## 2022-06-03 NOTE — Research (Signed)
Title: NIGHTINGALE: CliNIcal Utility of ManaGement of Patients witH CT and LDCT Identified Pulmonary Nodules UsinG the Percepta NasAL Swab ClassifiEr -- with Familiarization   Protocol #: [id number] Sponsor: [employer]   Protocol Revision 1 dated 01Sep2022 and confirmed current on today's visit, IRB approved Revision 1 on 29Dec2022.   Objectives:  Primary: To evaluate if use of the Percepta Nasal Swab test in the diagnostic work up of newly identified pulmonary nodules reduces the number of invasive procedures in the group classified as low-risk by the test and that are benign as compared to a control group managed without a Percepta Nasal Swab test result.                   A newly identified nodule is defined as any nodule first identified on imaging                   <90 days prior to nasal sample collection that hasn't undergone a diagnostic                    procedure for the management of their index nodule prior to enrollment.                   CT imaging includes conventional CT, LDCT, HRCT                   Benign diagnosis is defined as a specific diagnosis of a benign condition,                    radiographic resolution or stability at ? 24 months, or no cytological,                    radiological, or pathological evidence of cancer.                   Procedures will be categorized as either invasive or non-invasive in the Data                    Management Plan (DMP). Secondary: To evaluate if use of the Percepta Nasal Swab test in the diagnostic work up of newly identified pulmonary nodules increases the proportion of subjects classified as high-risk by the test and have primary lung cancer that go directly to appropriate therapy as compared to a control group managed without a Percepta Nasal Swab test result.                    Proportion of subjects that go directly to appropriate therapy is defined as those                     subjects that undergo surgery, ablative  or other appropriate therapy as the next                     step after the Percepta Nasal Swab test result without intervening non-surgical                     procedures                             a. Non-surgical procedures include diagnostic PET, but not PET for                                   staging purposes.                             b. Appropriate therapies will be defined in the CRF.                    A newly identified nodule is defined as any nodule first identified on imaging                    <90 days prior to nasal sample collection.                    Lung cancer diagnosis is defined as established by cytology or pathology, or in                     circumstances where a presumptive diagnosis of cancer led to definitive                     ablative or other appropriate therapy without pathology. Key Inclusion Criteria:  Inclusion Criteria:  Able to tolerate nasal epithelial specimen collection  Signed written Informed Consent obtained  Subject clinical history available for review by sponsor and regulatory agencies  New nodule first identified on imaging < 90 days prior to nasal sample collection (index nodule)  CT report available for index nodule  72 - 23 years of age  Current or former smoker (>100 cigarettes in a lifetime)  Pulmonary nodule ?30 mm detected by CT  Key Exclusion Criteria: Exclusion Criteria  Subject has undergone a diagnostic procedure for the management of their index nodule after the index CT and prior to enrollment  Active cancer (other than non-melanoma skin cancer)  Prior primary lung cancer (prior non-lung cancer acceptable)  Prior participation in this study (i.e., subjects may not be enrolled more than once)  Current active treatment with an investigational device or drug (patients in trial follow up period are okay if intervention phase is complete)  Patient enrolled or planned to be enrolled in another clinical trial that may influence  management of the patient's nodule  Concurrent or planned use of tools or tests for assigning lung nodule risk of malignancy (e.g., genomic or proteomic blood tests) other than clinically validated risk calculators  Clinical Research Coordinator / Research RN note : This visit is for enrollment /baseline Subject [id number] with DOB: [phone] on 09Jun2023 for the above protocol is an Enrollment Visit and is for purpose of research.    Subject expressed interest and consent in continuing as a study subject. Subject confirmed contact information (e.g. address, telephone, email). Subject thanked for participation in research and contribution to science.     During this visit on 09jun2023  , the subject reviewed and signed the consent form, provided demographics, and had a nasal swab collected per the above referenced protocol. Please refer to the subject's paper source binder for further details.   The PI and Sub-I met/discussed the subject prior to consenting patient.  The sub-Investigator  Eric Form NP was present for the consenting process.         Signed by Jaye Beagle RN, CRN II

## 2023-02-21 ENCOUNTER — Telehealth: Payer: Self-pay

## 2023-02-21 NOTE — Telephone Encounter (Signed)
Returned call to patient from VM requesting to schedule annual LDCT. Due 05/26/23.  Patients phone line was busy and unable to leave a message

## 2023-03-27 ENCOUNTER — Other Ambulatory Visit: Payer: Self-pay | Admitting: Physician Assistant

## 2023-03-27 DIAGNOSIS — Z1231 Encounter for screening mammogram for malignant neoplasm of breast: Secondary | ICD-10-CM

## 2023-04-23 ENCOUNTER — Telehealth: Payer: Medicaid Other | Admitting: Urgent Care

## 2023-04-23 DIAGNOSIS — H109 Unspecified conjunctivitis: Secondary | ICD-10-CM | POA: Diagnosis not present

## 2023-04-23 MED ORDER — MOXIFLOXACIN HCL 0.5 % OP SOLN: 1.0000 [drp] | Freq: Three times a day (TID) | OPHTHALMIC | 0 refills | Status: AC

## 2023-04-23 NOTE — Progress Notes (Signed)
E-Visit for [employer]   We are sorry that you are not feeling well.  Here is how we plan to help!  Based on what you have shared with me it looks like you have conjunctivitis.  Conjunctivitis is a common inflammatory or infectious condition of the eye that is often referred to as "pink eye".  In most cases it is contagious (viral or bacterial). However, not all conjunctivitis requires antibiotics (ex. Allergic).  We have made appropriate suggestions for you based upon your presentation.  I have prescribed Vigamox eye drops. Since it is starting in the left, I would treat both eyes. Drop 1 drop 3 times a day times 5-7 days. Please purchase Laural Benes and [employer] baby shampoo (the yellow bottle) and put a small amount on a warm damp cloth to gently cleanse the eyelids.   Pink eye can be highly contagious.  It is typically spread through direct contact with secretions, or contaminated objects or surfaces that one may have touched.  Strict handwashing is suggested with soap and water is urged.  If not available, use alcohol based had sanitizer.  Avoid unnecessary touching of the eye.  If you wear contact lenses, you will need to refrain from wearing them until you see no white discharge from the eye for at least 24 hours after being on medication.  You should see symptom improvement in 1-2 days after starting the medication regimen.  Call us if symptoms are not improved in 1-2 days.  Home Care: Wash your hands often! Do not wear your contacts until you complete your treatment plan. Avoid sharing towels, bed linen, personal items with a person who has pink eye. See attention for anyone in your home with similar symptoms.  Get Help Right Away If: Your symptoms do not improve. You develop blurred or loss of vision. Your symptoms worsen (increased discharge, pain or redness)   Thank you for choosing an e-visit.  Your e-visit answers were reviewed by a board certified advanced clinical practitioner to  complete your personal care plan. Depending upon the condition, your plan could have included both over the counter or prescription medications.  Please review your pharmacy choice. Make sure the pharmacy is open so you can pick up prescription now. If there is a problem, you may contact your provider through [employer] and have the prescription routed to another pharmacy.  Your safety is important to [place]. If you have drug allergies check your prescription carefully.   For the next 24 hours you can use MyChart to ask questions about today's visit, request a non-urgent call back, or ask for a work or school excuse. You will get an email in the next two days asking about your experience. I hope that your e-visit has been valuable and will speed your recovery.   I have spent 5 minutes in review of e-visit questionnaire, review and updating patient chart, medical decision making and response to patient.   Sherena Machorro L Jasani Dolney, PA

## 2023-05-25 ENCOUNTER — Ambulatory Visit
Admission: RE | Admit: 2023-05-25 | Discharge: 2023-05-25 | Disposition: A | Discharge status: Home / Self Care | Payer: Medicaid Other | Source: Ambulatory Visit | Attending: Physician Assistant | Admitting: Physician Assistant

## 2023-05-25 DIAGNOSIS — I700 Atherosclerosis of aorta: Secondary | ICD-10-CM | POA: Insufficient documentation

## 2023-05-25 DIAGNOSIS — Z122 Encounter for screening for malignant neoplasm of respiratory organs: Secondary | ICD-10-CM | POA: Insufficient documentation

## 2023-05-25 DIAGNOSIS — Z87891 Personal history of nicotine dependence: Secondary | ICD-10-CM | POA: Diagnosis present

## 2023-05-25 DIAGNOSIS — J439 Emphysema, unspecified: Secondary | ICD-10-CM | POA: Diagnosis not present

## 2023-05-26 ENCOUNTER — Ambulatory Visit
Admission: RE | Admit: 2023-05-26 | Discharge: 2023-05-26 | Disposition: A | Discharge status: Home / Self Care | Payer: Medicaid Other | Source: Ambulatory Visit | Attending: Physician Assistant | Admitting: Physician Assistant

## 2023-05-26 DIAGNOSIS — Z1231 Encounter for screening mammogram for malignant neoplasm of breast: Secondary | ICD-10-CM | POA: Insufficient documentation

## 2023-05-30 ENCOUNTER — Other Ambulatory Visit: Payer: Self-pay

## 2023-05-30 DIAGNOSIS — Z87891 Personal history of nicotine dependence: Secondary | ICD-10-CM

## 2023-05-30 DIAGNOSIS — Z122 Encounter for screening for malignant neoplasm of respiratory organs: Secondary | ICD-10-CM

## 2023-06-08 ENCOUNTER — Other Ambulatory Visit: Payer: Self-pay

## 2023-06-08 ENCOUNTER — Emergency Department
Admission: EM | Admit: 2023-06-08 | Discharge: 2023-06-08 | Disposition: A | Discharge status: Home / Self Care | Payer: Medicaid Other | Attending: Emergency Medicine | Admitting: Emergency Medicine

## 2023-06-08 DIAGNOSIS — R350 Frequency of micturition: Secondary | ICD-10-CM | POA: Insufficient documentation

## 2023-06-08 LAB — URINALYSIS, ROUTINE W REFLEX MICROSCOPIC
Bilirubin Urine: NEGATIVE
Glucose, UA: NEGATIVE mg/dL
Hgb urine dipstick: NEGATIVE
Ketones, ur: NEGATIVE mg/dL
Leukocytes,Ua: NEGATIVE
Nitrite: NEGATIVE
Protein, ur: NEGATIVE mg/dL
Specific Gravity, Urine: 1.028 (ref 1.005–1.030)
pH: 5 (ref 5.0–8.0)

## 2023-06-08 MED ORDER — CEPHALEXIN 500 MG PO CAPS
500.0000 mg | ORAL_CAPSULE | Freq: Once | ORAL | Status: AC
  Administered 2023-06-08: 500 mg via ORAL
  Filled 2023-06-08: qty 1

## 2023-06-08 MED ORDER — OXYBUTYNIN CHLORIDE 5 MG PO TABS
2.5000 mg | ORAL_TABLET | Freq: Once | ORAL | Status: AC
  Administered 2023-06-08: 2.5 mg via ORAL
  Filled 2023-06-08: qty 0.5

## 2023-06-08 MED ORDER — CEPHALEXIN 500 MG PO CAPS: 500.0000 mg | ORAL_CAPSULE | Freq: Four times a day (QID) | ORAL | 0 refills | Status: AC

## 2023-06-08 MED ORDER — OXYBUTYNIN CHLORIDE 5 MG PO TABS: 2.5000 mg | ORAL_TABLET | Freq: Two times a day (BID) | ORAL | 0 refills | Status: AC

## 2023-06-08 NOTE — ED Provider Notes (Signed)
[facility] Provider Note  Patient Contact: 9:10 PM (approximate)   History   Urinary Frequency   HPI  Monica Terry is a 57 y.o. female with a history of recently diagnosed UTI currently on Macrobid, presents to the emergency department with persistent increased urinary frequency.  Patient reports that her frequency is worse when she is lying completely supine or when she stands.  She reports that this increased urinary frequency is keeping her from sleeping at night.  She states that her dysuria has improved.  She is not having any hematuria or fever.  No nausea or vomiting.      Physical Exam   Triage Vital Signs: ED Triage Vitals  Enc Vitals Group     BP 06/08/23 2028 (!) 144/73     Pulse Rate 06/08/23 2028 89     Resp 06/08/23 2028 18     Temp 06/08/23 2028 98.2 F (36.8 C)     Temp Source 06/08/23 2028 Oral     SpO2 06/08/23 2028 98 %     Weight 06/08/23 2029 193 lb (87.5 kg)     Height 06/08/23 2029 5\' 8"  (1.727 m)     Head Circumference --      Peak Flow --      Pain Score 06/08/23 2029 0     Pain Loc --      Pain Edu? --      Excl. in GC? --     Most recent vital signs: Vitals:   06/08/23 2028  BP: (!) 144/73  Pulse: 89  Resp: 18  Temp: 98.2 F (36.8 C)  SpO2: 98%     General: Alert and in no acute distress. Eyes:  PERRL. EOMI. Head: No acute traumatic findings ENT:      Nose: No congestion/rhinnorhea.      Mouth/Throat: Mucous membranes are moist. Neck: No stridor. No cervical spine tenderness to palpation. Cardiovascular:  Good peripheral perfusion Respiratory: Normal respiratory effort without tachypnea or retractions. Lungs CTAB. Good air entry to the bases with no decreased or absent breath sounds. Gastrointestinal: Bowel sounds 4 quadrants. Soft and nontender to palpation. No guarding or rigidity. No palpable masses. No distention. No CVA tenderness. Musculoskeletal: Full range of motion to all extremities.   Neurologic:  No gross focal neurologic deficits are appreciated.  Skin:   No rash noted    ED Results / Procedures / Treatments   Labs (all labs ordered are listed, but only abnormal results are displayed) Labs Reviewed  URINALYSIS, ROUTINE W REFLEX MICROSCOPIC - Abnormal; Notable for the following components:      Result Value   Color, Urine YELLOW (*)    APPearance HAZY (*)    All other components within normal limits  URINE CULTURE       PROCEDURES:  Critical Care performed: No  Procedures   MEDICATIONS ORDERED IN ED: Medications  cephALEXin (KEFLEX) capsule 500 mg (has no administration in time range)  oxybutynin (DITROPAN) tablet 2.5 mg (has no administration in time range)     IMPRESSION / MDM / ASSESSMENT AND PLAN / ED COURSE  I reviewed the triage vital signs and the nursing notes.                              Assessment and plan Urinary frequency 58 year old female presents to the emergency department with persistent increased urinary frequency.  Vital signs are reassuring at triage.  On exam, patient was alert, active and nontoxic-appearing.  Urinalysis reassuring and urine culture in process.  Will transition patient from Macrobid to Keflex and will prescribe her a short course of oxybutynin to see if this helps with frequency.  Return precautions were given to return with new or worsening symptoms.  All patient questions were answered.      FINAL CLINICAL IMPRESSION(S) / ED DIAGNOSES   Final diagnoses:  Urinary frequency     Rx / DC Orders   ED Discharge Orders          Ordered    cephALEXin (KEFLEX) 500 MG capsule  4 times daily        06/08/23 2110    oxybutynin (DITROPAN) 5 MG tablet  2 times daily        06/08/23 2110             Note:  This document was prepared using Dragon voice recognition software and may include unintentional dictation errors.   Gasper Lloyd 06/08/23 2114    Jene Every, MD 06/09/23  [phone]

## 2023-06-08 NOTE — ED Triage Notes (Signed)
Pt to ED via POV c/o urinary frequency/urgency. Pt was given antibiotics for UTI, says burning sensation has gone away but still having to go to bathroom a lot. Pt started antibiotics on Monday. Pt denies CP, SOB, fevers, N/V

## 2023-06-08 NOTE — Discharge Instructions (Signed)
You can take half tablet of oxybutynin up to twice daily for the next 5 days to help with increased urinary frequency. You can take Keflex 4 times daily for the next 7 days. Your urine culture is in process.

## 2023-06-09 ENCOUNTER — Other Ambulatory Visit: Payer: Self-pay | Admitting: Orthopedic Surgery

## 2023-06-09 DIAGNOSIS — G8929 Other chronic pain: Secondary | ICD-10-CM

## 2023-06-10 LAB — URINE CULTURE: Culture: NO GROWTH

## 2023-06-20 ENCOUNTER — Ambulatory Visit: Payer: Medicaid Other

## 2023-07-06 ENCOUNTER — Ambulatory Visit
Admission: RE | Admit: 2023-07-06 | Discharge: 2023-07-06 | Disposition: A | Discharge status: Home / Self Care | Payer: Medicaid Other | Source: Ambulatory Visit | Attending: Orthopedic Surgery | Admitting: Orthopedic Surgery

## 2023-07-06 DIAGNOSIS — G8929 Other chronic pain: Secondary | ICD-10-CM

## 2023-08-07 ENCOUNTER — Other Ambulatory Visit: Payer: Self-pay

## 2023-08-07 ENCOUNTER — Emergency Department: Payer: Medicaid Other

## 2023-08-07 ENCOUNTER — Observation Stay
Admission: EM | Admit: 2023-08-07 | Discharge: 2023-08-08 | Disposition: A | Discharge status: Home / Self Care | Payer: Medicaid Other | Attending: General Surgery | Admitting: General Surgery

## 2023-08-07 ENCOUNTER — Observation Stay: Payer: Medicaid Other | Admitting: Anesthesiology

## 2023-08-07 ENCOUNTER — Encounter
Admission: EM | Disposition: A | Discharge status: Home / Self Care | Payer: Self-pay | Source: Home / Self Care | Attending: Emergency Medicine

## 2023-08-07 DIAGNOSIS — Z87891 Personal history of nicotine dependence: Secondary | ICD-10-CM | POA: Insufficient documentation

## 2023-08-07 DIAGNOSIS — K8020 Calculus of gallbladder without cholecystitis without obstruction: Principal | ICD-10-CM

## 2023-08-07 DIAGNOSIS — Z79899 Other long term (current) drug therapy: Secondary | ICD-10-CM | POA: Diagnosis not present

## 2023-08-07 DIAGNOSIS — R7989 Other specified abnormal findings of blood chemistry: Secondary | ICD-10-CM | POA: Diagnosis not present

## 2023-08-07 DIAGNOSIS — I10 Essential (primary) hypertension: Secondary | ICD-10-CM | POA: Diagnosis not present

## 2023-08-07 DIAGNOSIS — J449 Chronic obstructive pulmonary disease, unspecified: Secondary | ICD-10-CM | POA: Diagnosis not present

## 2023-08-07 DIAGNOSIS — R1011 Right upper quadrant pain: Secondary | ICD-10-CM

## 2023-08-07 DIAGNOSIS — E039 Hypothyroidism, unspecified: Secondary | ICD-10-CM | POA: Insufficient documentation

## 2023-08-07 DIAGNOSIS — K810 Acute cholecystitis: Secondary | ICD-10-CM | POA: Diagnosis not present

## 2023-08-07 DIAGNOSIS — Z96653 Presence of artificial knee joint, bilateral: Secondary | ICD-10-CM | POA: Insufficient documentation

## 2023-08-07 DIAGNOSIS — Z7984 Long term (current) use of oral hypoglycemic drugs: Secondary | ICD-10-CM | POA: Insufficient documentation

## 2023-08-07 DIAGNOSIS — E119 Type 2 diabetes mellitus without complications: Secondary | ICD-10-CM | POA: Insufficient documentation

## 2023-08-07 DIAGNOSIS — K8010 Calculus of gallbladder with chronic cholecystitis without obstruction: Principal | ICD-10-CM | POA: Insufficient documentation

## 2023-08-07 HISTORY — PX: ROBOTIC ASSISTED LAPAROSCOPIC CHOLECYSTECTOMY: SHX6521

## 2023-08-07 LAB — URINALYSIS, ROUTINE W REFLEX MICROSCOPIC
Bilirubin Urine: NEGATIVE
Glucose, UA: 50 mg/dL — AB
Hgb urine dipstick: NEGATIVE
Ketones, ur: NEGATIVE mg/dL
Leukocytes,Ua: NEGATIVE
Nitrite: NEGATIVE
Protein, ur: NEGATIVE mg/dL
Specific Gravity, Urine: 1.016 (ref 1.005–1.030)
pH: 8 (ref 5.0–8.0)

## 2023-08-07 LAB — GLUCOSE, CAPILLARY
Glucose-Capillary: 73 mg/dL (ref 70–99)
Glucose-Capillary: 115 mg/dL — ABNORMAL HIGH (ref 70–99)
Glucose-Capillary: 172 mg/dL — ABNORMAL HIGH (ref 70–99)

## 2023-08-07 LAB — COMPREHENSIVE METABOLIC PANEL
ALT: 46 U/L — ABNORMAL HIGH (ref 0–44)
AST: 118 U/L — ABNORMAL HIGH (ref 15–41)
Albumin: 3.8 g/dL (ref 3.5–5.0)
Alkaline Phosphatase: 132 U/L — ABNORMAL HIGH (ref 38–126)
Anion gap: 9 (ref 5–15)
BUN: 10 mg/dL (ref 6–20)
CO2: 25 mmol/L (ref 22–32)
Calcium: 9.1 mg/dL (ref 8.9–10.3)
Chloride: 104 mmol/L (ref 98–111)
Creatinine, Ser: 0.75 mg/dL (ref 0.44–1.00)
GFR, Estimated: >60 mL/min (ref >60)
Glucose, Bld: 126 mg/dL — ABNORMAL HIGH (ref 70–99)
Potassium: 3.9 mmol/L (ref 3.5–5.1)
Sodium: 138 mmol/L (ref 135–145)
Total Bilirubin: 0.6 mg/dL (ref 0.3–1.2)
Total Protein: 7.5 g/dL (ref 6.5–8.1)

## 2023-08-07 LAB — SURGICAL PCR SCREEN
MRSA, PCR: NEGATIVE
Staphylococcus aureus: NEGATIVE

## 2023-08-07 LAB — CBC
HCT: 43.5 % (ref 36.0–46.0)
Hemoglobin: 13.9 g/dL (ref 12.0–15.0)
MCH: 30.2 pg (ref 26.0–34.0)
MCHC: 32 g/dL (ref 30.0–36.0)
MCV: 94.4 fL (ref 80.0–100.0)
Platelets: 376 10*3/uL (ref 150–400)
RBC: 4.61 MIL/uL (ref 3.87–5.11)
RDW: 12.8 % (ref 11.5–15.5)
WBC: 16.1 10*3/uL — ABNORMAL HIGH (ref 4.0–10.5)
nRBC: 0 % (ref 0.0–0.2)

## 2023-08-07 LAB — TROPONIN I (HIGH SENSITIVITY)
Troponin I (High Sensitivity): 3 ng/L (ref <18)
Troponin I (High Sensitivity): 4 ng/L (ref <18)

## 2023-08-07 LAB — LIPASE, BLOOD: Lipase: 39 U/L (ref 11–51)

## 2023-08-07 LAB — HIV ANTIBODY (ROUTINE TESTING W REFLEX): HIV Screen 4th Generation wRfx: NONREACTIVE

## 2023-08-07 SURGERY — CHOLECYSTECTOMY, ROBOT-ASSISTED, LAPAROSCOPIC
Anesthesia: General | Site: Abdomen

## 2023-08-07 MED ORDER — SUMATRIPTAN SUCCINATE 50 MG PO TABS: 50.0000 mg | ORAL_TABLET | ORAL | Status: DC | PRN

## 2023-08-07 MED ORDER — ENOXAPARIN SODIUM 40 MG/0.4ML IJ SOSY
40.0000 mg | PREFILLED_SYRINGE | INTRAMUSCULAR | Status: DC
  Administered 2023-08-07 – 2023-08-08 (×2): 40 mg via SUBCUTANEOUS
  Filled 2023-08-07 (×2): qty 0.4

## 2023-08-07 MED ORDER — MONTELUKAST SODIUM 10 MG PO TABS
10.0000 mg | ORAL_TABLET | Freq: Every day | ORAL | Status: DC
  Administered 2023-08-07: 10 mg via ORAL
  Filled 2023-08-07: qty 1

## 2023-08-07 MED ORDER — LEVOTHYROXINE SODIUM 137 MCG PO TABS
137.0000 ug | ORAL_TABLET | ORAL | Status: DC
  Administered 2023-08-08: 137 ug via ORAL
  Filled 2023-08-07: qty 1

## 2023-08-07 MED ORDER — OXYCODONE HCL 5 MG PO TABS: 5.0000 mg | ORAL_TABLET | Freq: Once | ORAL | Status: DC | PRN

## 2023-08-07 MED ORDER — ACETAMINOPHEN 10 MG/ML IV SOLN
INTRAVENOUS | Status: AC
  Filled 2023-08-07: qty 100

## 2023-08-07 MED ORDER — BUPIVACAINE-EPINEPHRINE 0.25% -1:200000 IJ SOLN
INTRAMUSCULAR | Status: DC | PRN
  Administered 2023-08-07: 30 mL

## 2023-08-07 MED ORDER — VISTASEAL 10 ML SINGLE DOSE KIT
PACK | CUTANEOUS | Status: DC | PRN
  Administered 2023-08-07: 10 mL via TOPICAL

## 2023-08-07 MED ORDER — EPHEDRINE SULFATE (PRESSORS) 50 MG/ML IJ SOLN
INTRAMUSCULAR | Status: DC | PRN
  Administered 2023-08-07 (×2): 10 mg via INTRAVENOUS
  Administered 2023-08-07: 5 mg via INTRAVENOUS

## 2023-08-07 MED ORDER — DEXAMETHASONE SODIUM PHOSPHATE 10 MG/ML IJ SOLN
INTRAMUSCULAR | Status: DC | PRN
  Administered 2023-08-07: 10 mg via INTRAVENOUS

## 2023-08-07 MED ORDER — DEXMEDETOMIDINE HCL IN NACL 80 MCG/20ML IV SOLN
INTRAVENOUS | Status: DC | PRN
  Administered 2023-08-07: 4 ug via INTRAVENOUS

## 2023-08-07 MED ORDER — MOMETASONE FURO-FORMOTEROL FUM 200-5 MCG/ACT IN AERO: 2.0000 | INHALATION_SPRAY | Freq: Two times a day (BID) | RESPIRATORY_TRACT | Status: DC | PRN

## 2023-08-07 MED ORDER — BUPROPION HCL ER (XL) 150 MG PO TB24
150.0000 mg | ORAL_TABLET | Freq: Every day | ORAL | Status: DC
  Administered 2023-08-07 – 2023-08-08 (×2): 150 mg via ORAL
  Filled 2023-08-07 (×2): qty 1

## 2023-08-07 MED ORDER — FENTANYL CITRATE (PF) 100 MCG/2ML IJ SOLN
INTRAMUSCULAR | Status: DC | PRN
  Administered 2023-08-07 (×2): 50 ug via INTRAVENOUS

## 2023-08-07 MED ORDER — PROPOFOL 10 MG/ML IV BOLUS
INTRAVENOUS | Status: DC | PRN
  Administered 2023-08-07: 180 mg via INTRAVENOUS

## 2023-08-07 MED ORDER — LEVOTHYROXINE SODIUM 50 MCG PO TABS
125.0000 ug | ORAL_TABLET | Freq: Every day | ORAL | Status: DC
  Administered 2023-08-07: 125 ug via ORAL
  Filled 2023-08-07: qty 3

## 2023-08-07 MED ORDER — OXYCODONE HCL 5 MG/5ML PO SOLN: 5.0000 mg | Freq: Once | ORAL | Status: DC | PRN

## 2023-08-07 MED ORDER — ONDANSETRON 4 MG PO TBDP: 4.0000 mg | ORAL_TABLET | Freq: Four times a day (QID) | ORAL | Status: DC | PRN

## 2023-08-07 MED ORDER — HYDROMORPHONE HCL 1 MG/ML IJ SOLN
INTRAMUSCULAR | Status: AC
  Filled 2023-08-07: qty 1

## 2023-08-07 MED ORDER — PANTOPRAZOLE SODIUM 40 MG IV SOLR
40.0000 mg | Freq: Every day | INTRAVENOUS | Status: DC
  Administered 2023-08-07: 40 mg via INTRAVENOUS
  Filled 2023-08-07: qty 10

## 2023-08-07 MED ORDER — FIBRIN SEALANT 2 ML SINGLE DOSE KIT
PACK | CUTANEOUS | Status: AC
  Filled 2023-08-07: qty 2

## 2023-08-07 MED ORDER — ALBUTEROL SULFATE HFA 108 (90 BASE) MCG/ACT IN AERS: 2.0000 | INHALATION_SPRAY | RESPIRATORY_TRACT | Status: DC | PRN

## 2023-08-07 MED ORDER — INSULIN ASPART 100 UNIT/ML IJ SOLN: 0.0000 [IU] | Freq: Every day | INTRAMUSCULAR | Status: DC

## 2023-08-07 MED ORDER — FENTANYL CITRATE (PF) 100 MCG/2ML IJ SOLN
INTRAMUSCULAR | Status: AC
  Filled 2023-08-07: qty 2

## 2023-08-07 MED ORDER — LEVOTHYROXINE SODIUM 50 MCG PO TABS: 125.0000 ug | ORAL_TABLET | ORAL | Status: DC

## 2023-08-07 MED ORDER — MORPHINE SULFATE (PF) 4 MG/ML IV SOLN
4.0000 mg | INTRAVENOUS | Status: DC | PRN
  Administered 2023-08-07 – 2023-08-08 (×6): 4 mg via INTRAVENOUS
  Filled 2023-08-07 (×6): qty 1

## 2023-08-07 MED ORDER — ACETAMINOPHEN 650 MG RE SUPP: 650.0000 mg | Freq: Four times a day (QID) | RECTAL | Status: DC | PRN

## 2023-08-07 MED ORDER — TIOTROPIUM BROMIDE MONOHYDRATE 18 MCG IN CAPS: 18.0000 ug | ORAL_CAPSULE | Freq: Every day | RESPIRATORY_TRACT | Status: DC | PRN

## 2023-08-07 MED ORDER — MIDAZOLAM HCL 2 MG/2ML IJ SOLN
INTRAMUSCULAR | Status: AC
  Filled 2023-08-07: qty 2

## 2023-08-07 MED ORDER — ONDANSETRON HCL 4 MG/2ML IJ SOLN
4.0000 mg | Freq: Four times a day (QID) | INTRAMUSCULAR | Status: DC | PRN
  Administered 2023-08-07: 4 mg via INTRAVENOUS

## 2023-08-07 MED ORDER — ROCURONIUM BROMIDE 100 MG/10ML IV SOLN
INTRAVENOUS | Status: DC | PRN
  Administered 2023-08-07: 20 mg via INTRAVENOUS

## 2023-08-07 MED ORDER — NALOXEGOL OXALATE 12.5 MG PO TABS: 12.5000 mg | ORAL_TABLET | Freq: Every day | ORAL | Status: DC

## 2023-08-07 MED ORDER — PREGABALIN 75 MG PO CAPS
150.0000 mg | ORAL_CAPSULE | Freq: Three times a day (TID) | ORAL | Status: DC
  Administered 2023-08-07 – 2023-08-08 (×4): 150 mg via ORAL
  Filled 2023-08-07 (×4): qty 2

## 2023-08-07 MED ORDER — ONDANSETRON HCL 4 MG/2ML IJ SOLN
4.0000 mg | INTRAMUSCULAR | Status: AC
  Administered 2023-08-07: 4 mg via INTRAVENOUS
  Filled 2023-08-07: qty 2

## 2023-08-07 MED ORDER — CHLORHEXIDINE GLUCONATE 0.12 % MT SOLN
OROMUCOSAL | Status: AC
  Filled 2023-08-07: qty 15

## 2023-08-07 MED ORDER — BUPIVACAINE-EPINEPHRINE (PF) 0.25% -1:200000 IJ SOLN
INTRAMUSCULAR | Status: AC
  Filled 2023-08-07: qty 30

## 2023-08-07 MED ORDER — KETOROLAC TROMETHAMINE 30 MG/ML IJ SOLN
15.0000 mg | Freq: Once | INTRAMUSCULAR | Status: AC
  Administered 2023-08-07: 15 mg via INTRAVENOUS
  Filled 2023-08-07: qty 1

## 2023-08-07 MED ORDER — FENTANYL CITRATE (PF) 100 MCG/2ML IJ SOLN: 25.0000 ug | INTRAMUSCULAR | Status: DC | PRN

## 2023-08-07 MED ORDER — DOCUSATE SODIUM 100 MG PO CAPS
100.0000 mg | ORAL_CAPSULE | Freq: Two times a day (BID) | ORAL | Status: DC
  Administered 2023-08-07 – 2023-08-08 (×3): 100 mg via ORAL
  Filled 2023-08-07 (×3): qty 1

## 2023-08-07 MED ORDER — OXYCODONE HCL 5 MG PO TABS
10.0000 mg | ORAL_TABLET | Freq: Four times a day (QID) | ORAL | Status: DC | PRN
  Administered 2023-08-07 – 2023-08-08 (×3): 10 mg via ORAL
  Filled 2023-08-07 (×3): qty 2

## 2023-08-07 MED ORDER — SODIUM CHLORIDE 0.9 % IV SOLN: INTRAVENOUS | Status: DC | PRN

## 2023-08-07 MED ORDER — PHENYLEPHRINE HCL (PRESSORS) 10 MG/ML IV SOLN
INTRAVENOUS | Status: DC | PRN
  Administered 2023-08-07: 160 ug via INTRAVENOUS

## 2023-08-07 MED ORDER — INDOCYANINE GREEN 25 MG IV SOLR
INTRAVENOUS | Status: AC
  Filled 2023-08-07: qty 10

## 2023-08-07 MED ORDER — MORPHINE SULFATE (PF) 4 MG/ML IV SOLN
4.0000 mg | Freq: Once | INTRAVENOUS | Status: AC
  Administered 2023-08-07: 4 mg via INTRAVENOUS
  Filled 2023-08-07: qty 1

## 2023-08-07 MED ORDER — MIDAZOLAM HCL 2 MG/2ML IJ SOLN
INTRAMUSCULAR | Status: DC | PRN
  Administered 2023-08-07: 2 mg via INTRAVENOUS

## 2023-08-07 MED ORDER — PIPERACILLIN-TAZOBACTAM 3.375 G IVPB
3.3750 g | Freq: Three times a day (TID) | INTRAVENOUS | Status: DC
  Administered 2023-08-07 – 2023-08-08 (×4): 3.375 g via INTRAVENOUS
  Filled 2023-08-07 (×4): qty 50

## 2023-08-07 MED ORDER — HYDROCODONE-ACETAMINOPHEN 5-325 MG PO TABS: 1.0000 | ORAL_TABLET | ORAL | Status: DC | PRN

## 2023-08-07 MED ORDER — INDOCYANINE GREEN 25 MG IV SOLR
1.2500 mg | Freq: Once | INTRAVENOUS | Status: AC
  Administered 2023-08-07: 1.25 mg via INTRAVENOUS

## 2023-08-07 MED ORDER — LORATADINE 10 MG PO TABS
10.0000 mg | ORAL_TABLET | Freq: Every day | ORAL | Status: DC
  Administered 2023-08-07 – 2023-08-08 (×2): 10 mg via ORAL
  Filled 2023-08-07 (×2): qty 1

## 2023-08-07 MED ORDER — ACETAMINOPHEN 10 MG/ML IV SOLN
INTRAVENOUS | Status: DC | PRN
  Administered 2023-08-07: 1000 mg via INTRAVENOUS

## 2023-08-07 MED ORDER — HYDROMORPHONE HCL 1 MG/ML IJ SOLN
INTRAMUSCULAR | Status: DC | PRN
Start: 2023-08-07 — End: 2023-08-07
  Administered 2023-08-07: 1 mg via INTRAVENOUS

## 2023-08-07 MED ORDER — ALBUTEROL SULFATE (2.5 MG/3ML) 0.083% IN NEBU
2.5000 mg | INHALATION_SOLUTION | RESPIRATORY_TRACT | Status: DC | PRN
  Filled 2023-08-07: qty 3

## 2023-08-07 MED ORDER — LACTATED RINGERS IV BOLUS
1000.0000 mL | Freq: Once | INTRAVENOUS | Status: AC
  Administered 2023-08-07: 1000 mL via INTRAVENOUS

## 2023-08-07 MED ORDER — TIZANIDINE HCL 4 MG PO TABS: 4.0000 mg | ORAL_TABLET | Freq: Three times a day (TID) | ORAL | Status: DC | PRN

## 2023-08-07 MED ORDER — SODIUM CHLORIDE 0.9 % IV SOLN: INTRAVENOUS | Status: DC

## 2023-08-07 MED ORDER — NORTRIPTYLINE HCL 10 MG PO CAPS
10.0000 mg | ORAL_CAPSULE | Freq: Two times a day (BID) | ORAL | Status: DC
  Administered 2023-08-07 – 2023-08-08 (×3): 10 mg via ORAL
  Filled 2023-08-07 (×3): qty 1

## 2023-08-07 MED ORDER — GADOBUTROL 1 MMOL/ML IV SOLN
8.0000 mL | Freq: Once | INTRAVENOUS | Status: AC | PRN
  Administered 2023-08-07: 8 mL via INTRAVENOUS

## 2023-08-07 MED ORDER — ACETAMINOPHEN 325 MG PO TABS: 650.0000 mg | ORAL_TABLET | Freq: Four times a day (QID) | ORAL | Status: DC | PRN

## 2023-08-07 MED ORDER — SUCCINYLCHOLINE CHLORIDE 200 MG/10ML IV SOSY
PREFILLED_SYRINGE | INTRAVENOUS | Status: DC | PRN
  Administered 2023-08-07: 100 mg via INTRAVENOUS

## 2023-08-07 MED ORDER — PNEUMOCOCCAL 20-VAL CONJ VACC 0.5 ML IM SUSY: 0.5000 mL | PREFILLED_SYRINGE | INTRAMUSCULAR | Status: DC

## 2023-08-07 MED ORDER — CHLORHEXIDINE GLUCONATE 0.12 % MT SOLN
15.0000 mL | Freq: Once | OROMUCOSAL | Status: AC
  Administered 2023-08-07: 15 mL via OROMUCOSAL

## 2023-08-07 MED ORDER — POLYETHYLENE GLYCOL 3350 17 G PO PACK: 17.0000 g | PACK | Freq: Every day | ORAL | Status: DC | PRN

## 2023-08-07 MED ORDER — INSULIN ASPART 100 UNIT/ML IJ SOLN: 0.0000 [IU] | Freq: Three times a day (TID) | INTRAMUSCULAR | Status: DC

## 2023-08-07 MED ORDER — LIDOCAINE HCL (CARDIAC) PF 100 MG/5ML IV SOSY
PREFILLED_SYRINGE | INTRAVENOUS | Status: DC | PRN
  Administered 2023-08-07: 100 mg via INTRAVENOUS

## 2023-08-07 MED ORDER — SUGAMMADEX SODIUM 200 MG/2ML IV SOLN
INTRAVENOUS | Status: DC | PRN
  Administered 2023-08-07: 200 mg via INTRAVENOUS

## 2023-08-07 SURGICAL SUPPLY — 59 items
ADH SKN CLS APL DERMABOND .7 (GAUZE/BANDAGES/DRESSINGS) ×1
APL LAPSCP 45 DL APL FL B (MISCELLANEOUS) ×1
APPLICATOR VISTASEAL FLEXIBLE (MISCELLANEOUS) IMPLANT
APPLIER CLIP 13 LRG OPEN (CLIP) ×1
APPLIER CLIP 32.77 55 M/L DVNC (INSTRUMENTS) ×1
APR CLIP 33D 8 MED LRG 32.77 (INSTRUMENTS) ×1
APR CLP LRG 13 20 CLIP (CLIP) ×1
BAG PRESSURE INF REUSE 1000 (BAG) IMPLANT
CANNULA REDUCER 12-8 DVNC XI (CANNULA) ×1 IMPLANT
CATH REDDICK CHOLANGI 4FR 50CM (CATHETERS) IMPLANT
CAUTERY HOOK MNPLR 1.6 DVNC XI (INSTRUMENTS) ×1 IMPLANT
CLIP APPLIE 13 LRG OPEN (CLIP) IMPLANT
CLIP APPLIE 32.77 55 M/L DVNC (INSTRUMENTS) IMPLANT
CLIP LIGATING HEM O LOK PURPLE (MISCELLANEOUS) IMPLANT
CLIP LIGATING HEMO O LOK GREEN (MISCELLANEOUS) ×1 IMPLANT
DERMABOND ADVANCED .7 DNX12 (GAUZE/BANDAGES/DRESSINGS) ×1 IMPLANT
DRAPE ARM DVNC X/XI (DISPOSABLE) ×4 IMPLANT
DRAPE C-ARM XRAY 36X54 (DRAPES) IMPLANT
DRAPE COLUMN DVNC XI (DISPOSABLE) ×1 IMPLANT
ELECT REM PT RETURN 9FT ADLT (ELECTROSURGICAL) ×1
ELECTRODE REM PT RTRN 9FT ADLT (ELECTROSURGICAL) ×1 IMPLANT
FORCEPS BPLR 8 MD DVNC XI (FORCEP) ×1 IMPLANT
FORCEPS BPLR R/ABLATION 8 DVNC (INSTRUMENTS) ×1 IMPLANT
FORCEPS PROGRASP DVNC XI (FORCEP) ×1 IMPLANT
GLOVE BIO SURGEON STRL SZ 6.5 (GLOVE) ×2 IMPLANT
GLOVE BIOGEL PI IND STRL 6.5 (GLOVE) ×2 IMPLANT
GOWN STRL REUS W/ TWL LRG LVL3 (GOWN DISPOSABLE) ×3 IMPLANT
GOWN STRL REUS W/TWL LRG LVL3 (GOWN DISPOSABLE) ×3
GRASPER SUT TROCAR 14GX15 (MISCELLANEOUS) ×1 IMPLANT
IRRIGATOR SUCT 8 DISP DVNC XI (IRRIGATION / IRRIGATOR) IMPLANT
IV CATH ANGIO 12GX3 LT BLUE (NEEDLE) IMPLANT
IV NS 1000ML (IV SOLUTION)
IV NS 1000ML BAXH (IV SOLUTION) IMPLANT
KIT PINK PAD W/HEAD ARE REST (MISCELLANEOUS) ×1 IMPLANT
KIT PINK PAD W/HEAD ARM REST (MISCELLANEOUS) ×1 IMPLANT
LABEL OR SOLS (LABEL) ×1 IMPLANT
MANIFOLD NEPTUNE II (INSTRUMENTS) ×1 IMPLANT
NDL HYPO 22X1.5 SAFETY MO (MISCELLANEOUS) ×1 IMPLANT
NDL INSUFFLATION 14GA 120MM (NEEDLE) ×1 IMPLANT
NEEDLE HYPO 22X1.5 SAFETY MO (MISCELLANEOUS) ×1 IMPLANT
NEEDLE INSUFFLATION 14GA 120MM (NEEDLE) ×1 IMPLANT
NS IRRIG 500ML POUR BTL (IV SOLUTION) ×1 IMPLANT
OBTURATOR OPTICAL STND 8 DVNC (TROCAR) ×1
OBTURATOR OPTICALSTD 8 DVNC (TROCAR) ×1 IMPLANT
PACK LAP CHOLECYSTECTOMY (MISCELLANEOUS) ×1 IMPLANT
SEAL UNIV 5-12 XI (MISCELLANEOUS) ×4 IMPLANT
SET TUBE SMOKE EVAC HIGH FLOW (TUBING) ×1 IMPLANT
SOL ELECTROSURG ANTI STICK (MISCELLANEOUS) ×1
SOLUTION ELECTROSURG ANTI STCK (MISCELLANEOUS) ×1 IMPLANT
SPIKE FLUID TRANSFER (MISCELLANEOUS) ×2 IMPLANT
SPONGE T-LAP 4X18 ~~LOC~~+RFID (SPONGE) IMPLANT
SUT MNCRL 4-0 (SUTURE) ×1
SUT MNCRL 4-0 27XMFL (SUTURE) ×1
SUT VICRYL 0 UR6 27IN ABS (SUTURE) ×1 IMPLANT
SUTURE MNCRL 4-0 27XMF (SUTURE) ×1 IMPLANT
SYS BAG RETRIEVAL 10MM (BASKET) ×1
SYSTEM BAG RETRIEVAL 10MM (BASKET) ×1 IMPLANT
TRAP FLUID SMOKE EVACUATOR (MISCELLANEOUS) ×1 IMPLANT
WATER STERILE IRR 500ML POUR (IV SOLUTION) ×1 IMPLANT

## 2023-08-07 NOTE — H&P (Signed)
SURGICAL CONSULTATION NOTE   HISTORY OF PRESENT ILLNESS (HPI):  57 y.o. female presented to [facility] ED for evaluation of abdominal pain. Patient reports started having abdominal pain for the last 2 nights.  Patient endorses that the pain is localized to the right upper quadrant.  The pain radiates to the right back and right shoulder.  Patient cannot identify any alleviating or aggravating factors.  In the ED she was found with tenderness palpation in the right upper quadrant.  Labs shows leukocytosis.  There was some liver enzymes elevated.  She had MRCP negative for choledocholithiasis.  She had an ultrasound showing cholecystitis.  I personally evaluated the images.  Surgery is consulted by Dr. York Cerise in this context for evaluation and management of acute cholecystitis.  PAST MEDICAL HISTORY (PMH):  Past Medical History:  Diagnosis Date   Abnormal uterine bleeding    Anemia    Anxiety    Arthritis    NECK   Bell's palsy    right side   Bipolar 1 disorder (HCC)    Brittle bones    Complication of anesthesia    had to come back to ER after shoulder scope block breathing diffuculty   COPD (chronic obstructive pulmonary disease) (HCC)    Depression    Diabetes mellitus without complication (HCC)    GERD (gastroesophageal reflux disease)    Heart murmur    Hepatitis 2016   c: treated by Dr. Servando Snare Hassell Halim)   Hypercholesteremia    Hypothyroidism    Meningitis 01/20/2017   Migraine headache    after meningitis.  none since starting nortriptyline   Neck pain    Neck pain, chronic    Panic attack    Sciatica    Shortness of breath dyspnea    Thyroid disease    Wears dentures    full upper     PAST SURGICAL HISTORY (PSH):  Past Surgical History:  Procedure Laterality Date   ABDOMINAL HYSTERECTOMY     CARPAL TUNNEL RELEASE Right 11/22/2017   Procedure: CARPAL TUNNEL RELEASE;  Surgeon: Erin Sons, MD;  Location: [facility];  Service: Orthopedics;  Laterality: Right;    COLONOSCOPY WITH PROPOFOL N/A 03/27/2018   Procedure: COLONOSCOPY WITH PROPOFOL;  Surgeon: Toledo, Boykin Nearing, MD;  Location: [facility];  Service: Gastroenterology;  Laterality: N/A;   ESOPHAGOGASTRODUODENOSCOPY (EGD) WITH PROPOFOL N/A 03/27/2018   Procedure: ESOPHAGOGASTRODUODENOSCOPY (EGD) WITH PROPOFOL;  Surgeon: Toledo, Boykin Nearing, MD;  Location: [facility];  Service: Gastroenterology;  Laterality: N/A;   HYSTEROSCOPY WITH D & C N/A 02/08/2016   Procedure: DILATATION AND CURETTAGE /HYSTEROSCOPY;  Surgeon: Herold Harms, MD;  Location: [facility];  Service: Gynecology;  Laterality: N/A;   KNEE ARTHROSCOPY WITH MEDIAL MENISECTOMY Right 11/27/2020   Procedure: Right arthroscopic partial medial meniscectomy, chondroplasty, and medial tibial subchondroplasty;  Surgeon: Signa Kell, MD;  Location: [facility];  Service: Orthopedics;  Laterality: Right;   NECK SURGERY  2009   failed fusion   SALPINGOOPHORECTOMY Bilateral 04/11/2016   Procedure: SALPINGO OOPHORECTOMY;  Surgeon: Herold Harms, MD;  Location: [facility];  Service: Gynecology;  Laterality: Bilateral;   SHOULDER ARTHROSCOPY Right 01/04/2019   Procedure: ARTHROSCOPY SHOULDER WITH ARTHROSCOPIC  DISTAL CLAVICLE EXCISION, SUBACROMIAL AND DECOMPRESSION BICEPS TENOTOMY;  Surgeon: Signa Kell, MD;  Location: [facility];  Service: Orthopedics;  Laterality: Right;  TODD MUNDY ASSISTING ARTHROCARE WAND FLOW 90 WAND BARREL [place] CHAIR WITH SPYDER SLING AFTER SURGERY Diabetic - injectible   spinal meningitis  12/2016   spleenectomy  TOTAL KNEE ARTHROPLASTY Left 03/09/2021   Procedure: TOTAL KNEE ARTHROPLASTY - [place] Neighbor to Assist;  Surgeon: Kennedy Bucker, MD;  Location: [facility];  Service: Orthopedics;  Laterality: Left;   TOTAL KNEE ARTHROPLASTY Right 12/16/2021   Procedure: TOTAL KNEE ARTHROPLASTY;  Surgeon: Kennedy Bucker, MD;  Location: [facility];  Service: Orthopedics;  Laterality: Right;   TUBAL LIGATION      VAGINAL HYSTERECTOMY Bilateral 04/11/2016   Procedure: HYSTERECTOMY VAGINAL;  Surgeon: Herold Harms, MD;  Location: [facility];  Service: Gynecology;  Laterality: Bilateral;     MEDICATIONS:  Prior to Admission medications   Medication Sig Start Date End Date Taking? Authorizing Provider  albuterol (PROVENTIL) (2.5 MG/3ML) 0.083% nebulizer solution Take 2.5 mg by nebulization every 6 (six) hours as needed for wheezing or shortness of breath.    [provider]  albuterol (VENTOLIN HFA) 108 (90 Base) MCG/ACT inhaler Inhale 2 puffs into the lungs every 4 (four) hours as needed for wheezing or shortness of breath.  07/15/19   [provider]  buPROPion (WELLBUTRIN XL) 150 MG 24 hr tablet Take 150 mg daily by mouth.    [provider]  cetirizine (ZYRTEC) 10 MG tablet Take 10 mg by mouth daily.     [provider]  docusate sodium (COLACE) 100 MG capsule Take 100 mg by mouth 2 (two) times daily.    [provider]  enoxaparin (LOVENOX) 40 MG/0.4ML injection Inject 0.4 mLs (40 mg total) into the skin daily for 14 days. 12/17/21 12/31/21  Evon Slack, PA-C  enoxaparin (LOVENOX) 40 MG/0.4ML injection Inject 0.4 mLs (40 mg total) into the skin daily for 14 days. 12/17/21 12/31/21  Lasandra Beech B, PA-C  ergocalciferol (VITAMIN D2) 1.25 MG (50000 UT) capsule Take 50,000 Units by mouth every Tuesday.    [provider]  estradiol (ESTRACE) 1 MG tablet Take 1 tablet (1 mg total) by mouth daily. 05/25/16   Defrancesco, Prentice Docker, MD  fluticasone (FLONASE) 50 MCG/ACT nasal spray Place 2 sprays into both nostrils daily as needed for allergies or rhinitis.    [provider]  Fluticasone-Salmeterol (ADVAIR) 250-50 MCG/DOSE AEPB Inhale 2 puffs 2 (two) times daily into the lungs.     [provider]  levothyroxine (SYNTHROID) 200 MCG tablet Take 200 mcg by mouth at bedtime. 07/29/19   [provider]  liraglutide (VICTOZA) 18  MG/3ML SOPN Inject 1.8 mg into the skin every evening.  08/22/19   [provider]  metFORMIN (GLUCOPHAGE) 500 MG tablet Take 500 mg by mouth 2 (two) times daily with a meal.    [provider]  montelukast (SINGULAIR) 10 MG tablet Take 10 mg by mouth daily.    [provider]  Multiple Vitamin (MULTIVITAMIN WITH MINERALS) TABS tablet Take 1 tablet by mouth daily.    [provider]  naloxegol oxalate (MOVANTIK) 12.5 MG TABS tablet Take 12.5 mg by mouth daily.    [provider]  nortriptyline (PAMELOR) 10 MG capsule Take 10 mg by mouth at bedtime.    [provider]  ondansetron (ZOFRAN ODT) 4 MG disintegrating tablet Take 1 tablet (4 mg total) by mouth every 8 (eight) hours as needed for nausea or vomiting. Patient not taking: Reported on 11/10/2021 11/27/20   Signa Kell, MD  ondansetron (ZOFRAN) 4 MG tablet Take 1 tablet (4 mg total) by mouth daily as needed for nausea or vomiting. Patient not taking: Reported on 11/10/2021 03/18/21   Minna Antis, MD  Oxycodone HCl 20 MG TABS Take 1 tablet (20 mg total) by mouth every 3 (three) hours as needed (pain score 7-10). 12/17/21   Madelyn Flavors, PA-C  OXYGEN Inhale 2 L into the lungs at bedtime.    [provider]  polyethylene glycol (MIRALAX / GLYCOLAX) 17 g packet Take 17 g by mouth daily as needed for mild constipation. 12/17/21   Madelyn Flavors, PA-C  pregabalin (LYRICA) 150 MG capsule Take 150 mg by mouth in the morning, at noon, and at bedtime.     [provider]  rizatriptan (MAXALT) 5 MG tablet Take 5 mg by mouth as needed for migraine. May repeat in 2 hours if needed    [provider]  simvastatin (ZOCOR) 40 MG tablet Take 40 mg by mouth at bedtime.    [provider]  tiotropium (SPIRIVA HANDIHALER) 18 MCG inhalation capsule Place 18 mcg into inhaler and inhale daily.  07/15/19   [provider]  tiZANidine (ZANAFLEX) 4 MG tablet  Take 4 mg by mouth 3 (three) times daily as needed for muscle spasms.    [provider]     ALLERGIES:  Allergies  Allergen Reactions   Vancomycin Rash     SOCIAL HISTORY:  Social History   Socioeconomic History   Marital status: Single    Spouse name: Not on file   Number of children: Not on file   Years of education: Not on file   Highest education level: Not on file  Occupational History   Not on file  Tobacco Use   Smoking status: Former    Current packs/day: 0.00    Average packs/day: 1 pack/day for 40.9 years (40.9 ttl pk-yrs)    Types: Cigarettes    Start date: 02/08/1974    Quit date: 2016    Years since quitting: 8.6   Smokeless tobacco: Never  Vaping Use   Vaping status: Never Used  Substance and Sexual Activity   Alcohol use: Yes    Comment: occas (1-2x/yr)   Drug use: No    Comment: CPD oil   Sexual activity: Not Currently    Birth control/protection: Surgical  Other Topics Concern   Not on file  Social History Narrative   Quit smoking in 2019; no alcohol; disabled; previous truck driver; 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    Social Determinants of Health   Financial Resource Strain: High Risk (03/31/2022)   Received from [facility], [facility]   Overall Financial Resource Strain (CARDIA)    Difficulty of Paying Living Expenses: Hard  Food Insecurity: No Food Insecurity (08/07/2023)   Hunger Vital Sign    Worried About Running Out of Food in the Last Year: Never true    Ran Out of Food in the Last Year: Never true  Transportation Needs: No Transportation Needs (08/07/2023)   PRAPARE - Administrator, Civil Service (Medical): No    Lack of Transportation (Non-Medical): No  Physical Activity: Inactive (03/21/2022)   Received from [facility], [facility]   Exercise Vital Sign    Days of Exercise per Week: 0 days    Minutes of Exercise per Session: 0 min  Stress:  Stress Concern Present (03/31/2022)   Received from [facility], [facility]   [employer] of Occupational Health - Occupational Stress Questionnaire    Feeling of Stress : To some extent  Social Connections: Moderately Isolated (03/31/2022)  Received from [facility], [facility]   Social Connection and Isolation Panel [NHANES]    Frequency of Communication with Friends and Family: Three times a week    Frequency of Social Gatherings with Friends and Family: Twice a week    Attends Religious Services: More than 4 times per year    Active Member of Clubs or Organizations: No    Attends Banker Meetings: Never    Marital Status: Separated  Intimate Partner Violence: Not At Risk (08/07/2023)   Humiliation, Afraid, Rape, and Kick questionnaire    Fear of Current or Ex-Partner: No    Emotionally Abused: No    Physically Abused: No    Sexually Abused: No     FAMILY HISTORY:  Family History  Problem Relation Age of Onset   Diabetes Mother    Heart disease Mother    Cervical cancer Sister    Diabetes Maternal Grandmother    Colon cancer Maternal Grandfather    Breast cancer Neg Hx    Ovarian cancer Neg Hx      REVIEW OF SYSTEMS:  Constitutional: denies weight loss, fever, chills, or sweats  Eyes: denies any other vision changes, history of eye injury  ENT: denies sore throat, hearing problems  Respiratory: denies shortness of breath, wheezing  Cardiovascular: denies chest pain, palpitations  Gastrointestinal: positive abdominal pain, nausea and vomitnig Genitourinary: denies burning with urination or urinary frequency Musculoskeletal: denies any other joint pains or cramps  Skin: denies any other rashes or skin discolorations  Neurological: denies any other headache, dizziness, weakness  Psychiatric: denies any other depression, anxiety   All other review of systems were negative   VITAL  SIGNS:  Temp:  [97.6 F (36.4 C)-98 F (36.7 C)] 98 F (36.7 C) (08/12 0638) Pulse Rate:  [68-83] 78 (08/12 0638) Resp:  [12-20] 20 (08/12 8119) BP: (123-146)/(63-92) 146/86 (08/12 1478) SpO2:  [99 %-100 %] 100 % (08/12 2956) Weight:  [83.5 kg-85 kg] 85 kg (08/12 0638)     Height: 5\' 8"  (172.7 cm) Weight: 85 kg BMI (Calculated): 28.5   INTAKE/OUTPUT:  This shift: No intake/output data recorded.  Last 2 shifts: @IOLAST2SHIFTS @   PHYSICAL EXAM:  Constitutional:  -- Normal body habitus  -- Awake, alert, and oriented x3  Eyes:  -- Pupils equally round and reactive to light  -- No scleral icterus  Ear, nose, and throat:  -- No jugular venous distension  Pulmonary:  -- No crackles  -- Equal breath sounds bilaterally -- Breathing non-labored at rest Cardiovascular:  -- S1, S2 present  -- No pericardial rubs Gastrointestinal:  -- Abdomen soft, tender in the right upper quadrant, non-distended, no guarding or rebound tenderness -- No abdominal masses appreciated, pulsatile or otherwise  Musculoskeletal and Integumentary:  -- Wounds: None appreciated -- Extremities: B/L UE and LE FROM, hands and feet warm, no edema  Neurologic:  -- Motor function: intact and symmetric -- Sensation: intact and symmetric   Labs:     Latest Ref Rng & Units 08/07/2023   12:42 AM 02/18/2022    2:32 PM 12/17/2021    4:18 AM  CBC  WBC 4.0 - 10.5 K/uL 16.1  11.0  17.0   Hemoglobin 12.0 - 15.0 g/dL 21.3  08.6  57.8   Hematocrit 36.0 - 46.0 % 43.5  40.9  39.1   Platelets 150 - 400 K/uL 376  367  388       Latest Ref Rng & Units 08/07/2023  12:42 AM 02/18/2022    2:32 PM 12/17/2021    4:18 AM  CMP  Glucose 70 - 99 mg/dL 630  160  109   BUN 6 - 20 mg/dL 10  15  7    Creatinine 0.44 - 1.00 mg/dL 3.23  5.57  3.22   Sodium 135 - 145 mmol/L 138  136  133   Potassium 3.5 - 5.1 mmol/L 3.9  4.4  4.5   Chloride 98 - 111 mmol/L 104  100  101   CO2 22 - 32 mmol/L 25  27  26    Calcium 8.9 - 10.3 mg/dL  9.1  9.3  8.3   Total Protein 6.5 - 8.1 g/dL 7.5  6.9    Total Bilirubin 0.3 - 1.2 mg/dL 0.6  0.6    Alkaline Phos 38 - 126 U/L 132  97    AST 15 - 41 U/L 118  18    ALT 0 - 44 U/L 46  15      Imaging studies:  EXAM: MRI ABDOMEN WITHOUT AND WITH CONTRAST (INCLUDING MRCP)   TECHNIQUE: Multiplanar multisequence MR imaging of the abdomen was performed both before and after the administration of intravenous contrast. Heavily T2-weighted images of the biliary and pancreatic ducts were obtained, and three-dimensional MRCP images were rendered by post processing.   CONTRAST:  8mL GADAVIST GADOBUTROL 1 MMOL/ML IV SOLN   COMPARISON:  Ultrasound abdomen earlier same day. CT stone study 07/06/2021   FINDINGS: Lower chest: Unremarkable.   Hepatobiliary: No suspicious focal abnormality within the liver parenchyma. Numerous tiny 2-3 mm gallstones evident. Tiny stones are seen in the neck of the gallbladder (coronal MRCP image 7 of series 13 and potentially just into the cystic duct (image 6 of series 13). No gallbladder wall thickening or pericholecystic fluid. Common bile duct diameter upper normal at 6 mm. No filling defect in the common bile duct to suggest choledocholithiasis.   Pancreas: No focal mass lesion. No dilatation of the main duct. No intraparenchymal cyst. No peripancreatic edema.   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   Adrenals/Urinary Tract: No adrenal nodule or mass. Kidneys unremarkable.   Stomach/Bowel: Stomach is unremarkable. No gastric wall thickening. No evidence of outlet obstruction. Duodenum is normally positioned as is the ligament of Treitz. No small bowel or colonic dilatation within the visualized abdomen.   Vascular/Lymphatic: No abdominal aortic aneurysm. No abdominal lymphadenopathy.   Other:  No intraperitoneal free fluid.   Musculoskeletal: No focal suspicious marrow enhancement within  the visualized bony anatomy.   IMPRESSION: 1. Cholelithiasis with tiny stones in the neck of the gallbladder and potentially a tiny 2-3 mm stone in the cystic duct. No gallbladder wall thickening or pericholecystic fluid. 2. Common bile duct diameter upper normal at 6 mm. No evidence for choledocholithiasis. 3. 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     Electronically Signed   By: Kennith Center M.D.   On: 08/07/2023 05:00  Assessment/Plan:  57 y.o. female with acute cholecystitis, complicated by pertinent comorbidities including chronic pain syndrome, diabetes.  Patient with history, physical exam and images consistent with acute cholecystitis. Patient oriented about diagnosis and surgical management as treatment.   Discussed the risk of surgery including post-op infxn, seroma, biloma, chronic pain, poor-delayed wound healing, retained gallstone, conversion to open procedure, post-op SBO or ileus, and need for additional procedures to address said risks.  The risks of general anesthetic including  MI, CVA, sudden death or even reaction to anesthetic medications also discussed. Alternatives include continued observation.  Benefits include possible symptom relief, prevention of complications including acute cholecystitis, pancreatitis.  Gae Gallop, MD

## 2023-08-07 NOTE — Plan of Care (Signed)

## 2023-08-07 NOTE — Op Note (Signed)
Preoperative diagnosis: Acute cholecystitis  Postoperative diagnosis: Same  Procedure: Robotic Assisted Laparoscopic Cholecystectomy.   Anesthesia: GETA   Surgeon: Dr. Hazle Quant  Wound Classification: Clean Contaminated  Indications: Patient is a 57 y.o. female developed right upper quadrant pain, nausea, leukocytosis and on workup was found to have cholelithiasis cholecystitis. MRCP negative for choledocholithiasis. Robotic Assisted Laparoscopic cholecystectomy was elected.  Findings: Pericholecystic edema Dilated cystic duct Critical view of safety achieved Cystic duct and artery identified, ligated and divided Adequate hemostasis  Description of procedure: The patient was placed on the operating table in the supine position. General anesthesia was induced. A time-out was completed verifying correct patient, procedure, site, positioning, and implant(s) and/or special equipment prior to beginning this procedure. An orogastric tube was placed. The abdomen was prepped and draped in the usual sterile fashion.  An incision was made in a natural skin line below the umbilicus.  The fascia was elevated and the Veress needle inserted. Proper position was confirmed by aspiration and saline meniscus test.  The abdomen was insufflated with carbon dioxide to a pressure of 15 mmHg. The patient tolerated insufflation well. A 8-mm trocar was then inserted in optiview fashion.  The laparoscope was inserted and the abdomen inspected. No injuries from initial trocar placement were noted. Additional trocars were then inserted in the following locations: an 8-mm trocar in the left lateral abdomen, and another two 8-mm trocars to the right side of the abdomen 5 cm appart. The umbilical trocar was changed to a 12 mm trocar all under direct visualization. The abdomen was inspected and no abnormalities were found. The table was placed in the reverse Trendelenburg position with the right side up. The robotic  arms were docked and target anatomy identified. Instrument inserted under direct visualization.  Filmy adhesions between the gallbladder and omentum, duodenum and transverse colon were lysed with electrocautery. The dome of the gallbladder was grasped with a prograsp and retracted over the dome of the liver. The infundibulum was also grasped with an atraumatic grasper and retracted toward the right lower quadrant. This maneuver exposed Calot's triangle. The peritoneum overlying the gallbladder infundibulum was then incised and the cystic duct and cystic artery identified and circumferentially dissected. Critical view of safety reviewed before ligating any structure. Firefly images taken to visualize biliary ducts. The cystic duct and cystic artery were then doubly clipped and divided close to the gallbladder.  The gallbladder was then dissected from its peritoneal attachments by electrocautery. Hemostasis was checked and the gallbladder and contained stones were removed using an endoscopic retrieval bag. The gallbladder was passed off the table as a specimen. There was no evidence of bleeding from the gallbladder fossa or cystic artery or leakage of the bile from the cystic duct stump. Secondary trocars were removed under direct vision. No bleeding was noted. The robotic arms were undoked. The scope was withdrawn and the umbilical trocar removed. The abdomen was allowed to collapse. The fascia of the 12mm trocar sites was closed with figure-of-eight 0 vicryl sutures. The skin was closed with subcuticular sutures of 4-0 monocryl and topical skin adhesive. The orogastric tube was removed.  The patient tolerated the procedure well and was taken to the postanesthesia care unit in stable condition.   Specimen: Gallbladder  Complications: None  EBL: 10 mL

## 2023-08-07 NOTE — Anesthesia Preprocedure Evaluation (Signed)
Anesthesia Evaluation  Patient identified by MRN, date of birth, ID band Patient awake    Reviewed: Allergy & Precautions, NPO status , Patient's Chart, lab work & pertinent test results  History of Anesthesia Complications Negative for: history of anesthetic complications  Airway Mallampati: II  TM Distance: >3 FB Neck ROM: full    Dental  (+) Missing   Pulmonary shortness of breath and with exertion, COPD, former smoker   Pulmonary exam normal        Cardiovascular (-) angina (-) Past MI negative cardio ROS Normal cardiovascular exam     Neuro/Psych  Headaches PSYCHIATRIC DISORDERS       Neuromuscular disease    GI/Hepatic ,GERD  ,,(+) Hepatitis -  Endo/Other  diabetes, Type 2    Renal/GU      Musculoskeletal   Abdominal   Peds  Hematology negative hematology ROS (+)   Anesthesia Other Findings Past Medical History: No date: Abnormal uterine bleeding No date: Anemia No date: Anxiety No date: Arthritis     Comment:  NECK No date: Bell's palsy     Comment:  right side No date: Bipolar 1 disorder (HCC) No date: Brittle bones No date: Complication of anesthesia     Comment:  had to come back to ER after shoulder scope block               breathing diffuculty No date: COPD (chronic obstructive pulmonary disease) (HCC) No date: Depression No date: Diabetes mellitus without complication (HCC) No date: GERD (gastroesophageal reflux disease) No date: Heart murmur 2016: Hepatitis     Comment:  c: treated by Dr. Servando Snare ([email or web address]) No date: Hypercholesteremia No date: Hypothyroidism 01/20/2017: Meningitis No date: Migraine headache     Comment:  after meningitis.  none since starting nortriptyline No date: Neck pain No date: Neck pain, chronic No date: Panic attack No date: Sciatica No date: Shortness of breath dyspnea No date: Thyroid disease No date: Wears dentures     Comment:  full upper  Past Surgical  History: No date: ABDOMINAL HYSTERECTOMY 11/22/2017: CARPAL TUNNEL RELEASE; Right     Comment:  Procedure: CARPAL TUNNEL RELEASE;  Surgeon: Erin Sons, MD;  Location: [facility];  Service: Orthopedics;                Laterality: Right; 03/27/2018: COLONOSCOPY WITH PROPOFOL; N/A     Comment:  Procedure: COLONOSCOPY WITH PROPOFOL;  Surgeon: Toledo,               Boykin Nearing, MD;  Location: [facility];  Service:               Gastroenterology;  Laterality: N/A; 03/27/2018: ESOPHAGOGASTRODUODENOSCOPY (EGD) WITH PROPOFOL; N/A     Comment:  Procedure: ESOPHAGOGASTRODUODENOSCOPY (EGD) WITH               PROPOFOL;  Surgeon: Toledo, Boykin Nearing, MD;  Location:               [facility];  Service: Gastroenterology;  Laterality:               N/A; 02/08/2016: HYSTEROSCOPY WITH D & C; N/A     Comment:  Procedure: DILATATION AND CURETTAGE /HYSTEROSCOPY;                Surgeon: Herold Harms, MD;  Location: [facility];  Service: Gynecology;  Laterality: N/A; 11/27/2020: KNEE ARTHROSCOPY WITH MEDIAL MENISECTOMY; Right     Comment:  Procedure: Right arthroscopic partial medial               meniscectomy, chondroplasty, and medial tibial               subchondroplasty;  Surgeon: Signa Kell, MD;  Location:               [facility];  Service: Orthopedics;  Laterality: Right; 2009: NECK SURGERY     Comment:  failed fusion 04/11/2016: SALPINGOOPHORECTOMY; Bilateral     Comment:  Procedure: SALPINGO OOPHORECTOMY;  Surgeon: Herold Harms, MD;  Location: [facility];  Service:               Gynecology;  Laterality: Bilateral; 01/04/2019: SHOULDER ARTHROSCOPY; Right     Comment:  Procedure: ARTHROSCOPY SHOULDER WITH ARTHROSCOPIC                DISTAL CLAVICLE EXCISION, SUBACROMIAL AND DECOMPRESSION               BICEPS TENOTOMY;  Surgeon: Signa Kell, MD;  Location:               [facility];  Service: Orthopedics;  Laterality:               Right;   TODD MUNDY ASSISTING ARTHROCARE WAND FLOW 90               WAND BARREL [place] CHAIR WITH SPYDER SLING AFTER               SURGERY Diabetic - injectible 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 03/09/2021: TOTAL KNEE ARTHROPLASTY; Left     Comment:  Procedure: TOTAL KNEE ARTHROPLASTY - [place] to               Assist;  Surgeon: Kennedy Bucker, MD;  Location: [facility];              Service: Orthopedics;  Laterality: Left; 12/16/2021: TOTAL KNEE ARTHROPLASTY; Right     Comment:  Procedure: TOTAL KNEE ARTHROPLASTY;  Surgeon: Kennedy Bucker, MD;  Location: [facility];  Service: Orthopedics;               Laterality: Right; No date: TUBAL LIGATION 04/11/2016: VAGINAL HYSTERECTOMY; Bilateral     Comment:  Procedure: HYSTERECTOMY VAGINAL;  Surgeon: Herold Harms, MD;  Location: [facility];  Service:               Gynecology;  Laterality: Bilateral;  BMI    Body Mass Index: 27.98 kg/m      Reproductive/Obstetrics negative OB ROS                             Anesthesia Physical Anesthesia Plan  ASA: 3  Anesthesia Plan: General ETT and Rapid Sequence   Post-op Pain Management:    Induction: Intravenous  PONV Risk Score and Plan: Ondansetron, Dexamethasone, Midazolam and Treatment may vary due to age or medical condition  Airway Management Planned: Oral ETT  Additional Equipment:   Intra-op Plan:   Post-operative Plan: Extubation in OR  Informed Consent: I have  reviewed the patients History and Physical, chart, labs and discussed the procedure including the risks, benefits and alternatives for the proposed anesthesia with the patient or authorized representative who has indicated his/her understanding and acceptance.     Dental Advisory Given  Plan Discussed with: Anesthesiologist, CRNA and Surgeon  Anesthesia Plan Comments: (Patient consented for risks of anesthesia including but not limited to:  -  adverse reactions to medications - damage to eyes, teeth, lips or other oral mucosa - nerve damage due to positioning  - sore throat or hoarseness - Damage to heart, brain, nerves, lungs, other parts of body or loss of life  Patient voiced understanding.)       Anesthesia Quick Evaluation

## 2023-08-07 NOTE — ED Triage Notes (Addendum)
FIRST NURSE NOTE:  pt arrived via [facility] from home RUQ x 2 days, pain worse after eating, radiates to R arm, c/o nausea and diarrhea no vomiting  EKG -  142/71 P73 96% RA CBG 109  20G R AC

## 2023-08-07 NOTE — ED Notes (Signed)
Patient transported to Ultrasound 

## 2023-08-07 NOTE — Anesthesia Procedure Notes (Signed)
Procedure Name: Intubation Date/Time: 08/07/2023 12:19 PM  Performed by: Maryla Morrow., CRNAPre-anesthesia Checklist: Patient identified, Patient being monitored, Timeout performed, Emergency Drugs available and Suction available Patient Re-evaluated:Patient Re-evaluated prior to induction Oxygen Delivery Method: Circle system utilized Preoxygenation: Pre-oxygenation with 100% oxygen Induction Type: IV induction Ventilation: Mask ventilation without difficulty Laryngoscope Size: Miller and 2 Grade View: Grade I Tube type: Oral Tube size: 7.0 mm Number of attempts: 1 Airway Equipment and Method: Stylet Placement Confirmation: ETT inserted through vocal cords under direct vision, positive ETCO2 and breath sounds checked- equal and bilateral Secured at: 21 cm Tube secured with: Tape Dental Injury: Teeth and Oropharynx as per pre-operative assessment

## 2023-08-07 NOTE — Anesthesia Postprocedure Evaluation (Signed)
Anesthesia Post Note  Patient: Saide Kaska  Procedure(s) Performed: XI ROBOTIC ASSISTED LAPAROSCOPIC CHOLECYSTECTOMY (Abdomen)  Patient location during evaluation: PACU Anesthesia Type: General Level of consciousness: awake and alert Pain management: pain level controlled Vital Signs Assessment: post-procedure vital signs reviewed and stable Respiratory status: spontaneous breathing, nonlabored ventilation and respiratory function stable Cardiovascular status: blood pressure returned to baseline and stable Postop Assessment: no apparent nausea or vomiting Anesthetic complications: no   There were no known notable events for this encounter.   Last Vitals:  Vitals:   08/07/23 1355 08/07/23 1408  BP: 112/65 (!) 115/94  Pulse: 76 81  Resp: 17 20  Temp: 36.4 C (!) 36.4 C  SpO2: 94% 97%    Last Pain:  Vitals:   08/07/23 1428  TempSrc:   PainSc: 10-Worst pain ever                 Foye Deer

## 2023-08-07 NOTE — Transfer of Care (Signed)
Immediate Anesthesia Transfer of Care Note  Patient: Irean Gadow  Procedure(s) Performed: XI ROBOTIC ASSISTED LAPAROSCOPIC CHOLECYSTECTOMY (Abdomen)  Patient Location: PACU  Anesthesia Type:General  Level of Consciousness: awake and patient cooperative  Airway & Oxygen Therapy: Patient Spontanous Breathing  Post-op Assessment: Report given to RN and Post -op Vital signs reviewed and stable  Post vital signs: stable  Last Vitals:  Vitals Value Taken Time  BP 130/68 08/07/23 1326  Temp    Pulse 84 08/07/23 1328  Resp 11 08/07/23 1328  SpO2 95 % 08/07/23 1328  Vitals shown include unfiled device data.  Last Pain:  Vitals:   08/07/23 1110  TempSrc:   PainSc: 7       Patients Stated Pain Goal: 0 (08/07/23 0844)  Complications: No notable events documented.

## 2023-08-07 NOTE — ED Triage Notes (Signed)
Pt to ED via [facility] c/o right upper abd pain that started earlier today. Pain radiates to right arm. Pt endorses some nausea and diarrhea. Denies CP, SOB, fevers, dizziness.

## 2023-08-07 NOTE — ED Provider Notes (Signed)
[facility] Provider Note    Event Date/Time   First MD Initiated Contact with Patient 08/07/23 0217     (approximate)   History   Abdominal Pain   HPI Monica Terry is a 57 y.o. female who presents for evaluation of about 2 days of pain in her upper abdomen including the middle and the right upper quadrant.  She said that it always is worse after she eats and became very severe tonight.  She has had nausea as well as diarrhea but no vomiting but the symptoms were quite severe earlier this evening.  Prior to the last 2 days she has never experienced any symptoms like this in the past.  She does not drink alcohol.  She has chronic pain and goes to the [facility] pain clinic, but has never had this sort of pain in the past.  The pain in her abdomen radiates to her back and sometimes to her right shoulder/arm.  No chest pain specifically although sometimes the abdominal pain radiates up into her chest.  No recent fever or chills.     Physical Exam   Triage Vital Signs: ED Triage Vitals  Encounter Vitals Group     BP 08/07/23 0043 123/68     Systolic BP Percentile --      Diastolic BP Percentile --      Pulse Rate 08/07/23 0043 83     Resp 08/07/23 0043 17     Temp 08/07/23 0043 97.6 F (36.4 C)     Temp Source 08/07/23 0043 Oral     SpO2 08/07/23 0043 99 %     Weight 08/07/23 0038 83.5 kg (184 lb)     Height 08/07/23 0038 1.727 m (5\' 8" )     Head Circumference --      Peak Flow --      Pain Score 08/07/23 0038 9     Pain Loc --      Pain Education --      Exclude from Growth Chart --     Most recent vital signs: Vitals:   08/07/23 0300 08/07/23 0330  BP: (!) 124/92 128/68  Pulse: 81 68  Resp:  16  Temp:  98 F (36.7 C)  SpO2: 100% 100%    General: Awake, appears uncomfortable but not in severe distress. CV:  Good peripheral perfusion.  Regular rate and rhythm. Resp:  Normal effort. Speaking easily and comfortably, no accessory muscle usage  nor intercostal retractions.   Abd:  No distention.  Tender to palpation in the epigastrium and right upper quadrant with positive Murphy sign.  No lower abdominal tenderness. Other:  No jaundice nor scleral icterus   ED Results / Procedures / Treatments   Labs (all labs ordered are listed, but only abnormal results are displayed) Labs Reviewed  COMPREHENSIVE METABOLIC PANEL - Abnormal; Notable for the following components:      Result Value   Glucose, Bld 126 (*)    AST 118 (*)    ALT 46 (*)    Alkaline Phosphatase 132 (*)    All other components within normal limits  CBC - Abnormal; Notable for the following components:   WBC 16.1 (*)    All other components within normal limits  URINALYSIS, ROUTINE W REFLEX MICROSCOPIC - Abnormal; Notable for the following components:   Color, Urine YELLOW (*)    APPearance CLEAR (*)    Glucose, UA 50 (*)    All other components within normal limits  LIPASE, BLOOD  HIV ANTIBODY (ROUTINE TESTING W REFLEX)  TROPONIN I (HIGH SENSITIVITY)  TROPONIN I (HIGH SENSITIVITY)     EKG  ED ECG REPORT I, Loleta Rose, the attending physician, personally viewed and interpreted this ECG.  Date: 08/07/2023 EKG Time: 00: 40 Rate: 81 Rhythm: normal sinus rhythm QRS Axis: normal Intervals: normal ST/T Wave abnormalities: normal Narrative Interpretation: no evidence of acute ischemia    RADIOLOGY I viewed and interpreted the patient's abdominal ultrasound.  She has some gallstones but without evidence of cholecystitis such as thickened gallbladder wall or pericholecystic fluid.  Radiologist report agrees, cholelithiasis without evidence.  MRCP: See hospital course for details   PROCEDURES:  Critical Care performed: No  Procedures    IMPRESSION / MDM / ASSESSMENT AND PLAN / ED COURSE  I reviewed the triage vital signs and the nursing notes.                              Differential diagnosis includes, but is not limited to, biliary  colic or other more specific gallbladder disease such as cholecystitis, choledocholithiasis, gallbladder or liver neoplasm, pancreatitis, acid reflux, atypical ACS presentation.  Patient's presentation is most consistent with acute presentation with potential threat to life or bodily function.  Labs/studies ordered: High-sensitivity troponin x 2, CMP, lipase, CBC, urinalysis, right upper quadrant abdominal ultrasound, MRCP with and without contrast.  Interventions/Medications given:  Medications  SUMAtriptan (IMITREX) tablet 50 mg (has no administration in time range)  buPROPion (WELLBUTRIN XL) 24 hr tablet 150 mg (has no administration in time range)  nortriptyline (PAMELOR) capsule 10 mg (has no administration in time range)  levothyroxine (SYNTHROID) tablet 200 mcg (has no administration in time range)  docusate sodium (COLACE) capsule 100 mg (has no administration in time range)  naloxegol oxalate (MOVANTIK) tablet 12.5 mg (has no administration in time range)  polyethylene glycol (MIRALAX / GLYCOLAX) packet 17 g (has no administration in time range)  pregabalin (LYRICA) capsule 150 mg (has no administration in time range)  tiZANidine (ZANAFLEX) tablet 4 mg (has no administration in time range)  albuterol (PROVENTIL) (2.5 MG/3ML) 0.083% nebulizer solution 2.5 mg (has no administration in time range)  loratadine (CLARITIN) tablet 10 mg (has no administration in time range)  mometasone-formoterol (DULERA) 200-5 MCG/ACT inhaler 2 puff (has no administration in time range)  montelukast (SINGULAIR) tablet 10 mg (has no administration in time range)  tiotropium (SPIRIVA) inhalation capsule ([facility] use ONLY) 18 mcg (has no administration in time range)  piperacillin-tazobactam (ZOSYN) IVPB 3.375 g (has no administration in time range)  enoxaparin (LOVENOX) injection 40 mg (has no administration in time range)  0.9 %  sodium chloride infusion (has no administration in time range)  acetaminophen  (TYLENOL) tablet 650 mg (has no administration in time range)    Or  acetaminophen (TYLENOL) suppository 650 mg (has no administration in time range)  HYDROcodone-acetaminophen (NORCO/VICODIN) 5-325 MG per tablet 1-2 tablet (has no administration in time range)  morphine (PF) 4 MG/ML injection 4 mg (has no administration in time range)  ondansetron (ZOFRAN-ODT) disintegrating tablet 4 mg (has no administration in time range)    Or  ondansetron (ZOFRAN) injection 4 mg (has no administration in time range)  pantoprazole (PROTONIX) injection 40 mg (has no administration in time range)  lactated ringers bolus 1,000 mL (1,000 mLs Intravenous New Bag/Given 08/07/23 0321)  morphine (PF) 4 MG/ML injection 4 mg (4 mg Intravenous Given 08/07/23 0322)  ondansetron [facility]) injection 4 mg (4 mg Intravenous Given 08/07/23 0322)  ketorolac (TORADOL) 30 MG/ML injection 15 mg (15 mg Intravenous Given 08/07/23 0322)  gadobutrol (GADAVIST) 1 MMOL/ML injection 8 mL (8 mLs Intravenous Contrast Given 08/07/23 0411)    (Note:  hospital course my include additional interventions and/or labs/studies not listed above.)   Vital signs stable and within normal limits.  Symptoms seem very consistent with biliary colic, and that is still possible, but ultrasound was reassuring for cholecystitis but patient has elevated LFTs.  Is possible she may have choledocholithiasis, or had a stone which she has passed.  Neoplasm is less likely but also possible.  Leukocytosis of 16.1 is likely nonspecific in this setting given no other signs of acute infection.  Medications as listed above and I ordered an MRCP for thorough their assessment and ruling out choledocholithiasis and neoplasm.  Patient understands and agrees with the plan.  The patient is on the cardiac monitor to evaluate for evidence of arrhythmia and/or significant heart rate changes.   Clinical Course as of 08/07/23 0551  Mon Aug 07, 2023  0340 Troponin I (High  Sensitivity): 4 [CF]  0532 MR ABDOMEN MRCP W WO CONTAST I viewed and interpreted the MRCP.  I could not see any specific abnormalities other than cholelithiasis.  The radiologist confirmed multiple stones in the gallbladder and the gallbladder neck with possibly 1 in the cystic duct.  No evidence of choledocholithiasis and no clear evidence of cholecystitis.  I updated the patient and reassessed.  She is feeling better than before but still has quite a bit of tenderness in the right upper quadrant and epigastrium with guarding to palpation in the right upper quadrant.  She would much prefer to handle this surgically if possible.  I consulted Dr. Maia Plan with surgery and he will evaluate the patient's record and let me know his recommendations. [CF]  0550 Dr. Maia Plan is admitting the patient for management of her cholelithiasis [CF]    Clinical Course User Index [CF] Loleta Rose, MD     FINAL CLINICAL IMPRESSION(S) / ED DIAGNOSES   Final diagnoses:  Symptomatic cholelithiasis  Elevated LFTs  RUQ abdominal pain     Rx / DC Orders   ED Discharge Orders     None        Note:  This document was prepared using Dragon voice recognition software and may include unintentional dictation errors.   Loleta Rose, MD 08/07/23 [phone]

## 2023-08-08 LAB — GLUCOSE, CAPILLARY
Glucose-Capillary: 81 mg/dL (ref 70–99)
Glucose-Capillary: 89 mg/dL (ref 70–99)

## 2023-08-08 LAB — HEPATIC FUNCTION PANEL
ALT: 46 U/L — ABNORMAL HIGH (ref 0–44)
AST: 31 U/L (ref 15–41)
Albumin: 3.3 g/dL — ABNORMAL LOW (ref 3.5–5.0)
Alkaline Phosphatase: 90 U/L (ref 38–126)
Bilirubin, Direct: <0.1 mg/dL (ref 0.0–0.2)
Total Bilirubin: <0.1 mg/dL — ABNORMAL LOW (ref 0.3–1.2)
Total Protein: 6.5 g/dL (ref 6.5–8.1)

## 2023-08-08 MED ORDER — CYCLOBENZAPRINE HCL 10 MG PO TABS
5.0000 mg | ORAL_TABLET | Freq: Three times a day (TID) | ORAL | Status: DC | PRN
  Administered 2023-08-08: 5 mg via ORAL
  Filled 2023-08-08: qty 1

## 2023-08-08 MED ORDER — OXYCODONE HCL 20 MG PO TABS: 20.0000 mg | ORAL_TABLET | Freq: Four times a day (QID) | ORAL | 0 refills | Status: DC | PRN

## 2023-08-08 MED ORDER — SIMETHICONE 80 MG PO CHEW
80.0000 mg | CHEWABLE_TABLET | Freq: Four times a day (QID) | ORAL | Status: DC | PRN
  Administered 2023-08-08: 80 mg via ORAL
  Filled 2023-08-08: qty 1

## 2023-08-08 MED ORDER — KETOROLAC TROMETHAMINE 30 MG/ML IJ SOLN
30.0000 mg | Freq: Four times a day (QID) | INTRAMUSCULAR | Status: DC | PRN
  Administered 2023-08-08 (×2): 30 mg via INTRAVENOUS
  Filled 2023-08-08 (×2): qty 1

## 2023-08-08 MED ORDER — OXYCODONE HCL 5 MG PO TABS
20.0000 mg | ORAL_TABLET | Freq: Four times a day (QID) | ORAL | Status: DC | PRN
  Administered 2023-08-08: 20 mg via ORAL
  Filled 2023-08-08: qty 4

## 2023-08-08 NOTE — TOC CM/SW Note (Signed)
Transition of Care [facility]) - Inpatient Brief Assessment   Patient Details  Name: Monica Terry MRN: [id number] Date of Birth: [birth year]  Transition of Care [facility]) CM/SW Contact:    Chapman Fitch, RN Phone Number: 08/08/2023, 1:45 PM   Clinical Narrative:   Transition of Care (TOC) Screening Note   Patient Details  Name: Monica Terry Date of Birth: 10-Apr-1966   Transition of Care [facility]) CM/SW Contact:    Chapman Fitch, RN Phone Number: 08/08/2023, 1:45 PM    Transition of Care Department [facility]) has reviewed patient and no TOC needs have been identified at this time. We will continue to monitor patient advancement through interdisciplinary progression rounds. If new patient transition needs arise, please place a TOC consult.    Transition of Care Asessment: Insurance and Status: Insurance coverage has been reviewed Patient has primary care physician: Yes     Prior/Current Home Services: No current home services Social Determinants of Health Reivew: SDOH reviewed no interventions necessary Readmission risk has been reviewed: Yes Transition of care needs: no transition of care needs at this time

## 2023-08-08 NOTE — Plan of Care (Signed)
  Problem: Activity: Goal: Risk for activity intolerance will decrease Outcome: Progressing   Problem: Nutrition: Goal: Adequate nutrition will be maintained Outcome: Progressing   Problem: Coping: Goal: Level of anxiety will decrease Outcome: Progressing   Problem: Skin Integrity: Goal: Risk for impaired skin integrity will decrease Outcome: Progressing   Patient encouraged to walk more.   Madie Reno, RN

## 2023-08-08 NOTE — Discharge Instructions (Signed)
  Diet: Resume home heart healthy regular diet.   Activity: No heavy lifting >20 pounds (children, pets, laundry, garbage) or strenuous activity until follow-up, but light activity and walking are encouraged. Do not drive or drink alcohol if taking narcotic pain medications.  Wound care: May shower with soapy water and pat dry (do not rub incisions), but no baths or submerging incision underwater until follow-up. (no swimming)   Medications: Resume all home medications. For mild to moderate pain: acetaminophen (Tylenol) or ibuprofen (if no kidney disease). Combining Tylenol with alcohol can substantially increase your risk of causing liver disease. Narcotic pain medications, if prescribed, can be used for severe pain, though may cause nausea, constipation, and drowsiness.   Contact your Pain Specialist to let them know about the recent surgery for any adjustment on your pain medications.   Call office [phone]) at any time if any questions, worsening pain, fevers/chills, bleeding, drainage from incision site, or other concerns.

## 2023-08-08 NOTE — Discharge Summary (Signed)
Patient ID: Monica Terry MRN: [id number] DOB/AGE: 1966-02-20 57 y.o.  Admit date: 08/07/2023 Discharge date: 08/08/2023   Discharge Diagnoses:  Principal Problem:   Acute cholecystitis   Procedures: Robotic assisted laparoscopic cholecystectomy  Hospital Course: Patient admitted with acute cholecystitis.  She underwent robotic cholecystectomy.  She has been recovering well.  She has significant pain overnight but it was controlled with 20 mg of Percocet.  After reevaluation patient is pain-free.  She is very comfortable.  She tolerated diet.  Discussed with the patient to contact her pain specialist for any adjustment of her pain management after surgery.  Will discharge with short course of increased dose of Percocet.  Physical Exam Vitals reviewed.  HENT:     Head: Normocephalic.  Cardiovascular:     Rate and Rhythm: Normal rate and regular rhythm.  Pulmonary:     Effort: Pulmonary effort is normal.  Abdominal:     General: Abdomen is flat. Bowel sounds are normal.     Palpations: Abdomen is soft.     Tenderness: There is no abdominal tenderness.  Skin:    General: Skin is warm.     Capillary Refill: Capillary refill takes less than 2 seconds.  Neurological:     Mental Status: She is alert and oriented to person, place, and time.      Consults: None  Disposition: Discharge disposition: 01-Home or Self Care       Discharge Instructions     Diet - low sodium heart healthy   Complete by: As directed    Increase activity slowly   Complete by: As directed       Allergies as of 08/08/2023       Reactions   Vancomycin Rash        Medication List     TAKE these medications    albuterol (2.5 MG/3ML) 0.083% nebulizer solution Commonly known as: PROVENTIL Take 2.5 mg by nebulization every 6 (six) hours as needed for wheezing or shortness of breath.   albuterol 108 (90 Base) MCG/ACT inhaler Commonly known as: VENTOLIN HFA Inhale 2 puffs into the  lungs every 4 (four) hours as needed for wheezing or shortness of breath.   buPROPion 150 MG 24 hr tablet Commonly known as: WELLBUTRIN XL Take 150 mg daily by mouth.   cetirizine 10 MG tablet Commonly known as: ZYRTEC Take 10 mg by mouth daily.   docusate sodium 100 MG capsule Commonly known as: COLACE Take 100 mg by mouth 2 (two) times daily.   ergocalciferol 1.25 MG (50000 UT) capsule Commonly known as: VITAMIN D2 Take 50,000 Units by mouth every Tuesday.   estradiol 1 MG tablet Commonly known as: ESTRACE Take 1 tablet (1 mg total) by mouth daily.   fluticasone 50 MCG/ACT nasal spray Commonly known as: FLONASE Place 2 sprays into both nostrils daily as needed for allergies or rhinitis.   Fluticasone-Salmeterol 250-50 MCG/DOSE Aepb Commonly known as: ADVAIR Inhale 2 puffs 2 (two) times daily into the lungs.   levothyroxine 125 MCG tablet Commonly known as: SYNTHROID Take 125 mcg by mouth See admin instructions. Take 125 mcg on Sat and Sunday   levothyroxine 137 MCG tablet Commonly known as: SYNTHROID Take 137 mcg by mouth See admin instructions. Take 137 mcg Monday thru Friday   lubiprostone 8 MCG capsule Commonly known as: AMITIZA Take 8 mcg by mouth 2 (two) times daily with a meal.   metFORMIN 500 MG tablet Commonly known as: GLUCOPHAGE Take 500 mg by mouth 2 (two)  times daily with a meal.   montelukast 10 MG tablet Commonly known as: SINGULAIR Take 10 mg by mouth daily.   multivitamin with minerals Tabs tablet Take 1 tablet by mouth daily.   naloxegol oxalate 12.5 MG Tabs tablet Commonly known as: MOVANTIK Take 12.5 mg by mouth daily.   nortriptyline 10 MG capsule Commonly known as: PAMELOR Take 10 mg by mouth at bedtime.   ondansetron 4 MG disintegrating tablet Commonly known as: Zofran ODT Take 1 tablet (4 mg total) by mouth every 8 (eight) hours as needed for nausea or vomiting.   ondansetron 4 MG tablet Commonly known as: Zofran Take 1  tablet (4 mg total) by mouth daily as needed for nausea or vomiting.   Oxycodone HCl 10 MG Tabs Take 10 mg by mouth 4 (four) times daily as needed (pain). What changed: Another medication with the same name was added. Make sure you understand how and when to take each.   Oxycodone HCl 20 MG Tabs Take 1 tablet (20 mg total) by mouth 4 (four) times daily as needed for moderate pain or severe pain. What changed: You were already taking a medication with the same name, and this prescription was added. Make sure you understand how and when to take each.   OXYGEN Inhale 2 L into the lungs at bedtime.   Ozempic (2 MG/DOSE) 8 MG/3ML Sopn Generic drug: Semaglutide (2 MG/DOSE) Inject 2 mg into the skin every Tuesday.   polyethylene glycol 17 g packet Commonly known as: MIRALAX / GLYCOLAX Take 17 g by mouth daily as needed for mild constipation.   pregabalin 150 MG capsule Commonly known as: LYRICA Take 150 mg by mouth in the morning, at noon, and at bedtime.   rizatriptan 5 MG tablet Commonly known as: MAXALT Take 5 mg by mouth as needed for migraine. May repeat in 2 hours if needed   simvastatin 40 MG tablet Commonly known as: ZOCOR Take 40 mg by mouth at bedtime.   Spiriva HandiHaler 18 MCG inhalation capsule Generic drug: tiotropium Place 18 mcg into inhaler and inhale daily.   tiZANidine 4 MG tablet Commonly known as: ZANAFLEX Take 4 mg by mouth 3 (three) times daily as needed for muscle spasms.   Victoza 18 MG/3ML Sopn Generic drug: liraglutide Inject 1.8 mg into the skin every evening.        Follow-up Information     Carolan Shiver, MD Follow up in 2 week(s).   Specialty: General Surgery Why: follow up after cholecystitits Contact information: [address] [place] [address] [phone]

## 2023-08-11 ENCOUNTER — Encounter: Payer: Self-pay | Admitting: General Surgery

## 2023-10-30 ENCOUNTER — Other Ambulatory Visit: Payer: Self-pay | Admitting: Orthopedic Surgery

## 2023-11-07 ENCOUNTER — Other Ambulatory Visit: Payer: Self-pay

## 2023-11-07 ENCOUNTER — Encounter
Admission: RE | Admit: 2023-11-07 | Discharge: 2023-11-07 | Disposition: A | Discharge status: Home / Self Care | Payer: Medicaid Other | Source: Ambulatory Visit | Attending: Orthopedic Surgery | Admitting: Orthopedic Surgery

## 2023-11-07 VITALS — BP 99/77 | HR 61 | Temp 97.8°F | Resp 14 | Ht 68.0 in | Wt 190.0 lb

## 2023-11-07 DIAGNOSIS — Z01818 Encounter for other preprocedural examination: Secondary | ICD-10-CM | POA: Insufficient documentation

## 2023-11-07 DIAGNOSIS — Z22322 Carrier or suspected carrier of Methicillin resistant Staphylococcus aureus: Secondary | ICD-10-CM

## 2023-11-07 DIAGNOSIS — Z01812 Encounter for preprocedural laboratory examination: Secondary | ICD-10-CM

## 2023-11-07 HISTORY — DX: Radiculopathy, cervical region: M54.12

## 2023-11-07 HISTORY — DX: Acquired absence of spleen: Z90.81

## 2023-11-07 HISTORY — DX: Carpal tunnel syndrome, bilateral upper limbs: G56.03

## 2023-11-07 HISTORY — DX: Personal history of urinary calculi: Z87.442

## 2023-11-07 HISTORY — DX: Hyperlipidemia, unspecified: E78.5

## 2023-11-07 HISTORY — DX: Type 2 diabetes mellitus without complications: E11.9

## 2023-11-07 LAB — COMPREHENSIVE METABOLIC PANEL
ALT: 17 U/L (ref 0–44)
AST: 15 U/L (ref 15–41)
Albumin: 4.1 g/dL (ref 3.5–5.0)
Alkaline Phosphatase: 80 U/L (ref 38–126)
Anion gap: 8 (ref 5–15)
BUN: 14 mg/dL (ref 6–20)
CO2: 28 mmol/L (ref 22–32)
Calcium: 9.1 mg/dL (ref 8.9–10.3)
Chloride: 101 mmol/L (ref 98–111)
Creatinine, Ser: 0.66 mg/dL (ref 0.44–1.00)
GFR, Estimated: >60 mL/min (ref >60)
Glucose, Bld: 72 mg/dL (ref 70–99)
Potassium: 4.1 mmol/L (ref 3.5–5.1)
Sodium: 137 mmol/L (ref 135–145)
Total Bilirubin: 0.5 mg/dL (ref <1.2)
Total Protein: 7.7 g/dL (ref 6.5–8.1)

## 2023-11-07 LAB — URINALYSIS, ROUTINE W REFLEX MICROSCOPIC
Bilirubin Urine: NEGATIVE
Glucose, UA: NEGATIVE mg/dL
Hgb urine dipstick: NEGATIVE
Ketones, ur: NEGATIVE mg/dL
Leukocytes,Ua: NEGATIVE
Nitrite: NEGATIVE
Protein, ur: NEGATIVE mg/dL
Specific Gravity, Urine: 1.029 (ref 1.005–1.030)
pH: 5 (ref 5.0–8.0)

## 2023-11-07 LAB — CBC WITH DIFFERENTIAL/PLATELET
Abs Immature Granulocytes: 0.03 10*3/uL (ref 0.00–0.07)
Basophils Absolute: 0.1 10*3/uL (ref 0.0–0.1)
Basophils Relative: 1 %
Eosinophils Absolute: 0.2 10*3/uL (ref 0.0–0.5)
Eosinophils Relative: 2 %
HCT: 42.5 % (ref 36.0–46.0)
Hemoglobin: 13.8 g/dL (ref 12.0–15.0)
Immature Granulocytes: 0 %
Lymphocytes Relative: 47 %
Lymphs Abs: 5.3 10*3/uL — ABNORMAL HIGH (ref 0.7–4.0)
MCH: 29.6 pg (ref 26.0–34.0)
MCHC: 32.5 g/dL (ref 30.0–36.0)
MCV: 91.2 fL (ref 80.0–100.0)
Monocytes Absolute: 0.6 10*3/uL (ref 0.1–1.0)
Monocytes Relative: 5 %
Neutro Abs: 5 10*3/uL (ref 1.7–7.7)
Neutrophils Relative %: 45 %
Platelets: 408 10*3/uL — ABNORMAL HIGH (ref 150–400)
RBC: 4.66 MIL/uL (ref 3.87–5.11)
RDW: 13.9 % (ref 11.5–15.5)
WBC: 11.2 10*3/uL — ABNORMAL HIGH (ref 4.0–10.5)
nRBC: 0 % (ref 0.0–0.2)

## 2023-11-07 LAB — TYPE AND SCREEN
ABO/RH(D): O POS
Antibody Screen: NEGATIVE

## 2023-11-07 LAB — SURGICAL PCR SCREEN
MRSA, PCR: POSITIVE — AB
Staphylococcus aureus: POSITIVE — AB

## 2023-11-07 MED ORDER — MUPIROCIN 2 % EX OINT: TOPICAL_OINTMENT | CUTANEOUS | 0 refills | Status: AC

## 2023-11-07 NOTE — Progress Notes (Signed)
  Perioperative Services  Abnormal Lab Notification and Treatment Plan of Care  Date: 11/07/23  Name: Monica Terry MRN:   [id number]  Re: Abnormal labs noted during PAT appointment  Provider Notified: Reinaldo Berber, MD Notification mode: Routed and/or faxed via [facility]  Labs of concern: Lab Results  Component Value Date   STAPHAUREUS POSITIVE (A) 11/07/2023   MRSAPCR POSITIVE (A) 11/07/2023    Notes: Patient is scheduled for a TOTAL HIP ARTHROPLASTY ANTERIOR APPROACH (Right: Hip) on 11/13/2023. She is scheduled to receive CEFAZOLIN pre-operatively. Pre-surgical PCR (+) for MRSA; see above.  PLANS:  Review renal function. Estimated Creatinine Clearance: 88.1 mL/min (by C-G formula based on SCr of 0.66 mg/dL).  Review allergies. Vancomycin reported to cause "facial flushing". No IgE mediated reaction symptoms listed. Patient has received vancomycin several times in the past. Last received vancomycin inn 01/2022 (2 gram dose) and experienced facial flushing, erythema (unknown if at infusion site only or if it was diffuse/generalized), and pruritus. Symptoms reduced when infusion stopped. She was given 50 mg of oral diphenhydramine.   Discussed alternatives with anesthesia and surgery. Order added for *** to current preoperative prophylactic regimen.   Patient with orders for both CEFAZOLIN + *** to be given in the setting of documented MRSA (+) surgical PCR. Patient with allergy to first line vancomycin.    Medical history in [facility] updated to reflect (+) PCR result indicating nasal MRSA colonization   Rx for additional preoperative nasal decolonization sent to patient's pharmacy of record per MD request.   Meds ordered this encounter  Medications   mupirocin ointment (BACTROBAN) 2 %    Sig: Apply small amount to the inside of both nostrils TWICE a day for the next 5 days.    Dispense:  15 g    Refill:  0   Quentin Mulling, MSN, APRN, FNP-C, CEN [facility]   Perioperative Services Nurse Practitioner Phone: [phone] 11/07/23 3:32 PM

## 2023-11-07 NOTE — Patient Instructions (Addendum)
Your procedure is scheduled on: Monday, November 18 Report to the Registration Desk on the 1st floor of the [facility] To find out your arrival time, please call [phone] between 1PM - 3PM on: Friday, November 15 If your arrival time is 6:00 am, do not arrive before that time as the [facility] entrance doors do not open until 6:00 am.  REMEMBER: Instructions that are not followed completely may result in serious medical risk, up to and including death; or upon the discretion of your surgeon and anesthesiologist your surgery may need to be rescheduled.  Do not eat food after midnight the night before surgery.  No gum chewing or hard candies.  You may however, drink CLEAR liquids up to 2 hours before you are scheduled to arrive for your surgery. Do not drink anything within 2 hours of your scheduled arrival time.  Clear liquids include: - water   In addition, your doctor has ordered for you to drink the provided:  Gatorade G-2 Drinking this carbohydrate drink up to two hours before surgery helps to reduce insulin resistance and improve patient outcomes. Please complete drinking 2 hours before scheduled arrival time.  One week prior to surgery: starting today, November 12 Stop meloxicam and Anti-inflammatories (NSAIDS) such as Advil, Aleve, Ibuprofen, Motrin, Naproxen, Naprosyn and Aspirin based products such as Excedrin, Goody's Powder, BC Powder. Stop ANY OVER THE COUNTER supplements until after surgery.  You may however, continue to take Tylenol if needed for pain up until the day of surgery.  Metformin - hold for 2 days before surgery. Last day to take is Friday, November 15. Resume AFTER surgery.  Ozempic - hold for 7 days before surgery. Do NOT take Ozempic on Tuesday, November 12. Resume AFTER surgery on your regular weekly day, Tuesday, November 19.  Continue taking all of your other prescription medications up until the day of surgery.  ON THE DAY OF SURGERY ONLY TAKE  THESE MEDICATIONS WITH SIPS OF WATER:  Albuterol nebulizer Bupropion (Wellbutrin) Advair inhaler Levothyroxine Pregabalin (Lyrica) Oxycodone if needed for pain  Use inhalers on the day of surgery and bring your albuterol inhaler to the hospital.  No Alcohol for 24 hours before or after surgery.  No Smoking including e-cigarettes for 24 hours before surgery.  No chewable tobacco products for at least 6 hours before surgery.  No nicotine patches on the day of surgery.  Do not use any "recreational" drugs for at least a week (preferably 2 weeks) before your surgery.  Please be advised that the combination of cocaine and anesthesia may have negative outcomes, up to and including death. If you test positive for cocaine, your surgery will be cancelled.  On the morning of surgery brush your teeth with toothpaste and water, you may rinse your mouth with mouthwash if you wish. Do not swallow any toothpaste or mouthwash.  Use CHG Soap as directed on instruction sheet.  Do not wear jewelry, make-up, hairpins, clips or nail polish.  For welded (permanent) jewelry: bracelets, anklets, waist bands, etc.  Please have this removed prior to surgery.  If it is not removed, there is a chance that hospital personnel will need to cut it off on the day of surgery.  Do not wear lotions, powders, or perfumes.   Do not shave body hair from the neck down 48 hours before surgery.  Contact lenses, hearing aids and dentures may not be worn into surgery.  Do not bring valuables to the hospital. [facility] is not  responsible for any missing/lost belongings or valuables.   Notify your doctor if there is any change in your medical condition (cold, fever, infection).  Wear comfortable clothing (specific to your surgery type) to the hospital.  After surgery, you can help prevent lung complications by doing breathing exercises.  Take deep breaths and cough every 1-2 hours. Your doctor may order a device  called an Incentive Spirometer to help you take deep breaths.  If you are being admitted to the hospital overnight, leave your suitcase in the car. After surgery it may be brought to your room.  In case of increased patient census, it may be necessary for you, the patient, to continue your postoperative care in the Same Day Surgery department.  If you are being discharged the day of surgery, you will not be allowed to drive home. You will need a responsible individual to drive you home and stay with you for 24 hours after surgery.   If you are taking public transportation, you will need to have a responsible individual with you.  Please call the Pre-admissions Testing Dept. at [phone] if you have any questions about these instructions.  Surgery Visitation Policy:  Patients having surgery or a procedure may have two visitors.  Children under the age of 61 must have an adult with them who is not the patient.  Inpatient Visitation:    Visiting hours are 7 a.m. to 8 p.m. Up to four visitors are allowed at one time in a patient room. The visitors may rotate out with other people during the day.  One visitor age 38 or older may stay with the patient overnight and must be in the room by 8 p.m.    Pre-operative 5 CHG Bath Instructions   You can play a key role in reducing the risk of infection after surgery. Your skin needs to be as free of germs as possible. You can reduce the number of germs on your skin by washing with CHG (chlorhexidine gluconate) soap before surgery. CHG is an antiseptic soap that kills germs and continues to kill germs even after washing.   DO NOT use if you have an allergy to chlorhexidine/CHG or antibacterial soaps. If your skin becomes reddened or irritated, stop using the CHG and notify one of our RNs at [phone].   Please shower with the CHG soap starting 4 days before surgery using the following schedule:     Please keep in mind the following:  DO  NOT shave, including legs and underarms, starting the day of your first shower.   You may shave your face at any point before/day of surgery.  Place clean sheets on your bed the day you start using CHG soap. Use a clean washcloth (not used since being washed) for each shower. DO NOT sleep with pets once you start using the CHG.   CHG Shower Instructions:  If you choose to wash your hair and private area, wash first with your normal shampoo/soap.  After you use shampoo/soap, rinse your hair and body thoroughly to remove shampoo/soap residue.  Turn the water OFF and apply about 3 tablespoons (45 ml) of CHG soap to a CLEAN washcloth.  Apply CHG soap ONLY FROM YOUR NECK DOWN TO YOUR TOES (washing for 3-5 minutes)  DO NOT use CHG soap on face, private areas, open wounds, or sores.  Pay special attention to the area where your surgery is being performed.  If you are having back surgery, having someone wash your back  for you may be helpful. Wait 2 minutes after CHG soap is applied, then you may rinse off the CHG soap.  Pat dry with a clean towel  Put on clean clothes/pajamas   If you choose to wear lotion, please use ONLY the CHG-compatible lotions on the back of this paper.     Additional instructions for the day of surgery: DO NOT APPLY any lotions, deodorants, cologne, or perfumes.   Put on clean/comfortable clothes.  Brush your teeth.  Ask your nurse before applying any prescription medications to the skin.      CHG Compatible Lotions   Aveeno Moisturizing lotion  Cetaphil Moisturizing Cream  Cetaphil Moisturizing Lotion  Clairol Herbal Essence Moisturizing Lotion, Dry Skin  Clairol Herbal Essence Moisturizing Lotion, Extra Dry Skin  Clairol Herbal Essence Moisturizing Lotion, Normal Skin  Curel Age Defying Therapeutic Moisturizing Lotion with Alpha Hydroxy  Curel Extreme Care Body Lotion  Curel Soothing Hands Moisturizing Hand Lotion  Curel Therapeutic Moisturizing Cream,  Fragrance-Free  Curel Therapeutic Moisturizing Lotion, Fragrance-Free  Curel Therapeutic Moisturizing Lotion, Original Formula  Eucerin Daily Replenishing Lotion  Eucerin Dry Skin Therapy Plus Alpha Hydroxy Crme  Eucerin Dry Skin Therapy Plus Alpha Hydroxy Lotion  Eucerin Original Crme  Eucerin Original Lotion  Eucerin Plus Crme Eucerin Plus Lotion  Eucerin TriLipid Replenishing Lotion  Keri Anti-Bacterial Hand Lotion  Keri Deep Conditioning Original Lotion Dry Skin Formula Softly Scented  Keri Deep Conditioning Original Lotion, Fragrance Free Sensitive Skin Formula  Keri Lotion Fast Absorbing Fragrance Free Sensitive Skin Formula  Keri Lotion Fast Absorbing Softly Scented Dry Skin Formula  Keri Original Lotion  Keri Skin Renewal Lotion Keri Silky Smooth Lotion  Keri Silky Smooth Sensitive Skin Lotion  Nivea Body Creamy Conditioning Oil  Nivea Body Extra Enriched Lotion  Nivea Body Original Lotion  Nivea Body Sheer Moisturizing Lotion Nivea Crme  Nivea Skin Firming Lotion  NutraDerm 30 Skin Lotion  NutraDerm Skin Lotion  NutraDerm Therapeutic Skin Cream  NutraDerm Therapeutic Skin Lotion  ProShield Protective Hand Cream  Provon moisturizing lotion    Preoperative Educational Videos for Total Hip, Knee and Shoulder Replacements  To better prepare for surgery, please view our videos that explain the physical activity and discharge planning required to have the best surgical recovery at [facility].  [email or web address]  Questions? Call [phone] or email [email or web address]

## 2023-11-09 ENCOUNTER — Encounter: Payer: Self-pay | Admitting: Internal Medicine

## 2023-11-13 ENCOUNTER — Encounter: Payer: Self-pay | Admitting: Orthopedic Surgery

## 2023-11-13 ENCOUNTER — Ambulatory Visit: Payer: Medicaid Other | Admitting: Urgent Care

## 2023-11-13 ENCOUNTER — Ambulatory Visit: Payer: Medicaid Other

## 2023-11-13 ENCOUNTER — Encounter
Admission: RE | Disposition: A | Discharge status: Home Health | Payer: Self-pay | Source: Home / Self Care | Attending: Orthopedic Surgery

## 2023-11-13 ENCOUNTER — Other Ambulatory Visit: Payer: Self-pay

## 2023-11-13 ENCOUNTER — Observation Stay
Admission: RE | Admit: 2023-11-13 | Discharge: 2023-11-14 | Disposition: A | Discharge status: Home Health | Payer: Medicaid Other | Attending: Orthopedic Surgery | Admitting: Orthopedic Surgery

## 2023-11-13 DIAGNOSIS — M1611 Unilateral primary osteoarthritis, right hip: Secondary | ICD-10-CM | POA: Diagnosis present

## 2023-11-13 DIAGNOSIS — E119 Type 2 diabetes mellitus without complications: Secondary | ICD-10-CM | POA: Diagnosis not present

## 2023-11-13 DIAGNOSIS — Z79899 Other long term (current) drug therapy: Secondary | ICD-10-CM | POA: Diagnosis not present

## 2023-11-13 DIAGNOSIS — J449 Chronic obstructive pulmonary disease, unspecified: Secondary | ICD-10-CM | POA: Insufficient documentation

## 2023-11-13 DIAGNOSIS — Z7984 Long term (current) use of oral hypoglycemic drugs: Secondary | ICD-10-CM | POA: Diagnosis not present

## 2023-11-13 DIAGNOSIS — Z96653 Presence of artificial knee joint, bilateral: Secondary | ICD-10-CM | POA: Insufficient documentation

## 2023-11-13 DIAGNOSIS — Z96641 Presence of right artificial hip joint: Principal | ICD-10-CM | POA: Diagnosis present

## 2023-11-13 DIAGNOSIS — E039 Hypothyroidism, unspecified: Secondary | ICD-10-CM | POA: Insufficient documentation

## 2023-11-13 DIAGNOSIS — Z01812 Encounter for preprocedural laboratory examination: Secondary | ICD-10-CM

## 2023-11-13 HISTORY — PX: TOTAL HIP ARTHROPLASTY: SHX124

## 2023-11-13 LAB — GLUCOSE, CAPILLARY
Glucose-Capillary: 87 mg/dL (ref 70–99)
Glucose-Capillary: 124 mg/dL — ABNORMAL HIGH (ref 70–99)
Glucose-Capillary: 102 mg/dL — ABNORMAL HIGH (ref 70–99)
Glucose-Capillary: 162 mg/dL — ABNORMAL HIGH (ref 70–99)
Glucose-Capillary: 149 mg/dL — ABNORMAL HIGH (ref 70–99)

## 2023-11-13 SURGERY — ARTHROPLASTY, HIP, TOTAL, ANTERIOR APPROACH
Anesthesia: Spinal | Site: Hip | Laterality: Right

## 2023-11-13 MED ORDER — ACETAMINOPHEN 10 MG/ML IV SOLN
INTRAVENOUS | Status: AC
  Filled 2023-11-13: qty 100

## 2023-11-13 MED ORDER — ALBUMIN HUMAN 5 % IV SOLN
INTRAVENOUS | Status: AC
Start: 2023-11-13 — End: ?
  Filled 2023-11-13: qty 250

## 2023-11-13 MED ORDER — OXYCODONE HCL 5 MG PO TABS
ORAL_TABLET | ORAL | Status: AC
  Filled 2023-11-13: qty 2

## 2023-11-13 MED ORDER — ACETAMINOPHEN 500 MG PO TABS
ORAL_TABLET | ORAL | Status: AC
  Filled 2023-11-13: qty 2

## 2023-11-13 MED ORDER — LURASIDONE HCL 40 MG PO TABS
80.0000 mg | ORAL_TABLET | Freq: Every day | ORAL | Status: DC
  Administered 2023-11-14: 80 mg via ORAL
  Filled 2023-11-13: qty 1
  Filled 2023-11-13: qty 2

## 2023-11-13 MED ORDER — PANTOPRAZOLE SODIUM 40 MG PO TBEC
40.0000 mg | DELAYED_RELEASE_TABLET | Freq: Every day | ORAL | Status: DC
  Administered 2023-11-14: 40 mg via ORAL

## 2023-11-13 MED ORDER — METOCLOPRAMIDE HCL 5 MG/ML IJ SOLN: 5.0000 mg | Freq: Three times a day (TID) | INTRAMUSCULAR | Status: DC | PRN

## 2023-11-13 MED ORDER — KETOROLAC TROMETHAMINE 15 MG/ML IJ SOLN
INTRAMUSCULAR | Status: AC
Start: 2023-11-13 — End: ?
  Filled 2023-11-13: qty 1

## 2023-11-13 MED ORDER — DOCUSATE SODIUM 100 MG PO CAPS
100.0000 mg | ORAL_CAPSULE | Freq: Two times a day (BID) | ORAL | Status: DC
Start: 2023-11-13 — End: 2023-11-14
  Administered 2023-11-13 – 2023-11-14 (×2): 100 mg via ORAL

## 2023-11-13 MED ORDER — ONDANSETRON HCL 4 MG PO TABS: 4.0000 mg | ORAL_TABLET | Freq: Four times a day (QID) | ORAL | Status: DC | PRN

## 2023-11-13 MED ORDER — PROPOFOL 10 MG/ML IV BOLUS
INTRAVENOUS | Status: DC | PRN
  Administered 2023-11-13: 20 mg via INTRAVENOUS
  Administered 2023-11-13: 40 mg via INTRAVENOUS
  Administered 2023-11-13 (×4): 20 mg via INTRAVENOUS

## 2023-11-13 MED ORDER — 0.9 % SODIUM CHLORIDE (POUR BTL) OPTIME
TOPICAL | Status: DC | PRN
  Administered 2023-11-13: 500 mL

## 2023-11-13 MED ORDER — HYDROMORPHONE HCL 1 MG/ML IJ SOLN
INTRAMUSCULAR | Status: AC
  Filled 2023-11-13: qty 0.5

## 2023-11-13 MED ORDER — KETOROLAC TROMETHAMINE 30 MG/ML IJ SOLN
INTRAMUSCULAR | Status: AC
  Filled 2023-11-13: qty 1

## 2023-11-13 MED ORDER — KETOROLAC TROMETHAMINE 15 MG/ML IJ SOLN
7.5000 mg | Freq: Four times a day (QID) | INTRAMUSCULAR | Status: AC
  Administered 2023-11-13 – 2023-11-14 (×4): 7.5 mg via INTRAVENOUS

## 2023-11-13 MED ORDER — METOCLOPRAMIDE HCL 10 MG PO TABS: 5.0000 mg | ORAL_TABLET | Freq: Three times a day (TID) | ORAL | Status: DC | PRN

## 2023-11-13 MED ORDER — ACETAMINOPHEN 10 MG/ML IV SOLN
INTRAVENOUS | Status: DC | PRN
  Administered 2023-11-13: 1000 mg via INTRAVENOUS

## 2023-11-13 MED ORDER — NORTRIPTYLINE HCL 10 MG PO CAPS
10.0000 mg | ORAL_CAPSULE | Freq: Every day | ORAL | Status: DC
Start: 2023-11-13 — End: 2023-11-14
  Administered 2023-11-13: 10 mg via ORAL
  Filled 2023-11-13: qty 1

## 2023-11-13 MED ORDER — PHENYLEPHRINE 80 MCG/ML (10ML) SYRINGE FOR IV PUSH (FOR BLOOD PRESSURE SUPPORT)
PREFILLED_SYRINGE | INTRAVENOUS | Status: DC | PRN
  Administered 2023-11-13 (×2): 120 ug via INTRAVENOUS
  Administered 2023-11-13: 160 ug via INTRAVENOUS
  Administered 2023-11-13: 120 ug via INTRAVENOUS

## 2023-11-13 MED ORDER — CHLORHEXIDINE GLUCONATE 4 % EX SOLN: 1.0000 | CUTANEOUS | 1 refills | Status: AC

## 2023-11-13 MED ORDER — TRANEXAMIC ACID-NACL 1000-0.7 MG/100ML-% IV SOLN
INTRAVENOUS | Status: AC
  Filled 2023-11-13: qty 100

## 2023-11-13 MED ORDER — ONDANSETRON HCL 4 MG/2ML IJ SOLN
INTRAMUSCULAR | Status: AC
  Filled 2023-11-13: qty 2

## 2023-11-13 MED ORDER — BUPIVACAINE-EPINEPHRINE (PF) 0.25% -1:200000 IJ SOLN
INTRAMUSCULAR | Status: AC
  Filled 2023-11-13: qty 30

## 2023-11-13 MED ORDER — KETOROLAC TROMETHAMINE 15 MG/ML IJ SOLN
INTRAMUSCULAR | Status: AC
  Filled 2023-11-13: qty 1

## 2023-11-13 MED ORDER — EPHEDRINE SULFATE-NACL 50-0.9 MG/10ML-% IV SOSY
PREFILLED_SYRINGE | INTRAVENOUS | Status: DC | PRN
  Administered 2023-11-13 (×5): 5 mg via INTRAVENOUS

## 2023-11-13 MED ORDER — KETOROLAC TROMETHAMINE 30 MG/ML IJ SOLN
INTRAMUSCULAR | Status: DC | PRN
  Administered 2023-11-13: 30 mg via INTRAVENOUS

## 2023-11-13 MED ORDER — ALBUTEROL SULFATE (2.5 MG/3ML) 0.083% IN NEBU: 2.5000 mg | INHALATION_SOLUTION | Freq: Four times a day (QID) | RESPIRATORY_TRACT | Status: DC | PRN

## 2023-11-13 MED ORDER — MENTHOL 3 MG MT LOZG: 1.0000 | LOZENGE | OROMUCOSAL | Status: DC | PRN

## 2023-11-13 MED ORDER — ONDANSETRON HCL 4 MG/2ML IJ SOLN: 4.0000 mg | Freq: Four times a day (QID) | INTRAMUSCULAR | Status: DC | PRN

## 2023-11-13 MED ORDER — DOCUSATE SODIUM 100 MG PO CAPS
ORAL_CAPSULE | ORAL | Status: AC
  Filled 2023-11-13: qty 1

## 2023-11-13 MED ORDER — CLINDAMYCIN PHOSPHATE 900 MG/50ML IV SOLN
INTRAVENOUS | Status: AC
  Filled 2023-11-13: qty 50

## 2023-11-13 MED ORDER — FENTANYL CITRATE (PF) 100 MCG/2ML IJ SOLN
INTRAMUSCULAR | Status: AC
  Filled 2023-11-13: qty 2

## 2023-11-13 MED ORDER — SODIUM CHLORIDE 0.9 % IR SOLN
Status: DC | PRN
  Administered 2023-11-13: 250 mL

## 2023-11-13 MED ORDER — TIOTROPIUM BROMIDE MONOHYDRATE 18 MCG IN CAPS
18.0000 ug | ORAL_CAPSULE | Freq: Every day | RESPIRATORY_TRACT | Status: DC
  Administered 2023-11-14: 18 ug via RESPIRATORY_TRACT
  Filled 2023-11-13: qty 5

## 2023-11-13 MED ORDER — DEXAMETHASONE SODIUM PHOSPHATE 10 MG/ML IJ SOLN
8.0000 mg | Freq: Once | INTRAMUSCULAR | Status: AC
  Administered 2023-11-13: 10 mg via INTRAVENOUS

## 2023-11-13 MED ORDER — PREGABALIN 50 MG PO CAPS
ORAL_CAPSULE | ORAL | Status: AC
  Filled 2023-11-13: qty 3

## 2023-11-13 MED ORDER — PHENYLEPHRINE HCL-NACL 20-0.9 MG/250ML-% IV SOLN
INTRAVENOUS | Status: AC
  Filled 2023-11-13: qty 250

## 2023-11-13 MED ORDER — HYDROMORPHONE HCL 1 MG/ML IJ SOLN
0.5000 mg | INTRAMUSCULAR | Status: DC | PRN
  Administered 2023-11-13 (×2): 0.5 mg via INTRAVENOUS

## 2023-11-13 MED ORDER — MIDAZOLAM HCL 5 MG/5ML IJ SOLN
INTRAMUSCULAR | Status: DC | PRN
  Administered 2023-11-13: 2 mg via INTRAVENOUS

## 2023-11-13 MED ORDER — ONDANSETRON HCL 4 MG/2ML IJ SOLN
INTRAMUSCULAR | Status: DC | PRN
  Administered 2023-11-13: 4 mg via INTRAVENOUS

## 2023-11-13 MED ORDER — PROPOFOL 1000 MG/100ML IV EMUL
INTRAVENOUS | Status: AC
  Filled 2023-11-13: qty 100

## 2023-11-13 MED ORDER — SODIUM CHLORIDE 0.9 % IV SOLN: INTRAVENOUS | Status: DC

## 2023-11-13 MED ORDER — FENTANYL CITRATE (PF) 100 MCG/2ML IJ SOLN
INTRAMUSCULAR | Status: DC | PRN
  Administered 2023-11-13 (×2): 50 ug via INTRAVENOUS

## 2023-11-13 MED ORDER — ACETAMINOPHEN 325 MG PO TABS: 325.0000 mg | ORAL_TABLET | Freq: Four times a day (QID) | ORAL | Status: DC | PRN

## 2023-11-13 MED ORDER — CHLORHEXIDINE GLUCONATE 0.12 % MT SOLN
15.0000 mL | Freq: Once | OROMUCOSAL | Status: AC
Start: 2023-11-13 — End: 2023-11-13
  Administered 2023-11-13: 15 mL via OROMUCOSAL

## 2023-11-13 MED ORDER — LUBIPROSTONE 24 MCG PO CAPS
24.0000 ug | ORAL_CAPSULE | Freq: Two times a day (BID) | ORAL | Status: DC
  Administered 2023-11-13 – 2023-11-14 (×2): 24 ug via ORAL
  Filled 2023-11-13 (×3): qty 1

## 2023-11-13 MED ORDER — CEFAZOLIN SODIUM-DEXTROSE 2-4 GM/100ML-% IV SOLN
INTRAVENOUS | Status: AC
  Filled 2023-11-13: qty 100

## 2023-11-13 MED ORDER — FENTANYL CITRATE (PF) 100 MCG/2ML IJ SOLN
25.0000 ug | INTRAMUSCULAR | Status: DC | PRN
  Administered 2023-11-13 (×6): 25 ug via INTRAVENOUS

## 2023-11-13 MED ORDER — PREGABALIN 50 MG PO CAPS
150.0000 mg | ORAL_CAPSULE | Freq: Three times a day (TID) | ORAL | Status: DC
  Administered 2023-11-13 – 2023-11-14 (×3): 150 mg via ORAL
  Filled 2023-11-13: qty 3

## 2023-11-13 MED ORDER — KETAMINE HCL 50 MG/5ML IJ SOSY
PREFILLED_SYRINGE | INTRAMUSCULAR | Status: DC | PRN
  Administered 2023-11-13: 20 mg via INTRAVENOUS

## 2023-11-13 MED ORDER — TIZANIDINE HCL 4 MG PO TABS
4.0000 mg | ORAL_TABLET | Freq: Four times a day (QID) | ORAL | Status: DC | PRN
  Administered 2023-11-13 (×2): 4 mg via ORAL
  Filled 2023-11-13 (×3): qty 1

## 2023-11-13 MED ORDER — PHENOL 1.4 % MT LIQD: 1.0000 | OROMUCOSAL | Status: DC | PRN

## 2023-11-13 MED ORDER — SODIUM CHLORIDE FLUSH 0.9 % IV SOLN
INTRAVENOUS | Status: AC
Start: 2023-11-13 — End: ?
  Filled 2023-11-13: qty 10

## 2023-11-13 MED ORDER — PROPOFOL 500 MG/50ML IV EMUL
INTRAVENOUS | Status: DC | PRN
  Administered 2023-11-13: 100 ug/kg/min via INTRAVENOUS

## 2023-11-13 MED ORDER — CLINDAMYCIN PHOSPHATE 600 MG/50ML IV SOLN
900.0000 mg | Freq: Once | INTRAVENOUS | Status: AC
  Administered 2023-11-13: 900 mg via INTRAVENOUS

## 2023-11-13 MED ORDER — MUPIROCIN 2 % EX OINT: 1.0000 | TOPICAL_OINTMENT | Freq: Two times a day (BID) | CUTANEOUS | 0 refills | Status: AC

## 2023-11-13 MED ORDER — LIDOCAINE HCL (CARDIAC) PF 100 MG/5ML IV SOSY
PREFILLED_SYRINGE | INTRAVENOUS | Status: DC | PRN
  Administered 2023-11-13: 60 mg via INTRAVENOUS

## 2023-11-13 MED ORDER — BUPIVACAINE HCL (PF) 0.5 % IJ SOLN
INTRAMUSCULAR | Status: DC | PRN
  Administered 2023-11-13: 3 mL

## 2023-11-13 MED ORDER — MIDAZOLAM HCL 2 MG/2ML IJ SOLN
INTRAMUSCULAR | Status: AC
Start: 2023-11-13 — End: ?
  Filled 2023-11-13: qty 2

## 2023-11-13 MED ORDER — SODIUM CHLORIDE 0.9 % IV SOLN
INTRAVENOUS | Status: DC
Start: 2023-11-13 — End: 2023-11-13

## 2023-11-13 MED ORDER — ONDANSETRON HCL 4 MG/2ML IJ SOLN
4.0000 mg | Freq: Once | INTRAMUSCULAR | Status: DC | PRN
Start: 2023-11-13 — End: 2023-11-13

## 2023-11-13 MED ORDER — TRANEXAMIC ACID-NACL 1000-0.7 MG/100ML-% IV SOLN
1000.0000 mg | INTRAVENOUS | Status: AC
Start: 2023-11-13 — End: 2023-11-13
  Administered 2023-11-13 (×2): 1000 mg via INTRAVENOUS

## 2023-11-13 MED ORDER — OXYCODONE HCL 5 MG PO TABS
10.0000 mg | ORAL_TABLET | ORAL | Status: DC | PRN
  Administered 2023-11-13 (×2): 10 mg via ORAL

## 2023-11-13 MED ORDER — SODIUM CHLORIDE (PF) 0.9 % IJ SOLN
INTRAMUSCULAR | Status: DC | PRN
  Administered 2023-11-13: 50 mL

## 2023-11-13 MED ORDER — PHENYLEPHRINE HCL-NACL 20-0.9 MG/250ML-% IV SOLN
INTRAVENOUS | Status: DC | PRN
  Administered 2023-11-13: 20 ug/min via INTRAVENOUS

## 2023-11-13 MED ORDER — ACETAMINOPHEN 500 MG PO TABS
1000.0000 mg | ORAL_TABLET | Freq: Four times a day (QID) | ORAL | Status: DC
  Administered 2023-11-13 – 2023-11-14 (×2): 1000 mg via ORAL

## 2023-11-13 MED ORDER — KETAMINE HCL 50 MG/5ML IJ SOSY
PREFILLED_SYRINGE | INTRAMUSCULAR | Status: AC
  Filled 2023-11-13: qty 5

## 2023-11-13 MED ORDER — LIDOCAINE HCL (PF) 2 % IJ SOLN
INTRAMUSCULAR | Status: AC
  Filled 2023-11-13: qty 5

## 2023-11-13 MED ORDER — CEFAZOLIN SODIUM-DEXTROSE 2-4 GM/100ML-% IV SOLN
2.0000 g | Freq: Four times a day (QID) | INTRAVENOUS | Status: AC
  Administered 2023-11-13 (×2): 2 g via INTRAVENOUS

## 2023-11-13 MED ORDER — BUPIVACAINE HCL (PF) 0.5 % IJ SOLN
INTRAMUSCULAR | Status: AC
  Filled 2023-11-13: qty 10

## 2023-11-13 MED ORDER — BUPIVACAINE LIPOSOME 1.3 % IJ SUSP
INTRAMUSCULAR | Status: AC
  Filled 2023-11-13: qty 20

## 2023-11-13 MED ORDER — SURGIPHOR WOUND IRRIGATION SYSTEM - OPTIME: TOPICAL | Status: DC | PRN

## 2023-11-13 MED ORDER — ENOXAPARIN SODIUM 40 MG/0.4ML IJ SOSY
40.0000 mg | PREFILLED_SYRINGE | INTRAMUSCULAR | Status: DC
  Administered 2023-11-14: 40 mg via SUBCUTANEOUS

## 2023-11-13 MED ORDER — MONTELUKAST SODIUM 10 MG PO TABS
10.0000 mg | ORAL_TABLET | Freq: Every day | ORAL | Status: DC
  Administered 2023-11-14: 10 mg via ORAL
  Filled 2023-11-13: qty 1

## 2023-11-13 MED ORDER — CEFAZOLIN SODIUM-DEXTROSE 2-4 GM/100ML-% IV SOLN
2.0000 g | INTRAVENOUS | Status: AC
  Administered 2023-11-13: 2 g via INTRAVENOUS

## 2023-11-13 MED ORDER — OXYCODONE HCL 5 MG PO TABS
ORAL_TABLET | ORAL | Status: AC
  Filled 2023-11-13: qty 3

## 2023-11-13 MED ORDER — SIMVASTATIN 40 MG PO TABS
40.0000 mg | ORAL_TABLET | Freq: Every day | ORAL | Status: DC
  Administered 2023-11-13: 40 mg via ORAL
  Filled 2023-11-13: qty 1

## 2023-11-13 MED ORDER — BUPROPION HCL ER (XL) 150 MG PO TB24
150.0000 mg | ORAL_TABLET | Freq: Every day | ORAL | Status: DC
  Administered 2023-11-14: 150 mg via ORAL
  Filled 2023-11-13: qty 1

## 2023-11-13 MED ORDER — OXYCODONE HCL 5 MG PO TABS
15.0000 mg | ORAL_TABLET | Freq: Four times a day (QID) | ORAL | Status: DC | PRN
  Administered 2023-11-13 – 2023-11-14 (×2): 15 mg via ORAL

## 2023-11-13 MED ORDER — ORAL CARE MOUTH RINSE: 15.0000 mL | Freq: Once | OROMUCOSAL | Status: AC

## 2023-11-13 MED ORDER — LEVOTHYROXINE SODIUM 125 MCG PO TABS
125.0000 ug | ORAL_TABLET | Freq: Every day | ORAL | Status: DC
  Administered 2023-11-14: 125 ug via ORAL
  Filled 2023-11-13: qty 1

## 2023-11-13 MED ORDER — ALBUMIN HUMAN 5 % IV SOLN: INTRAVENOUS | Status: DC | PRN

## 2023-11-13 SURGICAL SUPPLY — 67 items
BLADE SAGITTAL AGGR TOOTH XLG (BLADE) ×1 IMPLANT
BNDG COHESIVE 6X5 TAN ST LF (GAUZE/BANDAGES/DRESSINGS) ×2 IMPLANT
CHLORAPREP W/TINT 26 (MISCELLANEOUS) ×1 IMPLANT
DERMABOND ADVANCED .7 DNX12 (GAUZE/BANDAGES/DRESSINGS) ×1 IMPLANT
DRAPE C-ARM XRAY 36X54 (DRAPES) ×1 IMPLANT
DRAPE SHEET LG 3/4 BI-LAMINATE (DRAPES) ×3 IMPLANT
DRAPE TABLE BACK 80X90 (DRAPES) ×1 IMPLANT
DRSG MEPILEX SACRM 8.7X9.8 (GAUZE/BANDAGES/DRESSINGS) ×1 IMPLANT
DRSG OPSITE POSTOP 4X8 (GAUZE/BANDAGES/DRESSINGS) ×1 IMPLANT
ELECT BLADE 4.0 EZ CLEAN MEGAD (MISCELLANEOUS) ×1
ELECT REM PT RETURN 9FT ADLT (ELECTROSURGICAL) ×1
ELECTRODE BLDE 4.0 EZ CLN MEGD (MISCELLANEOUS) ×1 IMPLANT
ELECTRODE REM PT RTRN 9FT ADLT (ELECTROSURGICAL) ×1 IMPLANT
GLOVE BIO SURGEON STRL SZ8 (GLOVE) ×1 IMPLANT
GLOVE BIOGEL PI IND STRL 8 (GLOVE) ×1 IMPLANT
GLOVE PI ORTHO PRO STRL 7.5 (GLOVE) ×2 IMPLANT
GLOVE PI ORTHO PRO STRL SZ8 (GLOVE) ×2 IMPLANT
GLOVE SURG SYN 7.5 E (GLOVE) ×1 IMPLANT
GLOVE SURG SYN 7.5 PF PI (GLOVE) ×1 IMPLANT
GOWN SRG XL LVL 3 NONREINFORCE (GOWNS) ×1 IMPLANT
GOWN STRL NON-REIN TWL XL LVL3 (GOWNS) ×1
GOWN STRL REUS W/ TWL LRG LVL3 (GOWN DISPOSABLE) ×1 IMPLANT
GOWN STRL REUS W/ TWL XL LVL3 (GOWN DISPOSABLE) ×1 IMPLANT
GOWN STRL REUS W/TWL LRG LVL3 (GOWN DISPOSABLE) ×1
GOWN STRL REUS W/TWL XL LVL3 (GOWN DISPOSABLE) ×1
HANDLE YANKAUER SUCT OPEN TIP (MISCELLANEOUS) ×1 IMPLANT
HEAD CERAMIC FEMORAL 36MM (Head) IMPLANT
HOOD PEEL AWAY T7 (MISCELLANEOUS) ×2 IMPLANT
INSERT 0 DEGREE 36 (Miscellaneous) IMPLANT
IV NS 100ML SINGLE PACK (IV SOLUTION) ×1 IMPLANT
KIT PATIENT CARE HANA TABLE (KITS) ×1 IMPLANT
LIGHT WAVEGUIDE WIDE FLAT (MISCELLANEOUS) ×1 IMPLANT
MANIFOLD NEPTUNE II (INSTRUMENTS) ×1 IMPLANT
MARKER SKIN DUAL TIP RULER LAB (MISCELLANEOUS) ×1 IMPLANT
MAT ABSORB FLUID 56X50 GRAY (MISCELLANEOUS) ×1 IMPLANT
NDL FILTER BLUNT 18X1 1/2 (NEEDLE) IMPLANT
NDL SAFETY ECLIPSE 18X1.5 (NEEDLE) ×1 IMPLANT
NDL SPNL 20GX3.5 QUINCKE YW (NEEDLE) ×1 IMPLANT
NEEDLE FILTER BLUNT 18X1 1/2 (NEEDLE) IMPLANT
NEEDLE SPNL 20GX3.5 QUINCKE YW (NEEDLE) ×1 IMPLANT
NS IRRIG 500ML POUR BTL (IV SOLUTION) ×1 IMPLANT
PACK HIP COMPR (MISCELLANEOUS) ×1 IMPLANT
PAD ARMBOARD 7.5X6 YLW CONV (MISCELLANEOUS) ×1 IMPLANT
SCREW HEX LP 6.5X15 (Screw) IMPLANT
SCREW HEX LP 6.5X30 (Screw) IMPLANT
SHELL TRIDENT II CLUST 50 (Shell) IMPLANT
SLEEVE SCD COMPRESS KNEE MED (STOCKING) ×1 IMPLANT
SOLUTION IRRIG SURGIPHOR (IV SOLUTION) ×1 IMPLANT
STEM STD OFFSET SZ5 36 (Stem) IMPLANT
SURGIFLO W/THROMBIN 8M KIT (HEMOSTASIS) IMPLANT
SUT BONE WAX W31G (SUTURE) ×1 IMPLANT
SUT ETHIBOND 2 V 37 (SUTURE) ×1 IMPLANT
SUT SILK 0 (SUTURE) ×1
SUT SILK 0 30XBRD TIE 6 (SUTURE) ×1 IMPLANT
SUT STRATA 1 CT-1 DLB (SUTURE) ×1
SUT STRATAFIX 14 PDO 48 VLT (SUTURE) ×1 IMPLANT
SUT STRATAFIX PDO 1 14 VIOLET (SUTURE) ×1
SUT VIC AB 0 CT1 36 (SUTURE) ×1 IMPLANT
SUT VIC AB 2-0 CT2 27 (SUTURE) ×2 IMPLANT
SUTURE STRATA SPIR 4-0 18 (SUTURE) ×1 IMPLANT
SYR 30ML LL (SYRINGE) ×2 IMPLANT
SYR TB 1ML LL NO SAFETY (SYRINGE) IMPLANT
TAPE MICROFOAM 4IN (TAPE) IMPLANT
TOWEL OR 17X26 4PK STRL BLUE (TOWEL DISPOSABLE) IMPLANT
TRAP FLUID SMOKE EVACUATOR (MISCELLANEOUS) ×1 IMPLANT
WAND WEREWOLF FASTSEAL 6.0 (MISCELLANEOUS) ×1 IMPLANT
WATER STERILE IRR 1000ML POUR (IV SOLUTION) ×1 IMPLANT

## 2023-11-13 NOTE — Evaluation (Signed)
Physical Therapy Evaluation Patient Details Name: Monica Terry MRN: [id number] DOB: [birth year] Today's Date: 11/13/2023  History of Present Illness  Pt is a 57 yo female s/p R THA. PMH of COPD, SOB, GERD, bipolar, DM, thyroid issues.  Clinical Impression  Patient A&Ox4, reported 8/10 pain in R hip/back. Stated at baseline she is independent, will have family/friends to check in and help her as needed. She was able to perform several exercises with verbal cues, AAROM for heel slides on RLE. Supine to sit modI, extra time. Sit <> stand with RW and CGA but did stand up early prior to RW being in reach. She ambulated ~26ft with RW and CGA, antalgic gait noted. Returned to room and up in recliner with needs in reach.  Overall the patient demonstrated deficits (see "PT Problem List") that impede the patient's functional abilities, safety, and mobility and would benefit from skilled PT intervention.          If plan is discharge home, recommend the following: A little help with bathing/dressing/bathroom;Assistance with cooking/housework;Assist for transportation;Help with stairs or ramp for entrance   Can travel by private vehicle        Equipment Recommendations    Recommendations for Other Services       Functional Status Assessment Patient has had a recent decline in their functional status and demonstrates the ability to make significant improvements in function in a reasonable and predictable amount of time.     Precautions / Restrictions Precautions Precautions: Fall Restrictions Weight Bearing Restrictions: Yes RLE Weight Bearing: Weight bearing as tolerated      Mobility  Bed Mobility Overal bed mobility: Modified Independent                  Transfers Overall transfer level: Needs assistance Equipment used: Rolling walker (2 wheels) Transfers: Sit to/from Stand Sit to Stand: Contact guard assist                Ambulation/Gait Ambulation/Gait  assistance: Contact guard assist Gait Distance (Feet): 50 Feet Assistive device: Rolling walker (2 wheels)         General Gait Details: step to, antalgic gait  Stairs            Wheelchair Mobility     Tilt Bed    Modified Rankin (Stroke Patients Only)       Balance Overall balance assessment: Needs assistance Sitting-balance support: Feet supported Sitting balance-Leahy Scale: Good     Standing balance support: Bilateral upper extremity supported Standing balance-Leahy Scale: Fair                               Pertinent Vitals/Pain Pain Assessment Pain Assessment: 0-10 Pain Score: 8  Pain Location: R hip Pain Descriptors / Indicators: Aching, Grimacing, Guarding, Moaning Pain Intervention(s): Limited activity within patient's tolerance, Monitored during session, Repositioned, Premedicated before session    Home Living Family/patient expects to be discharged to:: Private residence Living Arrangements: Alone Available Help at Discharge: Family Type of Home: House Home Access: Ramped entrance       Home Layout: Multi-level Home Equipment: Agricultural consultant (2 wheels);Cane - single point      Prior Function Prior Level of Function : Independent/Modified Independent;Driving                     Extremity/Trunk Assessment   Upper Extremity Assessment Upper Extremity Assessment: Overall WFL for tasks assessed  Lower Extremity Assessment Lower Extremity Assessment: Overall WFL for tasks assessed       Communication      Cognition Arousal: Alert Behavior During Therapy: WFL for tasks assessed/performed Overall Cognitive Status: Within Functional Limits for tasks assessed                                          General Comments      Exercises Total Joint Exercises Ankle Circles/Pumps: AROM, Both, 10 reps Quad Sets: AROM, Both, 10 reps Heel Slides: AAROM, Strengthening, Right, 10 reps, Left, AROM Hip  ABduction/ADduction: AROM, Strengthening, Both, 10 reps   Assessment/Plan    PT Assessment Patient needs continued PT services  PT Problem List Decreased strength;Pain;Decreased range of motion;Decreased activity tolerance;Decreased balance;Decreased mobility;Decreased knowledge of precautions;Decreased knowledge of use of DME       PT Treatment Interventions DME instruction;Neuromuscular re-education;Gait training;Stair training;Patient/family education;Functional mobility training;Therapeutic activities;Therapeutic exercise;Balance training    PT Goals (Current goals can be found in the Care Plan section)  Acute Rehab PT Goals Patient Stated Goal: to have less pain PT Goal Formulation: With patient Time For Goal Achievement: 11/27/23 Potential to Achieve Goals: Good    Frequency BID     Co-evaluation               AM-PAC PT "6 Clicks" Mobility  Outcome Measure Help needed turning from your back to your side while in a flat bed without using bedrails?: None Help needed moving from lying on your back to sitting on the side of a flat bed without using bedrails?: None Help needed moving to and from a bed to a chair (including a wheelchair)?: None Help needed standing up from a chair using your arms (e.g., wheelchair or bedside chair)?: A Little Help needed to walk in hospital room?: A Little Help needed climbing 3-5 steps with a railing? : A Little 6 Click Score: 21    End of Session Equipment Utilized During Treatment: Gait belt Activity Tolerance: Patient tolerated treatment well Patient left: in chair;with call bell/phone within reach Nurse Communication: Mobility status PT Visit Diagnosis: Other abnormalities of gait and mobility (R26.89);Difficulty in walking, not elsewhere classified (R26.2);Muscle weakness (generalized) (M62.81);Pain Pain - Right/Left: Right Pain - part of body: Hip    Time: 1413-1431 PT Time Calculation (min) (ACUTE ONLY): 18 min   Charges:    PT Evaluation $PT Eval Low Complexity: 1 Low PT Treatments $Therapeutic Activity: 8-22 mins PT General Charges $$ ACUTE PT VISIT: 1 Visit         Olga Coaster PT, DPT 2:53 PM,11/13/23

## 2023-11-13 NOTE — Anesthesia Procedure Notes (Signed)
Procedure Name: MAC Date/Time: 11/13/2023 7:35 AM  Performed by: Lily Lovings, CRNAPre-anesthesia Checklist: Patient identified, Emergency Drugs available, Suction available and Patient being monitored Patient Re-evaluated:Patient Re-evaluated prior to induction Oxygen Delivery Method: Simple face mask Preoxygenation: Pre-oxygenation with 100% oxygen Induction Type: IV induction

## 2023-11-13 NOTE — Anesthesia Preprocedure Evaluation (Signed)
Anesthesia Evaluation  Patient identified by MRN, date of birth, ID band Patient awake    Reviewed: Allergy & Precautions, H&P , NPO status , Patient's Chart, lab work & pertinent test results, reviewed documented beta blocker date and time   History of Anesthesia Complications (+) history of anesthetic complications  Airway Mallampati: II  TM Distance: >3 FB Neck ROM: full    Dental  (+) Teeth Intact   Pulmonary shortness of breath, COPD,  COPD inhaler, former smoker   Pulmonary exam normal        Cardiovascular Exercise Tolerance: Poor Normal cardiovascular exam+ Valvular Problems/Murmurs  Rhythm:regular Rate:Normal     Neuro/Psych  Headaches PSYCHIATRIC DISORDERS Anxiety Depression Bipolar Disorder    Neuromuscular disease    GI/Hepatic ,GERD  Medicated,,(+) Hepatitis -  Endo/Other  diabetes, Well ControlledHypothyroidism Hyperthyroidism   Renal/GU negative Renal ROS  negative genitourinary   Musculoskeletal   Abdominal   Peds  Hematology  (+) Blood dyscrasia, anemia   Anesthesia Other Findings Past Medical History: No date: Abnormal uterine bleeding No date: Anemia No date: Anxiety No date: Arthritis     Comment:  NECK No date: Asplenia after surgical procedure No date: Bell's palsy     Comment:  right side No date: Bipolar 1 disorder (HCC) No date: Brittle bones No date: Carpal tunnel syndrome, bilateral No date: Cervical radiculopathy No date: Complication of anesthesia     Comment:  had to come back to ER after shoulder scope block               breathing diffuculty No date: COPD (chronic obstructive pulmonary disease) (HCC) No date: Depression No date: Diabetes mellitus without complication (HCC) No date: GERD (gastroesophageal reflux disease) No date: Heart murmur 2016: Hepatitis C     Comment:  c: treated by Dr. Servando Snare Hassell Halim) No date: History of kidney stones No date: Hypercholesteremia No  date: Hyperlipidemia No date: Hypothyroidism 01/20/2017: Meningitis     Comment:  due to streptococcus pneumoniae No date: Migraine headache     Comment:  after meningitis.  none since starting nortriptyline No date: Neck pain No date: Neck pain, chronic No date: Nose colonized with MRSA     Comment:  a.) surgical PCR (+) 11/07/2023 prior to RIGHT THA No date: Panic attack No date: Sciatica No date: Shortness of breath dyspnea No date: Thyroid disease No date: Type 2 diabetes mellitus (HCC) 2018: Viral meningitis     Comment:  and bacterial No date: Wears dentures     Comment:  full upper Past Surgical History: No date: ABDOMINAL HYSTERECTOMY 2009: ANTERIOR FUSION CERVICAL SPINE 11/22/2017: CARPAL TUNNEL RELEASE; Right     Comment:  Procedure: CARPAL TUNNEL RELEASE;  Surgeon: Erin Sons, MD;  Location: [facility];  Service: Orthopedics;                Laterality: Right; 09/2017: CARPAL TUNNEL RELEASE; Left 03/27/2018: COLONOSCOPY WITH PROPOFOL; N/A     Comment:  Procedure: COLONOSCOPY WITH PROPOFOL;  Surgeon: Tol[place],               Boykin Nearing, MD;  Location: [facility];  Service:               Gastroenterology;  Laterality: N/A; 03/27/2018: ESOPHAGOGASTRODUODENOSCOPY (EGD) WITH PROPOFOL; N/A     Comment:  Procedure: ESOPHAGOGASTRODUODENOSCOPY (EGD) WITH  PROPOFOL;  Surgeon: Toledo, Boykin Nearing, MD;  Location:               [facility];  Service: Gastroenterology;  Laterality:               N/A; 02/08/2016: HYSTEROSCOPY WITH D & C; N/A     Comment:  Procedure: DILATATION AND CURETTAGE /HYSTEROSCOPY;                Surgeon: Herold Harms, MD;  Location: [facility];                Service: Gynecology;  Laterality: N/A; 11/27/2020: KNEE ARTHROSCOPY WITH MEDIAL MENISECTOMY; Right     Comment:  Procedure: Right arthroscopic partial medial               meniscectomy, chondroplasty, and medial tibial               subchondroplasty;  Surgeon:  Signa Kell, MD;  Location:               [facility];  Service: Orthopedics;  Laterality: Right; 08/07/2023: ROBOTIC ASSISTED LAPAROSCOPIC CHOLECYSTECTOMY 04/11/2016: SALPINGOOPHORECTOMY; Bilateral     Comment:  Procedure: SALPINGO OOPHORECTOMY;  Surgeon: Herold Harms, MD;  Location: [facility];  Service:               Gynecology;  Laterality: Bilateral; 01/04/2019: SHOULDER ARTHROSCOPY; Right     Comment:  Procedure: ARTHROSCOPY SHOULDER WITH ARTHROSCOPIC                DISTAL CLAVICLE EXCISION, SUBACROMIAL AND DECOMPRESSION               BICEPS TENOTOMY;  Surgeon: Signa Kell, MD;  Location:               [facility];  Service: Orthopedics;  Laterality:               Right;  TODD MUNDY ASSISTING ARTHROCARE WAND FLOW 90               WAND BARREL [place] CHAIR WITH SPYDER SLING AFTER               SURGERY Diabetic - injectible 1984: SPLENECTOMY, TOTAL 03/09/2021: TOTAL KNEE ARTHROPLASTY; Left     Comment:  Procedure: TOTAL KNEE ARTHROPLASTY - [place] to               Assist;  Surgeon: Kennedy Bucker, MD;  Location: [facility];              Service: Orthopedics;  Laterality: Left; 12/16/2021: TOTAL KNEE ARTHROPLASTY; Right     Comment:  Procedure: TOTAL KNEE ARTHROPLASTY;  Surgeon: Kennedy Bucker, MD;  Location: [facility];  Service: Orthopedics;               Laterality: Right; No date: TUBAL LIGATION 04/11/2016: VAGINAL HYSTERECTOMY; Bilateral     Comment:  Procedure: HYSTERECTOMY VAGINAL;  Surgeon: Herold Harms, MD;  Location: [facility];  Service:               Gynecology;  Laterality: Bilateral; BMI    Body Mass Index: 27.83 kg/m     Reproductive/Obstetrics negative OB ROS  Anesthesia Physical Anesthesia Plan  ASA: 3  Anesthesia Plan: Spinal   Post-op Pain Management:    Induction:   PONV Risk Score and Plan: 4 or greater  Airway Management  Planned:   Additional Equipment:   Intra-op Plan:   Post-operative Plan:   Informed Consent: I have reviewed the patients History and Physical, chart, labs and discussed the procedure including the risks, benefits and alternatives for the proposed anesthesia with the patient or authorized representative who has indicated his/her understanding and acceptance.     Dental Advisory Given  Plan Discussed with: CRNA  Anesthesia Plan Comments:        Anesthesia Quick Evaluation

## 2023-11-13 NOTE — Transfer of Care (Signed)
Immediate Anesthesia Transfer of Care Note  Patient: Monica Terry  Procedure(s) Performed: TOTAL HIP ARTHROPLASTY ANTERIOR APPROACH (Right: Hip)  Patient Location: PACU  Anesthesia Type:MAC and Spinal  Level of Consciousness: awake and drowsy  Airway & Oxygen Therapy: Patient Spontanous Breathing and Patient connected to face mask oxygen  Post-op Assessment: Report given to RN, Post -op Vital signs reviewed and stable, and Patient moving all extremities X 4  Post vital signs: Reviewed and stable  Last Vitals:  Vitals Value Taken Time  BP 126/75 11/13/23 0936  Temp    Pulse 77 11/13/23 0937  Resp 10 11/13/23 0937  SpO2 100 % 11/13/23 0937  Vitals shown include unfiled device data.  Last Pain:  Vitals:   11/13/23 0622  TempSrc: Temporal  PainSc: 6          Complications: No notable events documented.
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
## 2023-11-13 NOTE — Interval H&P Note (Signed)
Patient history and physical updated. Consent reviewed including risks, benefits, and alternatives to surgery. Patient agrees with above plan to proceed with right anterior total hip arthroplasty.

## 2023-11-13 NOTE — Anesthesia Procedure Notes (Signed)
Spinal  Patient location during procedure: OR Start time: 11/13/2023 7:30 AM End time: 11/13/2023 7:36 AM Reason for block: surgical anesthesia Staffing Performed: other anesthesia staff and resident/CRNA  Other anesthesia staff: Lianne Cure, RN Performed by: Lily Lovings, CRNA Authorized by: Yevette Edwards, MD   Preanesthetic Checklist Completed: patient identified, IV checked, site marked, risks and benefits discussed, surgical consent, monitors and equipment checked, pre-op evaluation and timeout performed Spinal Block Patient position: sitting Prep: ChloraPrep Patient monitoring: heart rate, continuous pulse ox, blood pressure and cardiac monitor Approach: midline Location: L4-5 Injection technique: single-shot Needle Needle type: Whitacre and Introducer  Needle gauge: 24 G Needle length: 9 cm Assessment Events: CSF return Additional Notes Negative paresthesia. Negative blood return. Positive free-flowing CSF. Expiration date of kit checked and confirmed. Patient tolerated procedure well, without complications.
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
## 2023-11-13 NOTE — Op Note (Signed)
Patient Name: Monica Terry  NWG:[id number]  Pre-Operative Diagnosis: Right hip Osteoarthritis  Post-Operative Diagnosis: (same)  Procedure: Right Total Hip Arthroplasty  Components/Implants: Cup: Trident Tritanium clusterhole 60mm/D w/x 2 screws    Liner: Neutral X3 Poly 36/D  Stem: Insignia #5 std offset  Head:biolox ceramic 36mm +0  Date of Surgery: 11/13/2023  Surgeon: Reinaldo Berber MD  Assistant: Amador Cunas PA (present and scrubbed throughout the case, critical for assistance with exposure, retraction, instrumentation, and closure)   Anesthesiologist: Adams  Anesthesia: Spinal   EBL: 150cc  IVF:700cc  Complications: None   Brief history: The patient is a 57 year old female with a history of osteoarthritis of the right hip with pain limiting their range of motion and activities of daily living, which has failed multiple attempts at conservative therapy.  The risks and benefits of total hip arthroplasty as definitive surgical treatment were discussed with the patient, who opted to proceed with the operation.  After outpatient medical clearance and optimization was completed the patient was admitted to [facility] for the procedure.  All preoperative films were reviewed and an appropriate surgical plan was made prior to surgery.   Description of procedure: The patient was brought to the operating room where laterality was confirmed by all those present to be the right side.  The patient was administered spinal anesthesia on a stretcher prior to being moved supine on the operating room table. Patient was given an intravenous dose of antibiotics for surgical prophylaxis and TXA.  All bony prominences and extremities were well padded and the patient was securely attached to the table boots, a perineal post was placed and the patient had a safety strap placed.  Surgical site was prepped with alcohol and chlorhexidine. The surgical site over the hip was and  draped in typical sterile fashion with multiple layers of adhesive and nonadhesive drapes.  The incision site was marked out with a sterile marker and care was taken to assess the position of the ASIS and ensure appropriate position for the incision.    A surgical timeout was then called with participation of all staff in the room the patient was then a confirmed again and laterality confirmed.  Incision was made over the anterior lateral aspect of the proximal thigh in line with the TFL.  Appropriate retractors were placed and all bleeding vessels were coagulated within the subcutaneous and fatty layers.  An incision was made in the TFL fascia in the interval was carefully identified.  The lateral ascending branches of the circumflex vessels were identified, cauterized and carefully dissected. The main vessels were then tied with a 0 silk hand tie.  Retractors were placed around the superior lateral and inferior medial aspects of the femoral neck and a capsulotomy was performed exposing the hip joint.  Retraction stitches were placed and the capsulotomy to assist with visualization.  Femoral neck cut was then made and the femoral head was extracted after placing the leg in traction.  Bone wax was then applied to the proximal cut surface of the femur and aqua mantis was used to address any bleeding around the femoral neck cut.  Retractors were then placed around the acetabulum to fully visualize the joint space, and the remaining labral tissue was removed and pulvinar was removed.   The acetabulum was then sequentially reamed up to the appropriate size in order to get good fit and fill for the acetabular component while under fluoroscopic guidance.  Acetabular component was then placed and malleted into  a secure fit while confirming position and abduction angle and anteversion utilizing fluoroscopy.  2 screws were then placed in the acetabular cup to assist in securing the cup in place. The cup was irrigated,   a real neutral liner was placed, impacted, and checked for stability. The femur traction was dropped and sequentially externally rotated while performing a release of the posterior and superomedial tissues off of the proximal femur to allow for mobility, care was taken to preserve the external rotators and piriformis attachments.  The remaining interval between the abductors and the capsule was dissected out and a retractor was placed over the superolateral aspect of the femur over the greater trochanter.  The leg was carefully brought down into extension and adducted to provide visualization of the proximal femur for broaching.  The femur was then sequentially broached up to an appropriate size which provided for good fill and stability to the femoral broach.  A trial neck and head were placed on the femoral broach and the leg was brought up for reduction.  The hip was reduced and manual check of stability was performed.  The hip was found to be stable in flexion internal rotation and extension external rotation.  Leg lengths were confirmed on fluoroscopy.   The hip was then dislocated the trial neck and head were removed.  The leg was then brought down into extension and adduction in the proximal femur was reexposed.  The broach trial was removed and the femur was irrigated with normal saline prior to the real femoral stem being implanted.  After the femoral stem was seated and shown to have good fit and fill the appropriate head was impacted the leg was brought up and reduced.  There was good range of motion with stability in flexion internal rotation and extension external rotation on testing.  Leg lengths were found to be appropriate on fluoroscopic evaluation at this time.  The hip was then irrigated with betdine based surgiphor solution and then saline solution.  The capsulotomy was repaired with Ethibond sutures.  A pericapsular and peritrochanteric cocktail with Exparel and bupivacaine was then injected  as well as the subcutaneous tissues. The fascia was closed with a #1 barbed running suture.  The deep tissues were closed with Vicryl sutures the subcutaneous tissues were closed with interrupted Vicryl sutures and a running barbed 4-0 suture.  The skin was then reinforced with Dermabond and a sterile dressing was placed.   The patient was awoken from anesthesia transferred off of the operating room table onto a hospital bed where examination of leg lengths found the leg lengths to be equal with a good distal pulse.  The patient was then transferred to the PACU in stable condition.

## 2023-11-14 DIAGNOSIS — M1611 Unilateral primary osteoarthritis, right hip: Secondary | ICD-10-CM | POA: Diagnosis not present

## 2023-11-14 LAB — BASIC METABOLIC PANEL
Anion gap: 8 (ref 5–15)
BUN: 20 mg/dL (ref 6–20)
CO2: 25 mmol/L (ref 22–32)
Calcium: 8.7 mg/dL — ABNORMAL LOW (ref 8.9–10.3)
Chloride: 105 mmol/L (ref 98–111)
Creatinine, Ser: 0.54 mg/dL (ref 0.44–1.00)
GFR, Estimated: >60 mL/min (ref >60)
Glucose, Bld: 147 mg/dL — ABNORMAL HIGH (ref 70–99)
Potassium: 4.2 mmol/L (ref 3.5–5.1)
Sodium: 138 mmol/L (ref 135–145)

## 2023-11-14 LAB — CBC
HCT: 34.3 % — ABNORMAL LOW (ref 36.0–46.0)
Hemoglobin: 11.1 g/dL — ABNORMAL LOW (ref 12.0–15.0)
MCH: 29.8 pg (ref 26.0–34.0)
MCHC: 32.4 g/dL (ref 30.0–36.0)
MCV: 92.2 fL (ref 80.0–100.0)
Platelets: 338 10*3/uL (ref 150–400)
RBC: 3.72 MIL/uL — ABNORMAL LOW (ref 3.87–5.11)
RDW: 13.7 % (ref 11.5–15.5)
WBC: 17.8 10*3/uL — ABNORMAL HIGH (ref 4.0–10.5)
nRBC: 0 % (ref 0.0–0.2)

## 2023-11-14 MED ORDER — KETOROLAC TROMETHAMINE 15 MG/ML IJ SOLN
INTRAMUSCULAR | Status: AC
  Filled 2023-11-14: qty 1

## 2023-11-14 MED ORDER — ENOXAPARIN SODIUM 40 MG/0.4ML IJ SOSY: 40.0000 mg | PREFILLED_SYRINGE | INTRAMUSCULAR | 0 refills | Status: AC

## 2023-11-14 MED ORDER — CELECOXIB 200 MG PO CAPS: 200.0000 mg | ORAL_CAPSULE | Freq: Two times a day (BID) | ORAL | 0 refills | Status: AC

## 2023-11-14 MED ORDER — OXYCODONE HCL 5 MG PO TABS
ORAL_TABLET | ORAL | Status: AC
  Filled 2023-11-14: qty 3

## 2023-11-14 MED ORDER — OXYCODONE HCL 5 MG PO TABS: 5.0000 mg | ORAL_TABLET | Freq: Four times a day (QID) | ORAL | 0 refills | Status: AC | PRN

## 2023-11-14 MED ORDER — ONDANSETRON HCL 4 MG PO TABS: 4.0000 mg | ORAL_TABLET | Freq: Four times a day (QID) | ORAL | 0 refills | Status: AC | PRN

## 2023-11-14 MED ORDER — PANTOPRAZOLE SODIUM 40 MG PO TBEC
DELAYED_RELEASE_TABLET | ORAL | Status: AC
Start: 2023-11-14 — End: ?
  Filled 2023-11-14: qty 1

## 2023-11-14 MED ORDER — ACETAMINOPHEN 500 MG PO TABS: 1000.0000 mg | ORAL_TABLET | Freq: Four times a day (QID) | ORAL | 0 refills | Status: AC

## 2023-11-14 MED ORDER — ENOXAPARIN SODIUM 40 MG/0.4ML IJ SOSY
PREFILLED_SYRINGE | INTRAMUSCULAR | Status: AC
  Filled 2023-11-14: qty 0.4

## 2023-11-14 MED ORDER — PREGABALIN 50 MG PO CAPS
ORAL_CAPSULE | ORAL | Status: AC
  Filled 2023-11-14: qty 1

## 2023-11-14 MED ORDER — DOCUSATE SODIUM 100 MG PO CAPS
ORAL_CAPSULE | ORAL | Status: AC
  Filled 2023-11-14: qty 1

## 2023-11-14 NOTE — Progress Notes (Signed)
Physical Therapy Treatment Patient Details Name: Monica Terry MRN: [id number] DOB: June 04, 1966 Today's Date: 11/14/2023   History of Present Illness Pt is a 57 yo female s/p R THA. PMH of COPD, SOB, GERD, bipolar, DM, thyroid issues.    PT Comments  OOB and completes gait to stairs, bathroom and back to bed for breakfast.  Education provided throughout session in regards to safety, HEP and general expectations upon discharge.  Expect DC home today.   If plan is discharge home, recommend the following: A little help with bathing/dressing/bathroom;Assistance with cooking/housework;Assist for transportation;Help with stairs or ramp for entrance   Can travel by private vehicle        Equipment Recommendations       Recommendations for Other Services       Precautions / Restrictions Precautions Precautions: Fall Restrictions Weight Bearing Restrictions: Yes RLE Weight Bearing: Weight bearing as tolerated     Mobility  Bed Mobility Overal bed mobility: Modified Independent                  Transfers Overall transfer level: Needs assistance Equipment used: Rolling walker (2 wheels) Transfers: Sit to/from Stand Sit to Stand: Contact guard assist                Ambulation/Gait Ambulation/Gait assistance: Contact guard assist Gait Distance (Feet): 140 Feet Assistive device: Rolling walker (2 wheels) Gait Pattern/deviations: Step-through pattern Gait velocity: WFL         Stairs Stairs: Yes Stairs assistance: Contact guard assist Stair Management: Two rails, Step to pattern, Forwards Number of Stairs: 4 General stair comments: no stairs at home   Wheelchair Mobility     Tilt Bed    Modified Rankin (Stroke Patients Only)       Balance Overall balance assessment: Needs assistance Sitting-balance support: Feet supported Sitting balance-Leahy Scale: Normal     Standing balance support: Bilateral upper extremity supported Standing  balance-Leahy Scale: Good                              Cognition Arousal: Alert Behavior During Therapy: WFL for tasks assessed/performed, Impulsive                                            Exercises Other Exercises Other Exercises: standing AROM but pt does self direct ex    General Comments        Pertinent Vitals/Pain Pain Assessment Pain Assessment: Faces Faces Pain Scale: Hurts even more Pain Location: R hip Pain Descriptors / Indicators: Aching, Grimacing, Guarding, Moaning Pain Intervention(s): Premedicated before session, Repositioned, Monitored during session    Home Living                          Prior Function            PT Goals (current goals can now be found in the care plan section) Progress towards PT goals: Progressing toward goals    Frequency    BID      PT Plan      Co-evaluation              AM-PAC PT "6 Clicks" Mobility   Outcome Measure  Help needed turning from your back to your side while in a flat bed without using bedrails?: None  Help needed moving from lying on your back to sitting on the side of a flat bed without using bedrails?: None Help needed moving to and from a bed to a chair (including a wheelchair)?: None Help needed standing up from a chair using your arms (e.g., wheelchair or bedside chair)?: None Help needed to walk in hospital room?: A Little Help needed climbing 3-5 steps with a railing? : A Little 6 Click Score: 22    End of Session Equipment Utilized During Treatment: Gait belt Activity Tolerance: Patient tolerated treatment well Patient left: in bed;with call bell/phone within reach Nurse Communication: Mobility status PT Visit Diagnosis: Other abnormalities of gait and mobility (R26.89);Difficulty in walking, not elsewhere classified (R26.2);Muscle weakness (generalized) (M62.81);Pain Pain - Right/Left: Right Pain - part of body: Hip     Time:  1610-9604 PT Time Calculation (min) (ACUTE ONLY): 12 min  Charges:    $Gait Training: 8-22 mins PT General Charges $$ ACUTE PT VISIT: 1 Visit                   Danielle Dess, PTA 11/14/23, 9:30 AM

## 2023-11-14 NOTE — Progress Notes (Signed)
DISCHARGE NOTE:  Pt given discharge instructions and verbalized understanding. TED hose on both legs. Pt wheeled to car by staff, family providing transportation.
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
## 2023-11-14 NOTE — Plan of Care (Signed)
  Problem: Activity: Goal: Ability to avoid complications of mobility impairment will improve Outcome: Progressing   Problem: Pain Management: Goal: Pain level will decrease with appropriate interventions Outcome: Progressing   Problem: Skin Integrity: Goal: Will show signs of wound healing Outcome: Progressing   

## 2023-11-14 NOTE — Anesthesia Postprocedure Evaluation (Signed)
Anesthesia Post Note  Patient: Monica Terry  Procedure(s) Performed: TOTAL HIP ARTHROPLASTY ANTERIOR APPROACH (Right: Hip)  Patient location during evaluation: Nursing Unit Anesthesia Type: Spinal Level of consciousness: awake Respiratory status: spontaneous breathing Cardiovascular status: stable Postop Assessment: no headache Anesthetic complications: no   There were no known notable events for this encounter.   Last Vitals:  Vitals:   11/14/23 0100 11/14/23 0537  BP: (!) 103/58 131/79  Pulse: 82 87  Resp: 16 18  Temp: 36.7 C 36.9 C  SpO2: 97% 97%    Last Pain:  Vitals:   11/14/23 0537  TempSrc: Temporal  PainSc: 8                  Jaye Beagle

## 2023-11-14 NOTE — Progress Notes (Signed)
Subjective: 1 Day Post-Op Procedure(s) (LRB): TOTAL HIP ARTHROPLASTY ANTERIOR APPROACH (Right) Patient reports pain as mild.   Patient is well, and has had no acute complaints or problems Denies any CP, SOB, ABD pain. We will continue therapy today.  Plan is to go Home after hospital stay.  Objective: Vital signs in last 24 hours: Temp:  [97 F (36.1 C)-98.4 F (36.9 C)] 98.4 F (36.9 C) (11/19 0537) Pulse Rate:  [66-90] 87 (11/19 0537) Resp:  [7-25] 18 (11/19 0537) BP: (94-137)/(57-81) 131/79 (11/19 0537) SpO2:  [94 %-100 %] 97 % (11/19 0537)  Intake/Output from previous day: 11/18 0701 - 11/19 0700 In: 1921.4 [I.V.:1196.4; IV Piggyback:725] Out: 1050 [Urine:1000; Blood:50] Intake/Output this shift: No intake/output data recorded.  Recent Labs    11/14/23 0619  HGB 11.1*   Recent Labs    11/14/23 0619  WBC 17.8*  RBC 3.72*  HCT 34.3*  PLT 338   Recent Labs    11/14/23 0619  NA 138  K 4.2  CL 105  CO2 25  BUN 20  CREATININE 0.54  GLUCOSE 147*  CALCIUM 8.7*   No results for input(s): "LABPT", "INR" in the last 72 hours.  EXAM General - Patient is Alert, Appropriate, and Oriented Extremity - Neurovascular intact Sensation intact distally Intact pulses distally Dorsiflexion/Plantar flexion intact No cellulitis present Compartment soft Dressing - dressing C/D/I and no drainage Motor Function - intact, moving foot and toes well on exam.   Past Medical History:  Diagnosis Date   Abnormal uterine bleeding    Anemia    Anxiety    Arthritis    NECK   Asplenia after surgical procedure    Bell's palsy    right side   Bipolar 1 disorder (HCC)    Brittle bones    Carpal tunnel syndrome, bilateral    Cervical radiculopathy    Complication of anesthesia    had to come back to ER after shoulder scope block breathing diffuculty   COPD (chronic obstructive pulmonary disease) (HCC)    Depression    Diabetes mellitus without complication (HCC)     GERD (gastroesophageal reflux disease)    Heart murmur    Hepatitis C 2016   c: treated by Dr. Servando Snare Hassell Halim)   History of kidney stones    Hypercholesteremia    Hyperlipidemia    Hypothyroidism    Meningitis 01/20/2017   due to streptococcus pneumoniae   Migraine headache    after meningitis.  none since starting nortriptyline   Neck pain    Neck pain, chronic    Nose colonized with MRSA    a.) surgical PCR (+) 11/07/2023 prior to RIGHT THA   Panic attack    Sciatica    Shortness of breath dyspnea    Thyroid disease    Type 2 diabetes mellitus (HCC)    Viral meningitis 2018   and bacterial   Wears dentures    full upper    Assessment/Plan:   1 Day Post-Op Procedure(s) (LRB): TOTAL HIP ARTHROPLASTY ANTERIOR APPROACH (Right) Principal Problem:   S/P total right hip arthroplasty  Estimated body mass index is 27.83 kg/m as calculated from the following:   Height as of this encounter: 5\' 8"  (1.727 m).   Weight as of this encounter: 83 kg. Advance diet Up with therapy Pain well-controlled Labs are stable Vital signs are stable Care management to assist with discharge to home with home health PT today pending safe completion of PT goals.  DVT  Prophylaxis - Lovenox, TED hose, and SCDs Weight-Bearing as tolerated to right leg   T. Cranston Neighbor, PA-C [facility] 11/14/2023, 7:54 AM

## 2023-11-14 NOTE — Discharge Instructions (Signed)
Instructions after Anterior Total Hip Replacement        Dr. Serita Butcher., M.D.      Dept. of [facility]  [place], [place]  [address]  Phone: [phone]   Fax: [phone]    DIET: Drink plenty of non-alcoholic fluids. Resume your normal diet. Include foods high in fiber.  ACTIVITY:  You may use crutches or a walker with weight-bearing as tolerated, unless instructed otherwise. You may be weaned off of the walker or crutches by your Physical Therapist.  Continue doing gentle exercises. Exercising will reduce the pain and swelling, increase motion, and prevent muscle weakness.   Please continue to use the TED compression stockings for 2 weeks. You may remove the stockings at night, but should reapply them in the morning. Do not drive or operate any equipment until instructed.  WOUND CARE:  Continue to use ice packs periodically to reduce pain and swelling. You may shower with honeycomb dressing 3 days after your surgery. Do not submerge incision site under water. Remove honeycomb dressing 7 days after surgery and allow dermabond to fall off on its own.   MEDICATIONS: You may resume your regular medications. Please take the pain medication as prescribed on the medication list. Do not take pain medication on an empty stomach. You have been given a prescription for a blood thinner to prevent blood clots. Please take the medication as instructed. (NOTE: After completing a 2 week course of Lovenox, take one Enteric-coated 81 mg aspirin twice a day for 3 additional weeks.) Pain medications and iron supplements can cause constipation. Use a stool softener (Senokot or Colace) on a daily basis and a laxative (dulcolax or miralax) as needed. Do not drive or drink alcoholic beverages when taking pain medications.  POSTOPERATIVE CONSTIPATION PROTOCOL Constipation - defined medically as fewer than three stools per week and  severe constipation as less than one stool per week.  One of the most common issues patients have following surgery is constipation.  Even if you have a regular bowel pattern at home, your normal regimen is likely to be disrupted due to multiple reasons following surgery.  Combination of anesthesia, postoperative narcotics, change in appetite and fluid intake all can affect your bowels.  In order to avoid complications following surgery, here are some recommendations in order to help you during your recovery period.  Colace (docusate) - Pick up an over-the-counter form of Colace or another stool softener and take twice a day as long as you are requiring postoperative pain medications.  Take with a full glass of water daily.  If you experience loose stools or diarrhea, hold the colace until you stool forms back up.  If your symptoms do not get better within 1 week or if they get worse, check with your doctor.  Dulcolax (bisacodyl) - Pick up over-the-counter and take as directed by the product packaging as needed to assist with the movement of your bowels.  Take with a full glass of water.  Use this product as needed if not relieved by Colace only.   MiraLax (polyethylene glycol) - Pick up over-the-counter to have on hand.  MiraLax is a solution that will increase the amount of water in your bowels to assist with bowel movements.  Take as directed and can mix with a glass of water, juice, soda, coffee, or tea.  Take if you go more than two days without a movement. Do not use MiraLax more than  once per day. Call your doctor if you are still constipated or irregular after using this medication for 7 days in a row.  If you continue to have problems with postoperative constipation, please contact the office for further assistance and recommendations.  If you experience "the worst abdominal pain ever" or develop nausea or vomiting, please contact the office immediatly for further recommendations for  treatment.   CALL THE OFFICE FOR: Temperature above 101 degrees Excessive bleeding or drainage on the dressing. Excessive swelling, coldness, or paleness of the toes. Persistent nausea and vomiting.  FOLLOW-UP:  You should have an appointment to return to the office in 2 weeks after surgery. Arrangements have been made for continuation of Physical Therapy (either home therapy or outpatient therapy).

## 2023-11-29 ENCOUNTER — Ambulatory Visit: Admission: RE | Admit: 2023-11-29 | Payer: Medicaid Other | Source: Home / Self Care | Admitting: Internal Medicine

## 2023-11-29 SURGERY — COLONOSCOPY WITH PROPOFOL
Anesthesia: General

## 2023-12-08 ENCOUNTER — Telehealth: Payer: Medicaid Other | Admitting: Physician Assistant

## 2023-12-08 ENCOUNTER — Ambulatory Visit: Payer: Self-pay

## 2023-12-08 DIAGNOSIS — U071 COVID-19: Secondary | ICD-10-CM

## 2023-12-08 MED ORDER — NIRMATRELVIR/RITONAVIR (PAXLOVID)TABLET: 3.0000 | ORAL_TABLET | Freq: Two times a day (BID) | ORAL | 0 refills | Status: AC

## 2023-12-08 NOTE — Patient Instructions (Signed)
Monica Terry, thank you for joining Monica Loveless, PA-C for today's virtual visit.  While this provider is not your primary care provider (PCP), if your PCP is located in our provider database this encounter information will be shared with them immediately following your visit.   A [facility] MyChart account gives you access to today's visit and all your visits, tests, and labs performed at [facility] " click here if you don't have a [facility] MyChart account or go to [email or web address]  Consent: (Patient) Monica Terry provided verbal consent for this virtual visit at the beginning of the encounter.  Current Medications:  Current Outpatient Medications:    nirmatrelvir/ritonavir (PAXLOVID) 20 x 150 MG & 10 x 100MG  TABS, Take 3 tablets by mouth 2 (two) times daily for 5 days. (Take nirmatrelvir 150 mg two tablets twice daily for 5 days and ritonavir 100 mg one tablet twice daily for 5 days) Patient GFR is greater than 60, Disp: 30 tablet, Rfl: 0   acetaminophen (TYLENOL) 500 MG tablet, Take 2 tablets (1,000 mg total) by mouth every 6 (six) hours., Disp: 30 tablet, Rfl: 0   albuterol (PROVENTIL) (2.5 MG/3ML) 0.083% nebulizer solution, Take 2.5 mg by nebulization every 6 (six) hours as needed for wheezing or shortness of breath., Disp: , Rfl:    albuterol (VENTOLIN HFA) 108 (90 Base) MCG/ACT inhaler, Inhale 2 puffs into the lungs every 6 (six) hours as needed for wheezing or shortness of breath., Disp: , Rfl:    buPROPion (WELLBUTRIN XL) 150 MG 24 hr tablet, Take 150 mg daily by mouth., Disp: , Rfl:    cetirizine (ZYRTEC) 10 MG tablet, Take 10 mg by mouth daily. , Disp: , Rfl:    chlorhexidine (HIBICLENS) 4 % external liquid, Apply 15 mLs (1 Application total) topically as directed for 30 doses. Use as directed daily for 5 days every other week for 6 weeks., Disp: 946 mL, Rfl: 1   docusate sodium (COLACE) 100 MG capsule, Take 100 mg by mouth 2 (two) times daily.,  Disp: , Rfl:    enoxaparin (LOVENOX) 40 MG/0.4ML injection, Inject 0.4 mLs (40 mg total) into the skin daily for 14 days., Disp: 5.6 mL, Rfl: 0   ergocalciferol (VITAMIN D2) 1.25 MG (50000 UT) capsule, Take 50,000 Units by mouth every Tuesday., Disp: , Rfl:    estradiol (ESTRACE) 1 MG tablet, Take 1 tablet (1 mg total) by mouth daily., Disp: 30 tablet, Rfl: 12   fluticasone (FLONASE) 50 MCG/ACT nasal spray, Place 2 sprays into both nostrils daily as needed for allergies or rhinitis., Disp: , Rfl:    Fluticasone-Salmeterol (ADVAIR) 250-50 MCG/DOSE AEPB, Inhale 1 puff into the lungs 2 (two) times daily., Disp: , Rfl:    levothyroxine (SYNTHROID) 125 MCG tablet, Take 125 mcg by mouth daily before breakfast. Take 125 mcg on Sat and Sunday, Disp: , Rfl:    lubiprostone (AMITIZA) 24 MCG capsule, Take 24 mcg by mouth 2 (two) times daily with a meal., Disp: , Rfl:    lurasidone (LATUDA) 80 MG TABS tablet, Take 80 mg by mouth daily with breakfast., Disp: , Rfl:    metFORMIN (GLUCOPHAGE) 500 MG tablet, Take 500 mg by mouth 2 (two) times daily with a meal., Disp: , Rfl:    montelukast (SINGULAIR) 10 MG tablet, Take 10 mg by mouth daily., Disp: , Rfl:    mupirocin ointment (BACTROBAN) 2 %, Apply small amount to the inside of both nostrils TWICE a day for the next 5 days.,  Disp: 15 g, Rfl: 0   mupirocin ointment (BACTROBAN) 2 %, Place 1 Application into the nose 2 (two) times daily for 60 doses. Use as directed 2 times daily for 5 days every other week for 6 weeks., Disp: 60 g, Rfl: 0   nortriptyline (PAMELOR) 10 MG capsule, Take 10 mg by mouth at bedtime., Disp: , Rfl:    ondansetron (ZOFRAN) 4 MG tablet, Take 1 tablet (4 mg total) by mouth daily as needed for nausea or vomiting., Disp: 20 tablet, Rfl: 0   ondansetron (ZOFRAN) 4 MG tablet, Take 1 tablet (4 mg total) by mouth every 6 (six) hours as needed for nausea., Disp: 20 tablet, Rfl: 0   oxyCODONE (ROXICODONE) 5 MG immediate release tablet, Take 1 tablet (5  mg total) by mouth every 6 (six) hours as needed for breakthrough pain., Disp: 30 tablet, Rfl: 0   Oxycodone HCl 10 MG TABS, Take 10 mg by mouth 4 (four) times daily as needed (pain)., Disp: , Rfl:    OXYGEN, Inhale 2 L into the lungs at bedtime., Disp: , Rfl:    predniSONE (DELTASONE) 5 MG tablet, Take 5 mg by mouth daily as needed., Disp: , Rfl:    pregabalin (LYRICA) 150 MG capsule, Take 150 mg by mouth in the morning, at noon, and at bedtime. , Disp: , Rfl:    rizatriptan (MAXALT) 5 MG tablet, Take 5 mg by mouth as needed for migraine. May repeat in 2 hours if needed, Disp: , Rfl:    Semaglutide, 2 MG/DOSE, (OZEMPIC, 2 MG/DOSE,) 8 MG/3ML SOPN, Inject 2 mg into the skin every Tuesday., Disp: , Rfl:    simvastatin (ZOCOR) 40 MG tablet, Take 40 mg by mouth at bedtime., Disp: , Rfl:    tiotropium (SPIRIVA HANDIHALER) 18 MCG inhalation capsule, Place 18 mcg into inhaler and inhale daily in the afternoon., Disp: , Rfl:    tiZANidine (ZANAFLEX) 4 MG tablet, Take 4 mg by mouth every 6 (six) hours as needed for muscle spasms., Disp: , Rfl:    valACYclovir (VALTREX) 1000 MG tablet, Take 1,000 mg by mouth 2 (two) times daily as needed (for 2 days at start of cold sore)., Disp: , Rfl:    Varenicline Tartrate (CHANTIX STARTING MONTH PAK PO), Take 1 tablet by mouth 2 (two) times daily., Disp: , Rfl:    Medications ordered in this encounter:  Meds ordered this encounter  Medications   nirmatrelvir/ritonavir (PAXLOVID) 20 x 150 MG & 10 x 100MG  TABS    Sig: Take 3 tablets by mouth 2 (two) times daily for 5 days. (Take nirmatrelvir 150 mg two tablets twice daily for 5 days and ritonavir 100 mg one tablet twice daily for 5 days) Patient GFR is greater than 60    Dispense:  30 tablet    Refill:  0    Supervising Provider:   Merrilee Jansky [phone]     *If you need refills on other medications prior to your next appointment, please contact your pharmacy*  Follow-Up: Call back or seek an in-person  evaluation if the symptoms worsen or if the condition fails to improve as anticipated.  [facility] [phone]  Care Instructions: Can take to lessen severity: Vit C 500mg  twice daily Quercertin 250-500mg  twice daily Zinc 75-100mg  daily Melatonin 3-6 mg at bedtime Vit D3 1000-2000 IU daily Aspirin 81 mg daily with food Optional: Famotidine 20mg  daily Also can add tylenol/ibuprofen as needed for fevers and body aches May add Mucinex  or Mucinex DM as needed for cough/congestion    Isolation Instructions: You are to isolate at home until you have been fever free for at least 24 hours without a fever-reducing medication, and symptoms have been steadily improving for 24 hours. At that time,  you can end isolation but need to mask for an additional 5 days.   If you must be around other household members who do not have symptoms, you need to make sure that both you and the family members are masking consistently with a high-quality mask.  If you note any worsening of symptoms despite treatment, please seek an in-person evaluation ASAP. If you note any significant shortness of breath or any chest pain, please seek ER evaluation. Please do not delay care!   COVID-19: What to Do if You Are Sick If you test positive and are an older adult or someone who is at high risk of getting very sick from COVID-19, treatment may be available. Contact a healthcare provider right away after a positive test to determine if you are eligible, even if your symptoms are mild right now. You can also visit a Test to Treat location and, if eligible, receive a prescription from a provider. Don't delay: Treatment must be started within the first few days to be effective. If you have a fever, cough, or other symptoms, you might have COVID-19. Most people have mild illness and are able to recover at home. If you are sick: Keep track of your symptoms. If you have an emergency warning sign (including trouble  breathing), call 911. Steps to help prevent the spread of COVID-19 if you are sick If you are sick with COVID-19 or think you might have COVID-19, follow the steps below to care for yourself and to help protect other people in your home and community. Stay home except to get medical care Stay home. Most people with COVID-19 have mild illness and can recover at home without medical care. Do not leave your home, except to get medical care. Do not visit public areas and do not go to places where you are unable to wear a mask. Take care of yourself. Get rest and stay hydrated. Take over-the-counter medicines, such as acetaminophen, to help you feel better. Stay in touch with your doctor. Call before you get medical care. Be sure to get care if you have trouble breathing, or have any other emergency warning signs, or if you think it is an emergency. Avoid public transportation, ride-sharing, or taxis if possible. Get tested If you have symptoms of COVID-19, get tested. While waiting for test results, stay away from others, including staying apart from those living in your household. Get tested as soon as possible after your symptoms start. Treatments may be available for people with COVID-19 who are at risk for becoming very sick. Don't delay: Treatment must be started early to be effective--some treatments must begin within 5 days of your first symptoms. Contact your healthcare provider right away if your test result is positive to determine if you are eligible. Self-tests are one of several options for testing for the virus that causes COVID-19 and may be more convenient than laboratory-based tests and point-of-care tests. Ask your healthcare provider or your local health department if you need help interpreting your test results. You can visit your state, tribal, local, and territorial health department's website to look for the latest local information on testing sites. Separate yourself from other  people As much as possible, stay in a specific  room and away from other people and pets in your home. If possible, you should use a separate bathroom. If you need to be around other people or animals in or outside of the home, wear a well-fitting mask. Tell your close contacts that they may have been exposed to COVID-19. An infected person can spread COVID-19 starting 48 hours (or 2 days) before the person has any symptoms or tests positive. By letting your close contacts know they may have been exposed to COVID-19, you are helping to protect everyone. See COVID-19 and Animals if you have questions about pets. If you are diagnosed with COVID-19, someone from the health department may call you. Answer the call to slow the spread. Monitor your symptoms Symptoms of COVID-19 include fever, cough, or other symptoms. Follow care instructions from your healthcare provider and local health department. Your local health authorities may give instructions on checking your symptoms and reporting information. When to seek emergency medical attention Look for emergency warning signs* for COVID-19. If someone is showing any of these signs, seek emergency medical care immediately: Trouble breathing Persistent pain or pressure in the chest New confusion Inability to wake or stay awake Pale, gray, or blue-colored skin, lips, or nail beds, depending on skin tone *This list is not all possible symptoms. Please call your medical provider for any other symptoms that are severe or concerning to you. Call 911 or call ahead to your local emergency facility: Notify the operator that you are seeking care for someone who has or may have COVID-19. Call ahead before visiting your doctor Call ahead. Many medical visits for routine care are being postponed or done by phone or telemedicine. If you have a medical appointment that cannot be postponed, call your doctor's office, and tell them you have or may have COVID-19. This will  help the office protect themselves and other patients. If you are sick, wear a well-fitting mask You should wear a mask if you must be around other people or animals, including pets (even at home). Wear a mask with the best fit, protection, and comfort for you. You don't need to wear the mask if you are alone. If you can't put on a mask (because of trouble breathing, for example), cover your coughs and sneezes in some other way. Try to stay at least 6 feet away from other people. This will help protect the people around you. Masks should not be placed on young children under age 62 years, anyone who has trouble breathing, or anyone who is not able to remove the mask without help. Cover your coughs and sneezes Cover your mouth and nose with a tissue when you cough or sneeze. Throw away used tissues in a lined trash can. Immediately wash your hands with soap and water for at least 20 seconds. If soap and water are not available, clean your hands with an alcohol-based hand sanitizer that contains at least 60% alcohol. Clean your hands often Wash your hands often with soap and water for at least 20 seconds. This is especially important after blowing your nose, coughing, or sneezing; going to the bathroom; and before eating or preparing food. Use hand sanitizer if soap and water are not available. Use an alcohol-based hand sanitizer with at least 60% alcohol, covering all surfaces of your hands and rubbing them together until they feel dry. Soap and water are the best option, especially if hands are visibly dirty. Avoid touching your eyes, nose, and mouth with unwashed hands. Handwashing Tips Avoid  sharing personal household items Do not share dishes, drinking glasses, cups, eating utensils, towels, or bedding with other people in your home. Wash these items thoroughly after using them with soap and water or put in the dishwasher. Clean surfaces in your home regularly Clean and disinfect high-touch  surfaces (for example, doorknobs, tables, handles, light switches, and countertops) in your "sick room" and bathroom. In shared spaces, you should clean and disinfect surfaces and items after each use by the person who is ill. If you are sick and cannot clean, a caregiver or other person should only clean and disinfect the area around you (such as your bedroom and bathroom) on an as needed basis. Your caregiver/other person should wait as long as possible (at least several hours) and wear a mask before entering, cleaning, and disinfecting shared spaces that you use. Clean and disinfect areas that may have blood, stool, or body fluids on them. Use household cleaners and disinfectants. Clean visible dirty surfaces with household cleaners containing soap or detergent. Then, use a household disinfectant. Use a product from [employer] List N: Disinfectants for Coronavirus (COVID-19). Be sure to follow the instructions on the label to ensure safe and effective use of the product. Many products recommend keeping the surface wet with a disinfectant for a certain period of time (look at "contact time" on the product label). You may also need to wear personal protective equipment, such as gloves, depending on the directions on the product label. Immediately after disinfecting, wash your hands with soap and water for 20 seconds. For completed guidance on cleaning and disinfecting your home, visit Complete Disinfection Guidance. Take steps to improve ventilation at home Improve ventilation (air flow) at home to help prevent from spreading COVID-19 to other people in your household. Clear out COVID-19 virus particles in the air by opening windows, using air filters, and turning on fans in your home. Use this interactive tool to learn how to improve air flow in your home. When you can be around others after being sick with COVID-19 Deciding when you can be around others is different for different situations. Find out  when you can safely end home isolation. For any additional questions about your care, contact your healthcare provider or state or local health department. 03/16/2021 Content source: [facility] Immunization [facility] ([facility]), Division of Viral Diseases This information is not intended to replace advice given to you by your health care provider. Make sure you discuss any questions you have with your health care provider. Document Revised: 04/29/2021 Document Reviewed: 04/29/2021 Elsevier Patient Education  2022 [employer].     If you have been instructed to have an in-person evaluation today at a local Urgent Care facility, please use the link below. It will take you to a list of all of our available [facility], including address, phone number and hours of operation. Please do not delay care.  [facility]  If you or a family member do not have a primary care provider, use the link below to schedule a visit and establish care. When you choose a [facility] primary care physician or advanced practice provider, you gain a long-term partner in health. Find a Primary Care Provider  Learn more about [facility]'s in-office and virtual care options: [facility] - Get Care Now

## 2023-12-08 NOTE — Telephone Encounter (Signed)
Chief Complaint: Most likely COVID + - Covid s/s With emphysema Symptoms: cough Frequency: today Pertinent Negatives: Patient denies  Disposition: [] ED /[] Urgent Care (no appt availability in office) / [] Appointment(In office/virtual)/ [x]  [facility]/ [] Home Care/ [] Refused Recommended Disposition /[] [facility]/ []  Follow-up with PCP Additional Notes: Returned pt's call. 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 Both have tested + for COVID. Sister was admitted to hospital. Pt is not able to travel well d/t hip sx. Pt has called pcp, but was unable to get an appt.  Scheduled MYChart VV for this afternoon.  Pt states she is also having a vaginal issue.     Summary: COVID Advice   Pt has covid and emphysema. What can she do to fight the COVID? Please advise.      Mailbox is full unable to leave message.  Called home number.    Reason for Disposition  [1] HIGH RISK patient (e.g., weak immune system, age > 64 years, obesity with BMI 30 or higher, pregnant, chronic lung disease or other chronic medical condition) AND [2] COVID symptoms (e.g., cough, fever)  (Exceptions: Already seen by PCP and no new or worsening symptoms.)  Answer Assessment - Initial Assessment Questions 1. COVID-19 EXPOSURE: "Please describe how you were exposed to someone with a COVID-19 infection."     Was cared for by 2 COVID + people 2. PLACE of CONTACT: "Where were you when you were exposed to COVID-19?" (e.g., home, school, medical waiting room; which city?)     At home 3. TYPE of CONTACT: "How much contact was there?" (e.g., sitting next to, live in same house, work in same office, same building)     close 4. DURATION of CONTACT: "How long were you in contact with the COVID-19 patient?" (e.g., a few seconds, passed by person, a few minutes, 15 minutes or longer, live with the patient)     yes 5. MASK: "Were you wearing a mask?"  "Was the other person wearing a mask?" Note: wearing a mask reduces the risk of an otherwise close contact.     no 6. DATE of CONTACT: "When did you have contact with a COVID-19 patient?" (e.g., how many days ago)     days  8. SYMPTOMS: "Do you have any symptoms?" (e.g., fever, cough, breathing difficulty, loss of taste or smell)     Thick green phlegm 11. HIGH RISK: "Do you have any heart or lung problems?" (e.g., asthma, COPD, heart failure) "Do you have a weak immune system or other risk factors?" (e.g., HIV positive, chemotherapy, renal failure, diabetes mellitus, sickle cell anemia, obesity)       emphysema  Answer Assessment - Initial Assessment Questions 1. COVID-19 DIAGNOSIS: "How do you know that you have COVID?" (e.g., positive lab test or self-test, diagnosed by doctor or NP/PA, symptoms after exposure).     S/s 2. COVID-19 EXPOSURE: "Was there any known exposure to COVID before the symptoms began?" CDC Definition of close contact: within 6 feet (2 meters) for a total of 15 minutes or more over a 24-hour period.      yes 3. ONSET: "When did the COVID-19 symptoms start?"      today 4. WORST SYMPTOM: "What is your worst symptom?" (e.g., cough, fever, shortness of breath, muscle aches)     Cough - green phlegm 5. COUGH: "Do you have a cough?" If Yes, ask: "How bad is the cough?"  yes  7. RESPIRATORY STATUS: "Describe your breathing?" (e.g., normal; shortness of breath, wheezing, unable to speak)      Mild SOB 8. BETTER-SAME-WORSE: "Are you getting better, staying the same or getting worse compared to yesterday?"  If getting worse, ask, "In what way?"     Worse 10. HIGH RISK DISEASE: "Do you have any chronic medical problems?" (e.g., asthma, heart or lung disease, weak immune system, obesity, etc.)       Lung  Protocols used: Coronavirus (COVID-19) Exposure-A-AH, Coronavirus (COVID-19) Diagnosed or Suspected-A-AH

## 2023-12-08 NOTE — Telephone Encounter (Signed)
Summary: COVID Advice   Pt has covid and emphysema. What can she do to fight the COVID? Please advise.     Called pt - unable to leave message - mailbox is full.
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
## 2024-01-03 ENCOUNTER — Ambulatory Visit: Payer: Medicaid Other | Attending: Otolaryngology

## 2024-01-03 DIAGNOSIS — J45909 Unspecified asthma, uncomplicated: Secondary | ICD-10-CM | POA: Diagnosis not present

## 2024-01-03 DIAGNOSIS — G4719 Other hypersomnia: Secondary | ICD-10-CM | POA: Diagnosis not present

## 2024-01-03 DIAGNOSIS — R0683 Snoring: Secondary | ICD-10-CM | POA: Insufficient documentation

## 2024-01-03 DIAGNOSIS — J449 Chronic obstructive pulmonary disease, unspecified: Secondary | ICD-10-CM | POA: Insufficient documentation

## 2024-01-03 DIAGNOSIS — Z9981 Dependence on supplemental oxygen: Secondary | ICD-10-CM | POA: Insufficient documentation

## 2024-01-03 DIAGNOSIS — G4733 Obstructive sleep apnea (adult) (pediatric): Secondary | ICD-10-CM | POA: Insufficient documentation

## 2024-01-03 DIAGNOSIS — E119 Type 2 diabetes mellitus without complications: Secondary | ICD-10-CM | POA: Insufficient documentation

## 2024-01-03 DIAGNOSIS — E669 Obesity, unspecified: Secondary | ICD-10-CM | POA: Diagnosis not present

## 2024-01-03 DIAGNOSIS — G4761 Periodic limb movement disorder: Secondary | ICD-10-CM | POA: Diagnosis not present

## 2024-04-01 ENCOUNTER — Other Ambulatory Visit: Payer: Self-pay | Admitting: Physician Assistant

## 2024-04-01 DIAGNOSIS — Z1231 Encounter for screening mammogram for malignant neoplasm of breast: Secondary | ICD-10-CM

## 2024-04-28 ENCOUNTER — Other Ambulatory Visit: Payer: Self-pay | Admitting: Medical Genetics

## 2024-05-06 ENCOUNTER — Encounter: Payer: Self-pay | Admitting: Internal Medicine

## 2024-05-07 ENCOUNTER — Encounter: Payer: Self-pay | Admitting: Internal Medicine

## 2024-05-07 ENCOUNTER — Ambulatory Visit: Admitting: General Practice

## 2024-05-07 ENCOUNTER — Ambulatory Visit
Admission: RE | Admit: 2024-05-07 | Discharge: 2024-05-07 | Disposition: A | Discharge status: Home / Self Care | Attending: Internal Medicine | Admitting: Internal Medicine

## 2024-05-07 ENCOUNTER — Encounter
Admission: RE | Disposition: A | Discharge status: Home / Self Care | Payer: Self-pay | Source: Home / Self Care | Attending: Internal Medicine

## 2024-05-07 DIAGNOSIS — Z83719 Family history of colon polyps, unspecified: Secondary | ICD-10-CM | POA: Insufficient documentation

## 2024-05-07 DIAGNOSIS — Z1211 Encounter for screening for malignant neoplasm of colon: Secondary | ICD-10-CM | POA: Diagnosis present

## 2024-05-07 DIAGNOSIS — E039 Hypothyroidism, unspecified: Secondary | ICD-10-CM | POA: Diagnosis not present

## 2024-05-07 DIAGNOSIS — K5900 Constipation, unspecified: Secondary | ICD-10-CM | POA: Diagnosis not present

## 2024-05-07 DIAGNOSIS — R1313 Dysphagia, pharyngeal phase: Secondary | ICD-10-CM | POA: Insufficient documentation

## 2024-05-07 DIAGNOSIS — K641 Second degree hemorrhoids: Secondary | ICD-10-CM | POA: Insufficient documentation

## 2024-05-07 DIAGNOSIS — Z7984 Long term (current) use of oral hypoglycemic drugs: Secondary | ICD-10-CM | POA: Diagnosis not present

## 2024-05-07 DIAGNOSIS — Z79899 Other long term (current) drug therapy: Secondary | ICD-10-CM | POA: Insufficient documentation

## 2024-05-07 DIAGNOSIS — E119 Type 2 diabetes mellitus without complications: Secondary | ICD-10-CM | POA: Diagnosis not present

## 2024-05-07 DIAGNOSIS — Z7985 Long-term (current) use of injectable non-insulin antidiabetic drugs: Secondary | ICD-10-CM | POA: Insufficient documentation

## 2024-05-07 DIAGNOSIS — J449 Chronic obstructive pulmonary disease, unspecified: Secondary | ICD-10-CM | POA: Diagnosis not present

## 2024-05-07 DIAGNOSIS — K642 Third degree hemorrhoids: Secondary | ICD-10-CM | POA: Diagnosis not present

## 2024-05-07 DIAGNOSIS — Z860100 Personal history of colon polyps, unspecified: Secondary | ICD-10-CM | POA: Insufficient documentation

## 2024-05-07 HISTORY — PX: ESOPHAGOGASTRODUODENOSCOPY: SHX5428

## 2024-05-07 HISTORY — PX: COLONOSCOPY: SHX5424

## 2024-05-07 HISTORY — PX: MALONEY DILATION: SHX5535

## 2024-05-07 LAB — GLUCOSE, CAPILLARY: Glucose-Capillary: 108 mg/dL — ABNORMAL HIGH (ref 70–99)

## 2024-05-07 SURGERY — COLONOSCOPY
Anesthesia: General

## 2024-05-07 MED ORDER — GLYCOPYRROLATE 0.2 MG/ML IJ SOLN
INTRAMUSCULAR | Status: AC
  Filled 2024-05-07: qty 1

## 2024-05-07 MED ORDER — DEXMEDETOMIDINE HCL IN NACL 80 MCG/20ML IV SOLN
INTRAVENOUS | Status: DC | PRN
  Administered 2024-05-07: 20 ug via INTRAVENOUS
  Administered 2024-05-07: 16 ug via INTRAVENOUS

## 2024-05-07 MED ORDER — SODIUM CHLORIDE 0.9 % IV SOLN
INTRAVENOUS | Status: DC
Start: 2024-05-07 — End: 2024-05-07

## 2024-05-07 MED ORDER — PROPOFOL 1000 MG/100ML IV EMUL
INTRAVENOUS | Status: AC
  Filled 2024-05-07: qty 100

## 2024-05-07 MED ORDER — PROPOFOL 10 MG/ML IV BOLUS
INTRAVENOUS | Status: DC | PRN
  Administered 2024-05-07: 50 mg via INTRAVENOUS
  Administered 2024-05-07: 40 mg via INTRAVENOUS
  Administered 2024-05-07: 50 mg via INTRAVENOUS

## 2024-05-07 MED ORDER — GLYCOPYRROLATE 0.2 MG/ML IJ SOLN
INTRAMUSCULAR | Status: DC | PRN
  Administered 2024-05-07: .2 mg via INTRAVENOUS

## 2024-05-07 MED ORDER — LIDOCAINE HCL (CARDIAC) PF 100 MG/5ML IV SOSY
PREFILLED_SYRINGE | INTRAVENOUS | Status: DC | PRN
  Administered 2024-05-07: 60 mg via INTRAVENOUS

## 2024-05-07 MED ORDER — PROPOFOL 500 MG/50ML IV EMUL
INTRAVENOUS | Status: DC | PRN
  Administered 2024-05-07: 75 ug/kg/min via INTRAVENOUS

## 2024-05-07 MED ORDER — LIDOCAINE HCL (PF) 2 % IJ SOLN
INTRAMUSCULAR | Status: AC
  Filled 2024-05-07: qty 5

## 2024-05-07 NOTE — Interval H&P Note (Signed)
 History and Physical Interval Note:  05/07/2024 9:35 AM  Monica Terry  has presented today for surgery, with the diagnosis of - K59.09 (ICD-10-CM) - Chronic constipation V18.51 (ICD-9-CM) - Z83.719 (ICD-10-CM) - Family history of polyps in the colon 787.20 (ICD-9-CM) - R13.10 (ICD-10-CM) - Dysphagia, unspecified type.  The various methods of treatment have been discussed with the patient and family. After consideration of risks, benefits and other options for treatment, the patient has consented to  Procedure(s): COLONOSCOPY (N/A) EGD (ESOPHAGOGASTRODUODENOSCOPY) (N/A) as a surgical intervention.  The patient's history has been reviewed, patient examined, no change in status, stable for surgery.  I have reviewed the patient's chart and labs.  Questions were answered to the patient's satisfaction.     [place], Kadisha Goodine

## 2024-05-07 NOTE — Anesthesia Postprocedure Evaluation (Signed)
 Anesthesia Post Note  Patient: Monica Terry  Procedure(s) Performed: COLONOSCOPY EGD (ESOPHAGOGASTRODUODENOSCOPY) DILATION, ESOPHAGUS, USING MALONEY DILATOR  Patient location during evaluation: Endoscopy Anesthesia Type: General Level of consciousness: awake and alert Pain management: pain level controlled Vital Signs Assessment: post-procedure vital signs reviewed and stable Respiratory status: spontaneous breathing, nonlabored ventilation, respiratory function stable and patient connected to nasal cannula oxygen Cardiovascular status: blood pressure returned to baseline and stable Postop Assessment: no apparent nausea or vomiting Anesthetic complications: no  No notable events documented.   Last Vitals:  Vitals:   05/07/24 1030 05/07/24 1040  BP: (!) 84/56 94/69  Pulse: 78 73  Resp: 15 15  Temp:    SpO2: 99% 99%    Last Pain:  Vitals:   05/07/24 1010  TempSrc: Temporal                 Enrique Harvest

## 2024-05-07 NOTE — H&P (Signed)
 Outpatient short stay form Pre-procedure 05/07/2024 9:32 AM Monica Terry Monica Terry, M.D.  Primary Physician: Monica Terry, M.D.  Reason for visit:  Dysphagia, FH colon polyps, Chronic constipation  History of present illness:  Monica Terry reports having some improvement in constipation with increasing Amitiza  dose from 8 mcg twice daily to 24 mcg twice daily. She is still using Colace and having a bowel movement about every 3 days. She has not tried adding MiraLAX . She feels she gets adequate fiber and fluid. She denies any lower abdominal pain or rectal bleeding. No alternating diarrhea.  She denies any GERD symptoms. She does continue to have some dysphagia to both food and liquids in her upper esophageal area. She denies any pill dysphagia. She denies nausea or vomiting.  She reports having some weight gain recently due to thyroid medication adjustment. She denies tobacco alcohol NSAIDs.     Current Facility-Administered Medications:    0.9 %  sodium chloride  infusion, , Intravenous, Continuous, [place], Korynne Dols K, MD, Last Rate: 20 mL/hr at 05/07/24 1308, Continued from Pre-op at 05/07/24 0916  Medications Prior to Admission  Medication Sig Dispense Refill Last Dose/Taking   buPROPion  (WELLBUTRIN  XL) 150 MG 24 hr tablet Take 150 mg daily by mouth.   05/06/2024   levothyroxine  (SYNTHROID ) 125 MCG tablet Take 125 mcg by mouth daily before breakfast. Take 125 mcg on Sat and Sunday   05/06/2024   montelukast  (SINGULAIR ) 10 MG tablet Take 10 mg by mouth daily.   05/06/2024   nortriptyline  (PAMELOR ) 10 MG capsule Take 10 mg by mouth at bedtime.   05/06/2024   Oxycodone  HCl 10 MG TABS Take 10 mg by mouth 4 (four) times daily as needed (pain).   05/07/2024 Morning   pregabalin  (LYRICA ) 150 MG capsule Take 150 mg by mouth in the morning, at noon, and at bedtime.    05/06/2024   acetaminophen  (TYLENOL ) 500 MG tablet Take 2 tablets (1,000 mg total) by mouth every 6 (six) hours. 30 tablet 0    albuterol   (PROVENTIL ) (2.5 MG/3ML) 0.083% nebulizer solution Take 2.5 mg by nebulization every 6 (six) hours as needed for wheezing or shortness of breath.      albuterol  (VENTOLIN  HFA) 108 (90 Base) MCG/ACT inhaler Inhale 2 puffs into the lungs every 6 (six) hours as needed for wheezing or shortness of breath.      cetirizine (ZYRTEC) 10 MG tablet Take 10 mg by mouth daily.       chlorhexidine  (HIBICLENS ) 4 % external liquid Apply 15 mLs (1 Application total) topically as directed for 30 doses. Use as directed daily for 5 days every other week for 6 weeks. 946 mL 1    docusate sodium  (COLACE) 100 MG capsule Take 100 mg by mouth 2 (two) times daily.      enoxaparin  (LOVENOX ) 40 MG/0.4ML injection Inject 0.4 mLs (40 mg total) into the skin daily for 14 days. 5.6 mL 0    ergocalciferol  (VITAMIN D2) 1.25 MG (50000 UT) capsule Take 50,000 Units by mouth every Tuesday.      estradiol  (ESTRACE ) 1 MG tablet Take 1 tablet (1 mg total) by mouth daily. 30 tablet 12    fluticasone  (FLONASE ) 50 MCG/ACT nasal spray Place 2 sprays into both nostrils daily as needed for allergies or rhinitis.      Fluticasone -Salmeterol (ADVAIR) 250-50 MCG/DOSE AEPB Inhale 1 puff into the lungs 2 (two) times daily.      lubiprostone  (AMITIZA ) 24 MCG capsule Take 24 mcg by mouth 2 (two)  times daily with a meal.      lurasidone  (LATUDA ) 80 MG TABS tablet Take 80 mg by mouth daily with breakfast.      metFORMIN  (GLUCOPHAGE ) 500 MG tablet Take 500 mg by mouth 2 (two) times daily with a meal.      mupirocin  ointment (BACTROBAN ) 2 % Apply small amount to the inside of both nostrils TWICE a day for the next 5 days. 15 g 0    ondansetron  (ZOFRAN ) 4 MG tablet Take 1 tablet (4 mg total) by mouth daily as needed for nausea or vomiting. 20 tablet 0    ondansetron  (ZOFRAN ) 4 MG tablet Take 1 tablet (4 mg total) by mouth every 6 (six) hours as needed for nausea. 20 tablet 0    oxyCODONE  (ROXICODONE ) 5 MG immediate release tablet Take 1 tablet (5 mg total)  by mouth every 6 (six) hours as needed for breakthrough pain. 30 tablet 0    OXYGEN Inhale 2 L into the lungs at bedtime.      predniSONE  (DELTASONE ) 5 MG tablet Take 5 mg by mouth daily as needed.      rizatriptan (MAXALT) 5 MG tablet Take 5 mg by mouth as needed for migraine. May repeat in 2 hours if needed      Semaglutide, 2 MG/DOSE, (OZEMPIC, 2 MG/DOSE,) 8 MG/3ML SOPN Inject 2 mg into the skin every Tuesday.   04/22/2024   simvastatin  (ZOCOR ) 40 MG tablet Take 40 mg by mouth at bedtime.      tiotropium (SPIRIVA  HANDIHALER) 18 MCG inhalation capsule Place 18 mcg into inhaler and inhale daily in the afternoon.      tiZANidine  (ZANAFLEX ) 4 MG tablet Take 4 mg by mouth every 6 (six) hours as needed for muscle spasms.      valACYclovir (VALTREX) 1000 MG tablet Take 1,000 mg by mouth 2 (two) times daily as needed (for 2 days at start of cold sore).      Varenicline Tartrate (CHANTIX STARTING MONTH PAK PO) Take 1 tablet by mouth 2 (two) times daily.        Allergies  Allergen Reactions   Vancomycin  Other (See Comments)    Facial flushing, pruritus, erythema (unknown if only at infusion site or generalized/diffuse)     Past Medical History:  Diagnosis Date   Abnormal uterine bleeding    Anemia    Anxiety    Arthritis    NECK   Asplenia after surgical procedure    Terry's palsy    right side   Bipolar 1 disorder (HCC)    Brittle bones    Carpal tunnel syndrome, bilateral    Cervical radiculopathy    Complication of anesthesia    had to come back to ER after shoulder scope block breathing diffuculty   COPD (chronic obstructive pulmonary disease) (HCC)    Depression    Diabetes mellitus without complication (HCC)    GERD (gastroesophageal reflux disease)    Heart murmur    Hepatitis C 2016   c: treated by Dr. Ole Berkeley Jerold Terry)   History of kidney stones    Hypercholesteremia    Hyperlipidemia    Hypothyroidism    Meningitis 01/20/2017   due to streptococcus pneumoniae   Migraine  headache    after meningitis.  none since starting nortriptyline    Neck pain    Neck pain, chronic    Nose colonized with MRSA    a.) surgical PCR (+) 11/07/2023 prior to RIGHT THA   Panic attack  Sciatica    Shortness of breath dyspnea    Thyroid disease    Type 2 diabetes mellitus (HCC)    Viral meningitis 2018   and bacterial   Wears dentures    full upper    Review of systems:  Otherwise negative.    Physical Exam  Gen: Alert, oriented. Appears stated age.  HEENT: [place]/AT. PERRLA. Lungs: CTA, no wheezes. CV: RR nl S1, S2. Abd: soft, benign, no masses. BS+ Ext: No edema. Pulses 2+    Planned procedures: Proceed with EGD and colonoscopy. The patient understands the nature of the planned procedure, indications, risks, alternatives and potential complications including but not limited to bleeding, infection, perforation, damage to internal organs and possible oversedation/side effects from anesthesia. The patient agrees and gives consent to proceed.  Please refer to procedure notes for findings, recommendations and patient disposition/instructions.     Stephenia Vogan Monica Terry, M.D. Gastroenterology 05/07/2024  9:32 AM

## 2024-05-07 NOTE — Anesthesia Preprocedure Evaluation (Signed)
 Anesthesia Evaluation  Patient identified by MRN, date of birth, ID band Patient awake    Reviewed: Allergy & Precautions, H&P , NPO status , Patient's Chart, lab work & pertinent test results, reviewed documented beta blocker date and time   History of Anesthesia Complications (+) PONV and history of anesthetic complications  Airway Mallampati: II  TM Distance: >3 FB Neck ROM: full    Dental  (+) Teeth Intact   Pulmonary shortness of breath, COPD,  COPD inhaler, former smoker   Pulmonary exam normal        Cardiovascular Exercise Tolerance: Poor Normal cardiovascular exam+ Valvular Problems/Murmurs  Rhythm:regular Rate:Normal     Neuro/Psych  Headaches PSYCHIATRIC DISORDERS Anxiety Depression Bipolar Disorder    Neuromuscular disease    GI/Hepatic ,GERD  Medicated,,(+) Hepatitis -  Endo/Other  diabetes, Well ControlledHypothyroidism Hyperthyroidism   Renal/GU      Musculoskeletal   Abdominal   Peds  Hematology  (+) Blood dyscrasia, anemia   Anesthesia Other Findings Past Medical History: No date: Abnormal uterine bleeding No date: Anemia No date: Anxiety No date: Arthritis     Comment:  NECK No date: Asplenia after surgical procedure No date: Bell's palsy     Comment:  right side No date: Bipolar 1 disorder (HCC) No date: Brittle bones No date: Carpal tunnel syndrome, bilateral No date: Cervical radiculopathy No date: Complication of anesthesia     Comment:  had to come back to ER after shoulder scope block               breathing diffuculty No date: COPD (chronic obstructive pulmonary disease) (HCC) No date: Depression No date: Diabetes mellitus without complication (HCC) No date: GERD (gastroesophageal reflux disease) No date: Heart murmur 2016: Hepatitis C     Comment:  c: treated by Dr. Ole Berkeley Jerold Moor) No date: History of kidney stones No date: Hypercholesteremia No date: Hyperlipidemia No date:  Hypothyroidism 01/20/2017: Meningitis     Comment:  due to streptococcus pneumoniae No date: Migraine headache     Comment:  after meningitis.  none since starting nortriptyline  No date: Neck pain No date: Neck pain, chronic No date: Nose colonized with MRSA     Comment:  a.) surgical PCR (+) 11/07/2023 prior to RIGHT THA No date: Panic attack No date: Sciatica No date: Shortness of breath dyspnea No date: Thyroid disease No date: Type 2 diabetes mellitus (HCC) 2018: Viral meningitis     Comment:  and bacterial No date: Wears dentures     Comment:  full upper Past Surgical History: No date: ABDOMINAL HYSTERECTOMY 2009: ANTERIOR FUSION CERVICAL SPINE 11/22/2017: CARPAL TUNNEL RELEASE; Right     Comment:  Procedure: CARPAL TUNNEL RELEASE;  Surgeon: Josephus Nida, MD;  Location: [facility];  Service: Orthopedics;                Laterality: Right; 09/2017: CARPAL TUNNEL RELEASE; Left 03/27/2018: COLONOSCOPY WITH PROPOFOL ; N/A     Comment:  Procedure: COLONOSCOPY WITH PROPOFOL ;  Surgeon: Toledo,               Alphonsus Jeans, MD;  Location: [facility];  Service:               Gastroenterology;  Laterality: N/A; 03/27/2018: ESOPHAGOGASTRODUODENOSCOPY (EGD) WITH PROPOFOL ; N/A     Comment:  Procedure: ESOPHAGOGASTRODUODENOSCOPY (EGD) WITH               PROPOFOL ;  Surgeon: Toledo, Alphonsus Jeans, MD;  Location:               [facility];  Service: Gastroenterology;  Laterality:               N/A; 02/08/2016: HYSTEROSCOPY WITH D & C; N/A     Comment:  Procedure: DILATATION AND CURETTAGE /HYSTEROSCOPY;                Surgeon: Colan Dash, MD;  Location: [facility];                Service: Gynecology;  Laterality: N/A; 11/27/2020: KNEE ARTHROSCOPY WITH MEDIAL MENISECTOMY; Right     Comment:  Procedure: Right arthroscopic partial medial               meniscectomy, chondroplasty, and medial tibial               subchondroplasty;  Surgeon: Lorri Rota, MD;  Location:                [facility];  Service: Orthopedics;  Laterality: Right; 08/07/2023: ROBOTIC ASSISTED LAPAROSCOPIC CHOLECYSTECTOMY 04/11/2016: SALPINGOOPHORECTOMY; Bilateral     Comment:  Procedure: SALPINGO OOPHORECTOMY;  Surgeon: Colan Dash, MD;  Location: [facility];  Service:               Gynecology;  Laterality: Bilateral; 01/04/2019: SHOULDER ARTHROSCOPY; Right     Comment:  Procedure: ARTHROSCOPY SHOULDER WITH ARTHROSCOPIC                DISTAL CLAVICLE EXCISION, SUBACROMIAL AND DECOMPRESSION               BICEPS TENOTOMY;  Surgeon: Lorri Rota, MD;  Location:               [facility];  Service: Orthopedics;  Laterality:               Right;  TODD MUNDY ASSISTING ARTHROCARE WAND FLOW 90               WAND [place] CHAIR WITH SPYDER SLING AFTER               SURGERY Diabetic - injectible 1984: SPLENECTOMY, TOTAL 03/09/2021: TOTAL KNEE ARTHROPLASTY; Left     Comment:  Procedure: TOTAL KNEE ARTHROPLASTY - Thomos Flies to               Assist;  Surgeon: Molli Angelucci, MD;  Location: [facility];              Service: Orthopedics;  Laterality: Left; 12/16/2021: TOTAL KNEE ARTHROPLASTY; Right     Comment:  Procedure: TOTAL KNEE ARTHROPLASTY;  Surgeon: Molli Angelucci, MD;  Location: [facility];  Service: Orthopedics;               Laterality: Right; No date: TUBAL LIGATION 04/11/2016: VAGINAL HYSTERECTOMY; Bilateral     Comment:  Procedure: HYSTERECTOMY VAGINAL;  Surgeon: Colan Dash, MD;  Location: [facility];  Service:               Gynecology;  Laterality: Bilateral; BMI    Body Mass Index: 27.83 kg/m     Reproductive/Obstetrics negative OB ROS  Anesthesia Physical Anesthesia Plan  ASA: 3  Anesthesia Plan: General   Post-op Pain Management: Minimal or no pain anticipated   Induction: Intravenous  PONV Risk Score and Plan: 3 and Propofol  infusion, TIVA  and Ondansetron   Airway Management Planned: Nasal Cannula  Additional Equipment: None  Intra-op Plan:   Post-operative Plan:   Informed Consent: I have reviewed the patients History and Physical, chart, labs and discussed the procedure including the risks, benefits and alternatives for the proposed anesthesia with the patient or authorized representative who has indicated his/her understanding and acceptance.     Dental advisory given  Plan Discussed with: CRNA and Surgeon  Anesthesia Plan Comments: (Discussed risks of anesthesia with patient, including possibility of difficulty with spontaneous ventilation under anesthesia necessitating airway intervention, PONV, and rare risks such as cardiac or respiratory or neurological events, and allergic reactions. Discussed the role of CRNA in patient's perioperative care. Patient understands.)       Anesthesia Quick Evaluation

## 2024-05-07 NOTE — Op Note (Signed)
 [facility] Gastroenterology Patient Name: Monica Terry Procedure Date: 05/07/2024 9:32 AM MRN: [id number] Account #: [id number] Date of Birth: 1965/12/31 Admit Type: Outpatient Age: 58 Room: [facility] ENDO ROOM 1 Gender: Female Note Status: Finalized Instrument Name: Upper Endoscope [id number] Procedure:             Upper GI endoscopy Indications:           Pharyngeal phase dysphagia Providers:             Charda Janis K. [name] MD, MD Referring MD:          Dorothye Berni K. [name] MD, MD (Referring MD) Medicines:             Propofol  per Anesthesia Complications:         No immediate complications. Estimated blood loss: None. Procedure:             Pre-Anesthesia Assessment:                        - The risks and benefits of the procedure and the                         sedation options and risks were discussed with the                         patient. All questions were answered and informed                         consent was obtained.                        - Patient identification and proposed procedure were                         verified prior to the procedure by the nurse. The                         procedure was verified in the procedure room.                        - ASA Grade Assessment: III - A patient with severe                         systemic disease.                        - After reviewing the risks and benefits, the patient                         was deemed in satisfactory condition to undergo the                         procedure.                        After obtaining informed consent, the endoscope was                         passed under direct vision. Throughout the procedure,  the patient's blood pressure, pulse, and oxygen                         saturations were monitored continuously. The Endoscope                         was introduced through the mouth, and advanced to the                         third part of duodenum.  The upper GI endoscopy was                         accomplished without difficulty. The patient tolerated                         the procedure well. Findings:      No endoscopic abnormality was evident in the esophagus to explain the       patient's complaint of dysphagia. It was decided, however, to proceed       with dilation of the lower third of the esophagus. The scope was       withdrawn. Dilation was performed with a Maloney dilator with no       resistance at 54 Fr.      The examined esophagus was normal.      The stomach was normal.      The examined duodenum was normal. Impression:            - No endoscopic esophageal abnormality to explain                         patient's dysphagia. Esophagus dilated. Dilated.                        - Normal esophagus.                        - Normal stomach.                        - Normal examined duodenum.                        - No specimens collected. Recommendation:        - Monitor results to esophageal dilation                        - Proceed with colonoscopy Procedure Code(s):     --- Professional ---                        [phone], Esophagogastroduodenoscopy, flexible,                         transoral; diagnostic, including collection of                         specimen(s) by brushing or washing, when performed                         (separate procedure)  91478, Dilation of esophagus, by unguided sound or                         bougie, single or multiple passes Diagnosis Code(s):     --- Professional ---                        R13.13, Dysphagia, pharyngeal phase CPT copyright 2022 American [employer]. All rights reserved. The codes documented in this report are preliminary and upon coder review may  be revised to meet current compliance requirements. Cassie Click MD, MD 05/07/2024 9:53:29 AM This report has been signed electronically. Number of Addenda: 0 Note Initiated On: 05/07/2024  9:32 AM Estimated Blood Loss:  Estimated blood loss: none.      [facility]

## 2024-05-07 NOTE — Transfer of Care (Signed)
 Immediate Anesthesia Transfer of Care Note  Patient: Monica Terry  Procedure(s) Performed: COLONOSCOPY EGD (ESOPHAGOGASTRODUODENOSCOPY) DILATION, ESOPHAGUS, USING MALONEY DILATOR  Patient Location: PACU  Anesthesia Type:General  Level of Consciousness: sedated  Airway & Oxygen Therapy: Patient Spontanous Breathing  Post-op Assessment: Report given to RN and Post -op Vital signs reviewed and stable  Post vital signs: Reviewed and stable  Last Vitals:  Vitals Value Taken Time  BP 75/49 05/07/24 1013  Temp 35.7 C 05/07/24 1010  Pulse 82 05/07/24 1013  Resp 19 05/07/24 1013  SpO2 99 % 05/07/24 1013    Last Pain:  Vitals:   05/07/24 1010  TempSrc: Temporal         Complications: No notable events documented.

## 2024-05-07 NOTE — Op Note (Addendum)
 [facility] Gastroenterology Patient Name: Monica Terry Procedure Date: 05/07/2024 9:32 AM MRN: [id number] Account #: [id number] Date of Birth: [birth year] Admit Type: Outpatient Age: 58 Room: [facility] ENDO ROOM 1 Gender: Female Note Status: Supervisor Override Instrument Name: Charlyn Cooley [id number] Procedure:             Colonoscopy Indications:           Colon cancer screening in patient at increased risk:                         Family history of 1st-degree relative with colon                         polyps, High risk colon cancer surveillance: Personal                         history of non-advanced adenoma, Chronic idiopathic                         constipation Providers:             Amiyrah Lamere K. [name] MD, MD Referring MD:          Jimmi Sidener K. [name] MD, MD (Referring MD) Medicines:             Propofol  per Anesthesia Complications:         No immediate complications. Procedure:             Pre-Anesthesia Assessment:                        - The risks and benefits of the procedure and the                         sedation options and risks were discussed with the                         patient. All questions were answered and informed                         consent was obtained.                        - Patient identification and proposed procedure were                         verified prior to the procedure by the nurse. The                         procedure was verified in the procedure room.                        - ASA Grade Assessment: III - A patient with severe                         systemic disease.                        - After reviewing the risks and benefits, the patient  was deemed in satisfactory condition to undergo the                         procedure.                        After obtaining informed consent, the colonoscope was                         passed under direct vision. Throughout the procedure,                          the patient's blood pressure, pulse, and oxygen                         saturations were monitored continuously. The                         Colonoscope was introduced through the anus and                         advanced to the the cecum, identified by appendiceal                         orifice and ileocecal valve. The colonoscopy was                         performed without difficulty. The patient tolerated                         the procedure well. The quality of the bowel                         preparation was good. The ileocecal valve, appendiceal                         orifice, and rectum were photographed. Findings:      The perianal exam findings include internal hemorrhoids that prolapse       with straining, but require manual replacement into the anal canal       (Grade III).      Non-bleeding internal hemorrhoids were found during retroflexion. The       hemorrhoids were Grade II (internal hemorrhoids that prolapse but reduce       spontaneously).      No other significant abnormalities were identified in a careful       examination of the remainder of the colon. Impression:            - Internal hemorrhoids that prolapse with straining,                         but require manual replacement into the anal canal                         (Grade III) found on perianal exam.                        - Non-bleeding internal hemorrhoids.                        -  No specimens collected. Recommendation:        - Monitor results to esophageal dilation                        - Patient has a contact number available for                         emergencies. The signs and symptoms of potential                         delayed complications were discussed with the patient.                         Return to normal activities tomorrow. Written                         discharge instructions were provided to the patient.                        - Resume previous diet.                         - Continue present medications.                        - Repeat colonoscopy in 10 years for screening                         purposes.                        - Follow up with Laquetta Plank, PA-C at [facility]. [phone].                        - Telephone GI office to schedule appointment in 3                         months.                        - The findings and recommendations were discussed with                         the patient. Procedure Code(s):     --- Professional ---                        Z6109, Colorectal cancer screening; colonoscopy on                         individual at high risk Diagnosis Code(s):     --- Professional ---                        K64.2, Third degree hemorrhoids                        Z86.010, Personal history of colonic polyps CPT copyright 2022 American Medical Association. All rights reserved. The codes documented in  this report are preliminary and upon coder review may  be revised to meet current compliance requirements. Cassie Click MD, MD 05/07/2024 10:09:26 AM This report has been signed electronically. Number of Addenda: 0 Note Initiated On: 05/07/2024 9:32 AM Scope Withdrawal Time: 0 hours 6 minutes 4 seconds  Total Procedure Duration: 0 hours 10 minutes 28 seconds  Estimated Blood Loss:  Estimated blood loss: none.      [facility]

## 2024-05-09 ENCOUNTER — Other Ambulatory Visit
Admission: RE | Admit: 2024-05-09 | Discharge: 2024-05-09 | Disposition: A | Discharge status: Home / Self Care | Payer: Self-pay | Source: Ambulatory Visit | Attending: Medical Genetics | Admitting: Medical Genetics

## 2024-05-21 LAB — GENECONNECT MOLECULAR SCREEN: Genetic Analysis Overall Interpretation: NEGATIVE

## 2024-05-30 ENCOUNTER — Ambulatory Visit: Admitting: Podiatry

## 2024-05-30 DIAGNOSIS — Z79899 Other long term (current) drug therapy: Secondary | ICD-10-CM | POA: Diagnosis not present

## 2024-05-30 DIAGNOSIS — B351 Tinea unguium: Secondary | ICD-10-CM

## 2024-05-30 DIAGNOSIS — L600 Ingrowing nail: Secondary | ICD-10-CM

## 2024-05-30 MED ORDER — TERBINAFINE HCL 250 MG PO TABS: 250.0000 mg | ORAL_TABLET | Freq: Every day | ORAL | 0 refills | Status: AC

## 2024-05-30 NOTE — Progress Notes (Signed)
 Subjective:  Patient ID: Monica Terry, female    DOB: [birth year],  MRN: [id number]  Chief Complaint  Patient presents with   Nail Problem    Bilateral hallux nail discoloration     58 y.o. female presents with the above complaint.  Patient presents with left hallux medial border ingrown painful to touch as well as gotten worse worse with ambulation and shoe pressure patient would like to discuss removal of it.  She states is been causing a lot of discomfort.  She has secondary complaint of nail discoloration to toenails x 10.  She states been present for quite some time she tried topical medication which has not helped.  She would like to discuss Lamisil .  She had a recent liver function test done which seems normal.   Review of Systems: Negative except as noted in the HPI. Denies N/V/F/Ch.  Past Medical History:  Diagnosis Date   Abnormal uterine bleeding    Anemia    Anxiety    Arthritis    NECK   Asplenia after surgical procedure    Bell's palsy    right side   Bipolar 1 disorder (HCC)    Brittle bones    Carpal tunnel syndrome, bilateral    Cervical radiculopathy    Complication of anesthesia    had to come back to ER after shoulder scope block breathing diffuculty   COPD (chronic obstructive pulmonary disease) (HCC)    Depression    Diabetes mellitus without complication (HCC)    GERD (gastroesophageal reflux disease)    Heart murmur    Hepatitis C 2016   c: treated by Dr. Ole Berkeley [facility])   History of kidney stones    Hypercholesteremia    Hyperlipidemia    Hypothyroidism    Meningitis 01/20/2017   due to streptococcus pneumoniae   Migraine headache    after meningitis.  none since starting nortriptyline    Neck pain    Neck pain, chronic    Nose colonized with MRSA    a.) surgical PCR (+) 11/07/2023 prior to RIGHT THA   Panic attack    Sciatica    Shortness of breath dyspnea    Thyroid disease    Type 2 diabetes mellitus (HCC)    Viral meningitis 2018    and bacterial   Wears dentures    full upper    Current Outpatient Medications:    terbinafine  (LAMISIL ) 250 MG tablet, Take 1 tablet (250 mg total) by mouth daily., Disp: 90 tablet, Rfl: 0   acetaminophen  (TYLENOL ) 500 MG tablet, Take 2 tablets (1,000 mg total) by mouth every 6 (six) hours., Disp: 30 tablet, Rfl: 0   albuterol  (PROVENTIL ) (2.5 MG/3ML) 0.083% nebulizer solution, Take 2.5 mg by nebulization every 6 (six) hours as needed for wheezing or shortness of breath., Disp: , Rfl:    albuterol  (VENTOLIN  HFA) 108 (90 Base) MCG/ACT inhaler, Inhale 2 puffs into the lungs every 6 (six) hours as needed for wheezing or shortness of breath., Disp: , Rfl:    buPROPion  (WELLBUTRIN  XL) 150 MG 24 hr tablet, Take 150 mg daily by mouth., Disp: , Rfl:    cetirizine (ZYRTEC) 10 MG tablet, Take 10 mg by mouth daily. , Disp: , Rfl:    chlorhexidine  (HIBICLENS ) 4 % external liquid, Apply 15 mLs (1 Application total) topically as directed for 30 doses. Use as directed daily for 5 days every other week for 6 weeks., Disp: 946 mL, Rfl: 1   docusate sodium  (COLACE) 100 MG capsule,  Take 100 mg by mouth 2 (two) times daily., Disp: , Rfl:    enoxaparin  (LOVENOX ) 40 MG/0.4ML injection, Inject 0.4 mLs (40 mg total) into the skin daily for 14 days., Disp: 5.6 mL, Rfl: 0   ergocalciferol  (VITAMIN D2) 1.25 MG (50000 UT) capsule, Take 50,000 Units by mouth every Tuesday., Disp: , Rfl:    estradiol  (ESTRACE ) 1 MG tablet, Take 1 tablet (1 mg total) by mouth daily., Disp: 30 tablet, Rfl: 12   fluticasone  (FLONASE ) 50 MCG/ACT nasal spray, Place 2 sprays into both nostrils daily as needed for allergies or rhinitis., Disp: , Rfl:    Fluticasone -Salmeterol (ADVAIR) 250-50 MCG/DOSE AEPB, Inhale 1 puff into the lungs 2 (two) times daily., Disp: , Rfl:    levothyroxine  (SYNTHROID ) 125 MCG tablet, Take 125 mcg by mouth daily before breakfast. Take 125 mcg on Sat and Sunday, Disp: , Rfl:    lubiprostone  (AMITIZA ) 24 MCG capsule, Take  24 mcg by mouth 2 (two) times daily with a meal., Disp: , Rfl:    lurasidone  (LATUDA ) 80 MG TABS tablet, Take 80 mg by mouth daily with breakfast., Disp: , Rfl:    metFORMIN  (GLUCOPHAGE ) 500 MG tablet, Take 500 mg by mouth 2 (two) times daily with a meal., Disp: , Rfl:    montelukast  (SINGULAIR ) 10 MG tablet, Take 10 mg by mouth daily., Disp: , Rfl:    mupirocin  ointment (BACTROBAN ) 2 %, Apply small amount to the inside of both nostrils TWICE a day for the next 5 days., Disp: 15 g, Rfl: 0   nortriptyline  (PAMELOR ) 10 MG capsule, Take 10 mg by mouth at bedtime., Disp: , Rfl:    ondansetron  (ZOFRAN ) 4 MG tablet, Take 1 tablet (4 mg total) by mouth daily as needed for nausea or vomiting., Disp: 20 tablet, Rfl: 0   ondansetron  (ZOFRAN ) 4 MG tablet, Take 1 tablet (4 mg total) by mouth every 6 (six) hours as needed for nausea., Disp: 20 tablet, Rfl: 0   oxyCODONE  (ROXICODONE ) 5 MG immediate release tablet, Take 1 tablet (5 mg total) by mouth every 6 (six) hours as needed for breakthrough pain., Disp: 30 tablet, Rfl: 0   Oxycodone  HCl 10 MG TABS, Take 10 mg by mouth 4 (four) times daily as needed (pain)., Disp: , Rfl:    OXYGEN, Inhale 2 L into the lungs at bedtime., Disp: , Rfl:    predniSONE  (DELTASONE ) 5 MG tablet, Take 5 mg by mouth daily as needed., Disp: , Rfl:    pregabalin  (LYRICA ) 150 MG capsule, Take 150 mg by mouth in the morning, at noon, and at bedtime. , Disp: , Rfl:    rizatriptan (MAXALT) 5 MG tablet, Take 5 mg by mouth as needed for migraine. May repeat in 2 hours if needed, Disp: , Rfl:    Semaglutide, 2 MG/DOSE, (OZEMPIC, 2 MG/DOSE,) 8 MG/3ML SOPN, Inject 2 mg into the skin every Tuesday., Disp: , Rfl:    simvastatin  (ZOCOR ) 40 MG tablet, Take 40 mg by mouth at bedtime., Disp: , Rfl:    tiotropium (SPIRIVA  HANDIHALER) 18 MCG inhalation capsule, Place 18 mcg into inhaler and inhale daily in the afternoon., Disp: , Rfl:    tiZANidine  (ZANAFLEX ) 4 MG tablet, Take 4 mg by mouth every 6 (six)  hours as needed for muscle spasms., Disp: , Rfl:    valACYclovir (VALTREX) 1000 MG tablet, Take 1,000 mg by mouth 2 (two) times daily as needed (for 2 days at start of cold sore)., Disp: , Rfl:  Varenicline Tartrate (CHANTIX STARTING MONTH PAK PO), Take 1 tablet by mouth 2 (two) times daily., Disp: , Rfl:   Social History   Tobacco Use  Smoking Status Former   Current packs/day: 0.00   Average packs/day: 1 pack/day for 40.9 years (40.9 ttl pk-yrs)   Types: Cigarettes   Start date: 02/08/1974   Quit date: 2016   Years since quitting: 9.4   Passive exposure: Never  Smokeless Tobacco Never    Allergies  Allergen Reactions   Vancomycin  Other (See Comments)    Facial flushing, pruritus, erythema (unknown if only at infusion site or generalized/diffuse)   Objective:  There were no vitals filed for this visit. There is no height or weight on file to calculate BMI. Constitutional Well developed. Well nourished.  Vascular Dorsalis pedis pulses palpable bilaterally. Posterior tibial pulses palpable bilaterally. Capillary refill normal to all digits.  No cyanosis or clubbing noted. Pedal hair growth normal.  Neurologic Normal speech. Oriented to person, place, and time. Epicritic sensation to light touch grossly present bilaterally.  Dermatologic Painful ingrowing nail at medial nail borders of the hallux nail left. No other open wounds. No skin lesions.  Orthopedic: Normal joint ROM without pain or crepitus bilaterally. No visible deformities. No bony tenderness.   Radiographs: None Assessment:   1. Ingrown left greater toenail   2. Long-term use of high-risk medication   3. Onychomycosis due to dermatophyte    Plan:  Patient was evaluated and treated and all questions answered.  Onychomycosis toenails x 10 -Educated the patient on the etiology of onychomycosis and various treatment options associated with improving the fungal load.  I explained to the patient that there  is 3 treatment options available to treat the onychomycosis including topical, p.o., laser treatment.  Patient elected to undergo p.o. options with Lamisil /terbinafine  therapy.  In order for me to start the medication therapy, I explained to the patient the importance of evaluating the liver and obtaining the liver function test.  Once the liver function test comes back normal I will start him on 48-month course of Lamisil  therapy.  Patient understood all risk and would like to proceed with Lamisil  therapy.  I have asked the patient to immediately stop the Lamisil  therapy if she has any reactions to it and call the office or go to the emergency room right away.  Patient states understanding   Ingrown Nail, left -Patient elects to proceed with minor surgery to remove ingrown toenail removal today. Consent reviewed and signed by patient. -Ingrown nail excised. See procedure note. -Educated on post-procedure care including soaking. Written instructions provided and reviewed. -Patient to follow up in 2 weeks for nail check.  Procedure: Excision of Ingrown Toenail Location: Left 1st toe medial nail borders. Anesthesia: Lidocaine  1% plain; 1.5 mL and Marcaine  0.5% plain; 1.5 mL, digital block. Skin Prep: Betadine. Dressing: Silvadene; telfa; dry, sterile, compression dressing. Technique: Following skin prep, the toe was exsanguinated and a tourniquet was secured at the base of the toe. The affected nail border was freed, split with a nail splitter, and excised. Chemical matrixectomy was then performed with phenol and irrigated out with alcohol. The tourniquet was then removed and sterile dressing applied. Disposition: Patient tolerated procedure well. Patient to return in 2 weeks for follow-up.   No follow-ups on file.   Left medial ingrown  Toenails x 10 Lamisil  liver function looks good

## 2024-07-15 ENCOUNTER — Other Ambulatory Visit: Payer: Self-pay | Admitting: Emergency Medicine

## 2024-07-15 DIAGNOSIS — J302 Other seasonal allergic rhinitis: Secondary | ICD-10-CM

## 2024-07-15 DIAGNOSIS — J432 Centrilobular emphysema: Secondary | ICD-10-CM

## 2024-07-15 DIAGNOSIS — Z122 Encounter for screening for malignant neoplasm of respiratory organs: Secondary | ICD-10-CM

## 2024-07-15 DIAGNOSIS — Z87891 Personal history of nicotine dependence: Secondary | ICD-10-CM

## 2024-07-30 ENCOUNTER — Ambulatory Visit
Admission: RE | Admit: 2024-07-30 | Discharge: 2024-07-30 | Disposition: A | Discharge status: Home / Self Care | Source: Ambulatory Visit | Attending: Emergency Medicine | Admitting: Emergency Medicine

## 2024-07-30 DIAGNOSIS — Z87891 Personal history of nicotine dependence: Secondary | ICD-10-CM | POA: Diagnosis present

## 2024-07-30 DIAGNOSIS — J432 Centrilobular emphysema: Secondary | ICD-10-CM | POA: Insufficient documentation

## 2024-07-30 DIAGNOSIS — Z122 Encounter for screening for malignant neoplasm of respiratory organs: Secondary | ICD-10-CM | POA: Insufficient documentation

## 2024-07-30 DIAGNOSIS — J302 Other seasonal allergic rhinitis: Secondary | ICD-10-CM | POA: Diagnosis present

## 2024-08-16 ENCOUNTER — Other Ambulatory Visit: Payer: Self-pay | Admitting: Specialist

## 2024-08-16 DIAGNOSIS — R918 Other nonspecific abnormal finding of lung field: Secondary | ICD-10-CM

## 2024-08-20 ENCOUNTER — Encounter
Admission: RE | Admit: 2024-08-20 | Discharge: 2024-08-20 | Disposition: A | Discharge status: Home / Self Care | Source: Ambulatory Visit | Attending: Specialist | Admitting: Specialist

## 2024-08-20 DIAGNOSIS — R918 Other nonspecific abnormal finding of lung field: Secondary | ICD-10-CM | POA: Diagnosis present

## 2024-08-20 LAB — GLUCOSE, CAPILLARY: Glucose-Capillary: 91 mg/dL (ref 70–99)

## 2024-08-20 MED ORDER — FLUDEOXYGLUCOSE F - 18 (FDG) INJECTION
10.1000 | Freq: Once | INTRAVENOUS | Status: AC | PRN
  Administered 2024-08-20: 10.36 via INTRAVENOUS

## 2024-09-16 ENCOUNTER — Other Ambulatory Visit: Payer: Self-pay | Admitting: Podiatry
